# Patient Record
Sex: Female | Born: 1955 | Race: White | Hispanic: No | Marital: Married | Smoking: Former smoker
Health system: Southern US, Community
[De-identification: ages and names within clinical notes are randomized; demographics above are authoritative.]

## PROBLEM LIST (undated history)

## (undated) DIAGNOSIS — M1990 Unspecified osteoarthritis, unspecified site: Secondary | ICD-10-CM

## (undated) DIAGNOSIS — K219 Gastro-esophageal reflux disease without esophagitis: Secondary | ICD-10-CM

## (undated) DIAGNOSIS — M329 Systemic lupus erythematosus, unspecified: Secondary | ICD-10-CM

## (undated) DIAGNOSIS — M797 Fibromyalgia: Secondary | ICD-10-CM

## (undated) DIAGNOSIS — C801 Malignant (primary) neoplasm, unspecified: Secondary | ICD-10-CM

## (undated) DIAGNOSIS — C50919 Malignant neoplasm of unspecified site of unspecified female breast: Secondary | ICD-10-CM

## (undated) DIAGNOSIS — IMO0002 Reserved for concepts with insufficient information to code with codable children: Secondary | ICD-10-CM

## (undated) DIAGNOSIS — I10 Essential (primary) hypertension: Secondary | ICD-10-CM

## (undated) HISTORY — PX: KNEE SURGERY: SHX244

## (undated) HISTORY — DX: Gastro-esophageal reflux disease without esophagitis: K21.9

## (undated) HISTORY — DX: Essential (primary) hypertension: I10

## (undated) HISTORY — PX: COLONOSCOPY: SHX174

## (undated) HISTORY — DX: Malignant (primary) neoplasm, unspecified: C80.1

## (undated) HISTORY — PX: CATARACT EXTRACTION: SUR2

## (undated) HISTORY — DX: Systemic lupus erythematosus, unspecified: M32.9

## (undated) HISTORY — PX: TUBAL LIGATION: SHX77

## (undated) HISTORY — DX: Fibromyalgia: M79.7

## (undated) HISTORY — PX: SHOULDER SURGERY: SHX246

## (undated) HISTORY — DX: Unspecified osteoarthritis, unspecified site: M19.90

## (undated) HISTORY — DX: Reserved for concepts with insufficient information to code with codable children: IMO0002

---

## 2000-11-25 ENCOUNTER — Other Ambulatory Visit: Admission: RE | Admit: 2000-11-25 | Discharge: 2000-11-25 | Payer: Self-pay | Admitting: *Deleted

## 2002-01-04 ENCOUNTER — Other Ambulatory Visit: Admission: RE | Admit: 2002-01-04 | Discharge: 2002-01-04 | Payer: Self-pay | Admitting: *Deleted

## 2003-01-04 ENCOUNTER — Other Ambulatory Visit: Admission: RE | Admit: 2003-01-04 | Discharge: 2003-01-04 | Payer: Self-pay | Admitting: *Deleted

## 2004-01-31 ENCOUNTER — Other Ambulatory Visit: Admission: RE | Admit: 2004-01-31 | Discharge: 2004-01-31 | Payer: Self-pay | Admitting: *Deleted

## 2005-04-14 ENCOUNTER — Other Ambulatory Visit: Admission: RE | Admit: 2005-04-14 | Discharge: 2005-04-14 | Payer: Self-pay | Admitting: *Deleted

## 2006-05-26 ENCOUNTER — Other Ambulatory Visit: Admission: RE | Admit: 2006-05-26 | Discharge: 2006-05-26 | Payer: Self-pay | Admitting: *Deleted

## 2007-07-13 ENCOUNTER — Other Ambulatory Visit: Admission: RE | Admit: 2007-07-13 | Discharge: 2007-07-13 | Payer: Self-pay | Admitting: *Deleted

## 2008-07-04 ENCOUNTER — Other Ambulatory Visit: Admission: RE | Admit: 2008-07-04 | Discharge: 2008-07-04 | Payer: Self-pay | Admitting: Gynecology

## 2009-07-26 ENCOUNTER — Encounter: Admission: RE | Admit: 2009-07-26 | Discharge: 2009-07-26 | Payer: Self-pay | Admitting: Gynecology

## 2010-12-15 HISTORY — PX: BREAST LUMPECTOMY: SHX2

## 2011-01-06 ENCOUNTER — Encounter: Payer: Self-pay | Admitting: Gynecology

## 2011-02-06 ENCOUNTER — Other Ambulatory Visit: Payer: Self-pay | Admitting: Gynecology

## 2011-02-06 DIAGNOSIS — R928 Other abnormal and inconclusive findings on diagnostic imaging of breast: Secondary | ICD-10-CM

## 2011-02-11 ENCOUNTER — Ambulatory Visit
Admission: RE | Admit: 2011-02-11 | Discharge: 2011-02-11 | Disposition: A | Discharge status: Home / Self Care | Payer: 59 | Source: Ambulatory Visit | Attending: Gynecology | Admitting: Gynecology

## 2011-02-11 ENCOUNTER — Other Ambulatory Visit: Payer: Self-pay | Admitting: Gynecology

## 2011-02-11 DIAGNOSIS — R928 Other abnormal and inconclusive findings on diagnostic imaging of breast: Secondary | ICD-10-CM

## 2011-02-17 ENCOUNTER — Ambulatory Visit
Admission: RE | Admit: 2011-02-17 | Discharge: 2011-02-17 | Disposition: A | Discharge status: Home / Self Care | Payer: 59 | Source: Ambulatory Visit | Attending: Gynecology | Admitting: Gynecology

## 2011-02-17 ENCOUNTER — Other Ambulatory Visit: Payer: Self-pay | Admitting: Radiology

## 2011-02-17 ENCOUNTER — Ambulatory Visit
Admission: RE | Admit: 2011-02-17 | Discharge: 2011-02-17 | Disposition: A | Discharge status: Home / Self Care | Payer: 59 | Source: Ambulatory Visit

## 2011-02-17 ENCOUNTER — Other Ambulatory Visit: Payer: Self-pay | Admitting: Gynecology

## 2011-02-17 DIAGNOSIS — R928 Other abnormal and inconclusive findings on diagnostic imaging of breast: Secondary | ICD-10-CM

## 2011-02-17 DIAGNOSIS — N6320 Unspecified lump in the left breast, unspecified quadrant: Secondary | ICD-10-CM

## 2011-02-18 ENCOUNTER — Other Ambulatory Visit: Payer: Self-pay | Admitting: Gynecology

## 2011-02-18 DIAGNOSIS — C50912 Malignant neoplasm of unspecified site of left female breast: Secondary | ICD-10-CM

## 2011-02-20 ENCOUNTER — Other Ambulatory Visit: Payer: Self-pay | Admitting: Gynecology

## 2011-02-20 DIAGNOSIS — N6320 Unspecified lump in the left breast, unspecified quadrant: Secondary | ICD-10-CM

## 2011-02-21 ENCOUNTER — Ambulatory Visit
Admission: RE | Admit: 2011-02-21 | Discharge: 2011-02-21 | Disposition: A | Discharge status: Home / Self Care | Payer: 59 | Source: Ambulatory Visit | Attending: Gynecology | Admitting: Gynecology

## 2011-02-21 DIAGNOSIS — C50912 Malignant neoplasm of unspecified site of left female breast: Secondary | ICD-10-CM

## 2011-02-21 MED ORDER — GADOBENATE DIMEGLUMINE 529 MG/ML IV SOLN
15.0000 mL | Freq: Once | INTRAVENOUS | Status: AC | PRN
  Administered 2011-02-21: 15 mL via INTRAVENOUS

## 2011-02-26 ENCOUNTER — Encounter (HOSPITAL_BASED_OUTPATIENT_CLINIC_OR_DEPARTMENT_OTHER): Payer: 59 | Admitting: Oncology

## 2011-02-26 ENCOUNTER — Other Ambulatory Visit: Payer: Self-pay | Admitting: Oncology

## 2011-02-26 DIAGNOSIS — M329 Systemic lupus erythematosus, unspecified: Secondary | ICD-10-CM

## 2011-02-26 DIAGNOSIS — C50319 Malignant neoplasm of lower-inner quadrant of unspecified female breast: Secondary | ICD-10-CM

## 2011-02-26 DIAGNOSIS — Z79899 Other long term (current) drug therapy: Secondary | ICD-10-CM

## 2011-02-26 DIAGNOSIS — IMO0001 Reserved for inherently not codable concepts without codable children: Secondary | ICD-10-CM

## 2011-02-26 LAB — COMPREHENSIVE METABOLIC PANEL
Albumin: 3.7 g/dL (ref 3.5–5.2)
Alkaline Phosphatase: 73 U/L (ref 39–117)
BUN: 10 mg/dL (ref 6–23)
Calcium: 8.9 mg/dL (ref 8.4–10.5)
Chloride: 103 mEq/L (ref 96–112)
Glucose, Bld: 119 mg/dL — ABNORMAL HIGH (ref 70–99)
Potassium: 3.5 mEq/L (ref 3.5–5.3)

## 2011-02-26 LAB — CBC WITH DIFFERENTIAL/PLATELET
Basophils Absolute: 0 10*3/uL (ref 0.0–0.1)
Eosinophils Absolute: 0 10*3/uL (ref 0.0–0.5)
HGB: 11.8 g/dL (ref 11.6–15.9)
MCV: 82.1 fL (ref 79.5–101.0)
MONO%: 8.7 % (ref 0.0–14.0)
NEUT#: 1.8 10*3/uL (ref 1.5–6.5)
Platelets: 289 10*3/uL (ref 145–400)
RDW: 15.7 % — ABNORMAL HIGH (ref 11.2–14.5)

## 2011-02-26 LAB — CANCER ANTIGEN 27.29: CA 27.29: 16 U/mL (ref 0–39)

## 2011-02-27 ENCOUNTER — Other Ambulatory Visit (HOSPITAL_COMMUNITY): Payer: Self-pay | Admitting: General Surgery

## 2011-02-27 ENCOUNTER — Other Ambulatory Visit: Payer: Self-pay | Admitting: General Surgery

## 2011-02-27 DIAGNOSIS — C50912 Malignant neoplasm of unspecified site of left female breast: Secondary | ICD-10-CM

## 2011-03-06 ENCOUNTER — Other Ambulatory Visit (HOSPITAL_COMMUNITY): Payer: Self-pay | Admitting: General Surgery

## 2011-03-06 ENCOUNTER — Encounter (HOSPITAL_COMMUNITY)
Admission: RE | Admit: 2011-03-06 | Discharge: 2011-03-06 | Disposition: A | Discharge status: Home / Self Care | Payer: 59 | Source: Ambulatory Visit | Attending: General Surgery | Admitting: General Surgery

## 2011-03-06 DIAGNOSIS — C50912 Malignant neoplasm of unspecified site of left female breast: Secondary | ICD-10-CM

## 2011-03-06 LAB — URINALYSIS, ROUTINE W REFLEX MICROSCOPIC
Glucose, UA: NEGATIVE mg/dL
Nitrite: NEGATIVE
pH: 7.5 (ref 5.0–8.0)

## 2011-03-06 LAB — DIFFERENTIAL
Basophils Absolute: 0 10*3/uL (ref 0.0–0.1)
Basophils Relative: 0 % (ref 0–1)
Neutro Abs: 4.1 10*3/uL (ref 1.7–7.7)
Neutrophils Relative %: 60 % (ref 43–77)

## 2011-03-06 LAB — COMPREHENSIVE METABOLIC PANEL
Albumin: 3.9 g/dL (ref 3.5–5.2)
BUN: 11 mg/dL (ref 6–23)
Chloride: 104 mEq/L (ref 96–112)
Creatinine, Ser: 0.98 mg/dL (ref 0.4–1.2)
Glucose, Bld: 95 mg/dL (ref 70–99)
Total Bilirubin: 0.4 mg/dL (ref 0.3–1.2)
Total Protein: 6.6 g/dL (ref 6.0–8.3)

## 2011-03-06 LAB — URINE MICROSCOPIC-ADD ON

## 2011-03-06 LAB — SURGICAL PCR SCREEN
MRSA, PCR: NEGATIVE
Staphylococcus aureus: NEGATIVE

## 2011-03-06 LAB — CBC
Hemoglobin: 11.8 g/dL — ABNORMAL LOW (ref 12.0–15.0)
RBC: 4.37 MIL/uL (ref 3.87–5.11)
WBC: 6.8 10*3/uL (ref 4.0–10.5)

## 2011-03-10 ENCOUNTER — Other Ambulatory Visit: Payer: Self-pay | Admitting: General Surgery

## 2011-03-10 ENCOUNTER — Ambulatory Visit
Admission: RE | Admit: 2011-03-10 | Discharge: 2011-03-10 | Disposition: A | Discharge status: Home / Self Care | Payer: 59 | Source: Ambulatory Visit | Attending: General Surgery | Admitting: General Surgery

## 2011-03-10 ENCOUNTER — Ambulatory Visit (HOSPITAL_COMMUNITY)
Admission: RE | Admit: 2011-03-10 | Discharge: 2011-03-10 | Disposition: A | Discharge status: Home / Self Care | Payer: 59 | Source: Ambulatory Visit | Attending: General Surgery | Admitting: General Surgery

## 2011-03-10 DIAGNOSIS — M6289 Other specified disorders of muscle: Secondary | ICD-10-CM | POA: Insufficient documentation

## 2011-03-10 DIAGNOSIS — C50912 Malignant neoplasm of unspecified site of left female breast: Secondary | ICD-10-CM

## 2011-03-10 DIAGNOSIS — I10 Essential (primary) hypertension: Secondary | ICD-10-CM | POA: Insufficient documentation

## 2011-03-10 DIAGNOSIS — Z01810 Encounter for preprocedural cardiovascular examination: Secondary | ICD-10-CM | POA: Insufficient documentation

## 2011-03-10 DIAGNOSIS — Z87891 Personal history of nicotine dependence: Secondary | ICD-10-CM | POA: Insufficient documentation

## 2011-03-10 DIAGNOSIS — C50319 Malignant neoplasm of lower-inner quadrant of unspecified female breast: Secondary | ICD-10-CM | POA: Insufficient documentation

## 2011-03-10 DIAGNOSIS — M329 Systemic lupus erythematosus, unspecified: Secondary | ICD-10-CM | POA: Insufficient documentation

## 2011-03-10 DIAGNOSIS — E669 Obesity, unspecified: Secondary | ICD-10-CM | POA: Insufficient documentation

## 2011-03-10 DIAGNOSIS — Z01812 Encounter for preprocedural laboratory examination: Secondary | ICD-10-CM | POA: Insufficient documentation

## 2011-03-10 HISTORY — PX: BREAST SURGERY: SHX581

## 2011-03-10 MED ORDER — TECHNETIUM TC 99M SULFUR COLLOID FILTERED
1.0000 | Freq: Once | INTRAVENOUS | Status: AC | PRN
  Administered 2011-03-10: 1 via INTRADERMAL

## 2011-03-11 NOTE — Op Note (Signed)
NAME:  Deborah Levy, Deborah Levy            ACCOUNT NO.:  [id number]  MEDICAL RECORD NO.:  [id number]           PATIENT TYPE:  O  LOCATION:  NUC                          FACILITY:  [facility]  PHYSICIAN:  Angelia Mould. Derrell Lolling, M.D.DATE OF BIRTH:  08/15/1956  DATE OF PROCEDURE:  03/10/2011 DATE OF DISCHARGE:                              OPERATIVE REPORT   PREOPERATIVE DIAGNOSIS:  Invasive lobular carcinoma, left breast, lower inner quadrant, clinical stage T1b N0.  POSTOPERATIVE DIAGNOSIS:  Invasive lobular carcinoma, left breast, lower inner quadrant, clinical stage T1b N0.  OPERATION PERFORMED: 1. Inject blue dye, left breast. 2. Left partial mastectomy with needle localization. 3. Left axillary sentinel lymph node mapping and biopsy.  SURGEON:  Angelia Mould. Derrell Lolling, MD  OPERATIVE INDICATIONS:  This is a 55 year old Caucasian female who had recent screening mammograms and subsequent other x-rays.  She was found to have a 6-mm density in the left breast in the lower inner quadrant. Image-guided biopsy showed invasive mammary carcinoma with lobular features, ER 97%, PR 59%, Ki-67 of 12%, HER2 negative.  Subsequent MRI showed a 9-mm density in the left breast, lower inner quadrant and this was a solitary finding.  She has been evaluated by me as well as Dr. Darnelle Catalan and Dr. Roselind Messier.  It is felt that she most likely has stage I lobular carcinoma and was felt to be a good candidate for breast conservation.  That was her desire.  It was our recommendation that she go ahead with surgery as the first step and she is brought to the operating room electively for that.  OPERATIVE TECHNIQUE:  The patient underwent wire localization by Dr. Cain Saupe at the [facility] this morning.  The wire entered the left breast at about the 6:30 position and was directed laterally through the marker clip where the cancer is at about the 8 o'clock position.  The patient underwent injection of  radionuclide by the nuclear medicine technician in the holding area.  The patient was brought to the operating room.  General anesthesia was induced. Following an alcohol prep, I injected 5 mL of blue dye into the left breast subareolar area.  This was 2 mL of methylene blue mixed with 3 mL of saline.  The breast was massaged for 5 minutes.  Intravenous antibiotics were given.  The patient was identified as correct patient, correct procedure, and correct site.  The left breast and left axilla were prepped and draped in a sterile fashion.  A marking pen was used to mark a breast incision.  I chose to make a very thin radial ellipse at about the 8 o'clock position of the left breast.  This incision was made and dissection was carried down into the breast tissue around the localizing wire.  We got completely around the wire without too much difficulty.  The specimen was removed and marked with the 6-color margin marker kit.  Specimen mammogram showed the marker was in the center of the specimen and looked good. The specimen was sent to pathology.  The wound was irrigated with saline.  Hemostasis was excellent and achieved with electrocautery.  I closed  the breast tissue carefully in 3 layers, 2 layers of interrupted 3-0 Vicryl and the skin was closed with a running subcuticular suture of 4-0 Monocryl and Dermabond.  Attention was directed to the left axilla.  The NeoProbe was used and we could hear the radioactivity in the axilla.  A transverse incision was made at the hairline.  Dissection was carried down through subcutaneous tissue.  The clavipectoral fascia was incised and we entered the axilla. We traced out 2 sentinel lymph nodes.  Both were very hot with radioactivity but only one had blue dye in them.  Once these were removed, there was almost no radioactivity left and no palpable lymph nodes left.  These were sent for routine histology.  The wound was irrigated with saline.   Hemostasis was excellent.  The deeper tissues were closed with interrupted sutures of 3-0 Vicryl and the skin closed with a running subcuticular suture of 4-0 Monocryl and Dermabond.  The patient tolerated the procedure well and was taken to the recovery room in stable condition.  Estimated blood loss was 20 mL or less. Complications were none.  Sponge, needle, and instrument counts were correct.     Angelia Mould. Derrell Lolling, M.D.     HMI/MEDQ  D:  03/10/2011  T:  03/11/2011  Job:  [id number]  cc:   Stoney Bang, MD Valentino Hue. Magrinat, M.D. Billie Lade, Ph.D., M.D. Aundra Dubin, M.D. Leatha Gilding. Mezer, M.D. Nolon Nations, MD  Electronically Signed by Claud Kelp M.D. on 03/11/2011 09:48:28 AM

## 2011-03-19 ENCOUNTER — Ambulatory Visit: Payer: 59 | Attending: Radiation Oncology | Admitting: Radiation Oncology

## 2011-03-19 DIAGNOSIS — L988 Other specified disorders of the skin and subcutaneous tissue: Secondary | ICD-10-CM | POA: Insufficient documentation

## 2011-03-19 DIAGNOSIS — Y842 Radiological procedure and radiotherapy as the cause of abnormal reaction of the patient, or of later complication, without mention of misadventure at the time of the procedure: Secondary | ICD-10-CM | POA: Insufficient documentation

## 2011-03-19 DIAGNOSIS — Z510 Encounter for antineoplastic radiation therapy: Secondary | ICD-10-CM | POA: Insufficient documentation

## 2011-03-19 DIAGNOSIS — Z170 Estrogen receptor positive status [ER+]: Secondary | ICD-10-CM | POA: Insufficient documentation

## 2011-03-19 DIAGNOSIS — IMO0001 Reserved for inherently not codable concepts without codable children: Secondary | ICD-10-CM | POA: Insufficient documentation

## 2011-03-19 DIAGNOSIS — C50919 Malignant neoplasm of unspecified site of unspecified female breast: Secondary | ICD-10-CM | POA: Insufficient documentation

## 2011-03-21 ENCOUNTER — Other Ambulatory Visit: Payer: Self-pay | Admitting: Rheumatology

## 2011-03-21 DIAGNOSIS — G129 Spinal muscular atrophy, unspecified: Secondary | ICD-10-CM

## 2011-03-24 ENCOUNTER — Ambulatory Visit: Payer: 59 | Admitting: Radiation Oncology

## 2011-04-23 ENCOUNTER — Encounter (INDEPENDENT_AMBULATORY_CARE_PROVIDER_SITE_OTHER): Payer: Self-pay | Admitting: General Surgery

## 2011-05-07 ENCOUNTER — Encounter (HOSPITAL_BASED_OUTPATIENT_CLINIC_OR_DEPARTMENT_OTHER): Payer: 59 | Admitting: Oncology

## 2011-05-07 DIAGNOSIS — Z170 Estrogen receptor positive status [ER+]: Secondary | ICD-10-CM

## 2011-05-07 DIAGNOSIS — IMO0001 Reserved for inherently not codable concepts without codable children: Secondary | ICD-10-CM

## 2011-05-07 DIAGNOSIS — C50319 Malignant neoplasm of lower-inner quadrant of unspecified female breast: Secondary | ICD-10-CM

## 2011-07-07 ENCOUNTER — Ambulatory Visit
Admission: RE | Admit: 2011-07-07 | Discharge: 2011-07-07 | Disposition: A | Discharge status: Home / Self Care | Payer: 59 | Source: Ambulatory Visit | Attending: Radiation Oncology | Admitting: Radiation Oncology

## 2011-09-04 ENCOUNTER — Encounter (HOSPITAL_BASED_OUTPATIENT_CLINIC_OR_DEPARTMENT_OTHER): Payer: 59 | Admitting: Oncology

## 2011-09-04 DIAGNOSIS — Z79899 Other long term (current) drug therapy: Secondary | ICD-10-CM

## 2011-09-04 DIAGNOSIS — IMO0001 Reserved for inherently not codable concepts without codable children: Secondary | ICD-10-CM

## 2011-09-04 DIAGNOSIS — M329 Systemic lupus erythematosus, unspecified: Secondary | ICD-10-CM

## 2011-09-04 DIAGNOSIS — C50319 Malignant neoplasm of lower-inner quadrant of unspecified female breast: Secondary | ICD-10-CM

## 2011-09-04 LAB — CBC WITH DIFFERENTIAL/PLATELET
BASO%: 0.7 % (ref 0.0–2.0)
Basophils Absolute: 0 10*3/uL (ref 0.0–0.1)
Eosinophils Absolute: 0 10*3/uL (ref 0.0–0.5)
HCT: 39.4 % (ref 34.8–46.6)
HGB: 13.3 g/dL (ref 11.6–15.9)
LYMPH%: 27.9 % (ref 14.0–49.7)
MCHC: 33.8 g/dL (ref 31.5–36.0)
MONO#: 0.6 10*3/uL (ref 0.1–0.9)
NEUT%: 61 % (ref 38.4–76.8)
Platelets: 393 10*3/uL (ref 145–400)
WBC: 6.1 10*3/uL (ref 3.9–10.3)
lymph#: 1.7 10*3/uL (ref 0.9–3.3)

## 2011-09-04 LAB — COMPREHENSIVE METABOLIC PANEL
ALT: 15 U/L (ref 0–35)
BUN: 13 mg/dL (ref 6–23)
CO2: 26 mEq/L (ref 19–32)
Calcium: 9.5 mg/dL (ref 8.4–10.5)
Chloride: 99 mEq/L (ref 96–112)
Creatinine, Ser: 0.78 mg/dL (ref 0.50–1.10)
Glucose, Bld: 90 mg/dL (ref 70–99)
Total Bilirubin: 0.1 mg/dL — ABNORMAL LOW (ref 0.3–1.2)

## 2011-09-04 LAB — CANCER ANTIGEN 27.29: CA 27.29: 24 U/mL (ref 0–39)

## 2011-09-11 ENCOUNTER — Encounter: Payer: 59 | Admitting: Oncology

## 2011-09-11 DIAGNOSIS — C50319 Malignant neoplasm of lower-inner quadrant of unspecified female breast: Secondary | ICD-10-CM

## 2011-09-11 DIAGNOSIS — IMO0001 Reserved for inherently not codable concepts without codable children: Secondary | ICD-10-CM

## 2011-09-11 DIAGNOSIS — Z170 Estrogen receptor positive status [ER+]: Secondary | ICD-10-CM

## 2011-09-11 DIAGNOSIS — Z923 Personal history of irradiation: Secondary | ICD-10-CM

## 2012-01-12 ENCOUNTER — Ambulatory Visit
Admission: RE | Admit: 2012-01-12 | Discharge: 2012-01-12 | Disposition: A | Discharge status: Home / Self Care | Payer: 59 | Source: Ambulatory Visit | Attending: Radiation Oncology | Admitting: Radiation Oncology

## 2012-01-12 VITALS — BP 133/89 | HR 108 | Temp 97.5°F | Wt 180.3 lb

## 2012-01-12 DIAGNOSIS — C50912 Malignant neoplasm of unspecified site of left female breast: Secondary | ICD-10-CM

## 2012-01-12 DIAGNOSIS — C50312 Malignant neoplasm of lower-inner quadrant of left female breast: Secondary | ICD-10-CM

## 2012-01-12 NOTE — Progress Notes (Signed)
CC:   Angelia Mould. Derrell Lolling, M.D. Lowella Dell, M.D. Aundra Dubin, M.D.  FOLLOWUP NOTE  DIAGNOSIS:  Left breast cancer.  INTERVAL SINCE RADIATION THERAPY:  7 months.  NARRATIVE:  Deborah Levy comes in today for routine followup.  She completed breast conservation therapy back in June 2012.  The patient denies any pain within the breast area, nipple discharge or bleeding. She continues on tamoxifen and seems to be tolerating this well.  The patient has been taken off of her Plaquenil as her rheumatologist Dr. Kellie Simmering, feels overall this disease process is better.  The patient denies any more episodes of severe pruritus.  The patient denies any problems with swelling in her left arm or hand.  PHYSICAL EXAMINATION:  Vital Signs:  The patient's temperature is 97.5, pulse is 108, blood pressure is 133/89.  Weight is 180.3 pounds.  The patient is accompanied by her husband on evaluation today.  Examination of the neck and supraclavicular region reveals no evidence of adenopathy.  The axillary areas are free of adenopathy.  Examination of the lungs reveals them to be clear.  The heart has a regular rhythm and rate.  The abdomen is soft and nontender with normal bowel sounds. Examination of the right breast reveals no mass or nipple discharge. Examination left breast reveals very mild hyperpigmentation changes. There is no dominant mass appreciated in the breast, nipple discharge or bleeding.  The patient has some mild edema in the nipple-areolar complex area on lying flat.  IMPRESSION AND PLAN:  Clinically no evidence of disease.  In light of the patient's close followup with Dr. Darnelle Catalan, I have not scheduled Mrs. Murrell for formal followup appointment but would be glad to see her at any time.    ______________________________ Billie Lade, Ph.D., M.D. JDK/MEDQ  D:  01/12/2012  T:  01/12/2012  Job:  [id number]

## 2012-01-12 NOTE — Progress Notes (Signed)
Here for follow up 7 mths post radiation  To left breast.Has occasional discoloration under left mammary fold. No mammogram since surgery.

## 2012-02-13 ENCOUNTER — Telehealth: Payer: Self-pay | Admitting: Oncology

## 2012-02-13 NOTE — Telephone Encounter (Signed)
Called pt ,left message regarding lab and ML visit on march 2013

## 2012-02-23 ENCOUNTER — Encounter (INDEPENDENT_AMBULATORY_CARE_PROVIDER_SITE_OTHER): Payer: Self-pay | Admitting: General Surgery

## 2012-02-23 ENCOUNTER — Ambulatory Visit (INDEPENDENT_AMBULATORY_CARE_PROVIDER_SITE_OTHER): Payer: 59 | Admitting: General Surgery

## 2012-02-23 VITALS — BP 120/76 | HR 108 | Temp 97.2°F | Ht 59.5 in | Wt 180.4 lb

## 2012-02-23 DIAGNOSIS — C50919 Malignant neoplasm of unspecified site of unspecified female breast: Secondary | ICD-10-CM

## 2012-02-23 DIAGNOSIS — C50912 Malignant neoplasm of unspecified site of left female breast: Secondary | ICD-10-CM

## 2012-02-23 NOTE — Progress Notes (Signed)
Patient ID: Deborah Levy, female   DOB: Feb 21, 1956, 56 y.o.   MRN: [id number]  Chief Complaint  Patient presents with  . Follow-up    br ck after mgm     HPI Deborah Levy is a 56 y.o. female.  She returns for long-term followup regarding her left breast cancer, lower inner quadrant.  On March 10, 2011 the patient underwent left partial mastectomy and sentinel node biopsy for breast cancer in the lower inner quadrant of the left breast. Pathologic stage T1a., N0, invasive lobular carcinoma, 0.5 cm, zero out of two nodes positive. Receptors strongly positive, Ki-67 12%, HER-2 negative.  She had adjuvant radiation therapy. Dr. Darnelle Catalan put her on tamoxifen and is following her periodically.  She has no complaints about her breast. She feels no nodules or painful areas. Occasional brief neuropathic pain is reported.  She has not had mammograms since her surgery and she knows that she is due for that.  She is still contemplating reduction mammoplasty on the right as a symmetry procedure but she has not sought plastic surgical consultation yet. HPI  Past Medical History  Diagnosis Date  . Lupus   . Fibromyalgia   . Hypertension   . Cancer   . GERD (gastroesophageal reflux disease)     Past Surgical History  Procedure Date  . Shoulder surgery   . Knee surgery   . Tubal ligation   . Breast surgery 03/10/2011    Lt br lumpectomy    Family History  Problem Relation Age of Onset  . Cancer Father     colon  . Hypertension Father   . Heart disease Father   . Cancer Sister     Cervical  . Heart disease Maternal Grandfather   . Hypertension Paternal Grandmother   . Heart disease Paternal Grandmother     Social History History  Substance Use Topics  . Smoking status: Former Games developer  . Smokeless tobacco: Former Neurosurgeon    Quit date: 02/22/1997  . Alcohol Use: No    Allergies  Allergen Reactions  . Sulfa Antibiotics Nausea Only    Current Outpatient Prescriptions    Medication Sig Dispense Refill  . aspirin 81 MG tablet Take 81 mg by mouth daily.      . Biotin 5000 MCG CAPS Take 1 capsule by mouth daily.        . Calcium Carbonate-Vitamin D (CALCIUM 600 + D PO) Take 1 tablet by mouth 2 (two) times daily.        Marland Kitchen docusate sodium (STOOL SOFTENER) 100 MG capsule Take 100 mg by mouth daily.        Marland Kitchen ezetimibe (ZETIA) 10 MG tablet Take 10 mg by mouth daily.      . fexofenadine (ALLEGRA) 60 MG tablet Take 60 mg by mouth 2 (two) times daily.        . hydrOXYzine (ATARAX) 25 MG tablet Take 25 mg by mouth 2 (two) times daily.        Marland Kitchen lisinopril (PRINIVIL,ZESTRIL) 10 MG tablet Take 10 mg by mouth daily.        . magnesium oxide (MAG-OX) 400 MG tablet Take 400 mg by mouth 2 (two) times daily.        . Niacin-Simvastatin [facility]) 1000-40 MG TB24 Take 1 tablet by mouth daily.        Marland Kitchen omeprazole (PRILOSEC) 40 MG capsule       . pregabalin (LYRICA) 100 MG capsule Take 100 mg by mouth.      Marland Kitchen  simvastatin (ZOCOR) 40 MG tablet       . tamoxifen (NOLVADEX) 20 MG tablet       . venlafaxine (EFFEXOR-XR) 37.5 MG 24 hr capsule       . zolpidem (AMBIEN) 10 MG tablet Take 10 mg by mouth at bedtime.          Review of Systems Review of Systems  Constitutional: Negative for fever, chills and unexpected weight change.  HENT: Negative for hearing loss, congestion, sore throat, trouble swallowing and voice change.   Eyes: Negative for visual disturbance.  Respiratory: Negative for cough and wheezing.   Cardiovascular: Negative for chest pain, palpitations and leg swelling.  Gastrointestinal: Negative for nausea, vomiting, abdominal pain, diarrhea, constipation, blood in stool, abdominal distention and anal bleeding.  Genitourinary: Negative for hematuria, vaginal bleeding and difficulty urinating.  Musculoskeletal: Negative for arthralgias.  Skin: Negative for rash and wound.  Neurological: Negative for seizures, syncope and headaches.  Hematological: Negative for  adenopathy. Does not bruise/bleed easily.  Psychiatric/Behavioral: Negative for confusion.    Blood pressure 120/76, pulse 108, temperature 97.2 F (36.2 C), temperature source Temporal, height 4' 11.5" (1.511 m), weight 180 lb 6.4 oz (81.829 kg), SpO2 97.00%.  Physical Exam Physical Exam  Constitutional: She is oriented to person, place, and time. She appears well-developed and well-nourished. No distress.  HENT:  Head: Atraumatic.  Eyes: EOM are normal.  Neck: Neck supple. No JVD present. No tracheal deviation present. No thyromegaly present.  Cardiovascular: Normal rate, regular rhythm, normal heart sounds and intact distal pulses.   No murmur heard. Pulmonary/Chest: Effort normal and breath sounds normal. No respiratory distress. She has no wheezes. She has no rales. She exhibits no tenderness.    Abdominal: Bowel sounds are normal.  Musculoskeletal: She exhibits no edema and no tenderness.  Lymphadenopathy:    She has no cervical adenopathy.  Neurological: She is alert and oriented to person, place, and time. She exhibits normal muscle tone. Coordination normal.  Skin: Skin is warm. No rash noted. She is not diaphoretic. No erythema. No pallor.  Psychiatric: She has a normal mood and affect. Her behavior is normal. Judgment and thought content normal.    Data Reviewed I reviewed all of my old records and I reviewed the records from the [facility]. She has been scheduled for mammograms.  Assessment    Invasive lobular carcinoma left breast, lower inner quadrant, pathologic stage T1a, , N0, receptor positive, HER-2 negative, Ki67 12%.  No evidence of recurrence one year following left partial mastectomy and sentinel biopsy, adjuvant radiation therapy and ongoing adjuvant tamoxifen.  Mild asymmetry with left breast smaller than right.    Plan    We discussed symmetry procedures. Specifically, she is interested in  reduction mammoplasty on the right. She was  not willing for me to go ahead and make a referral, but I gave her the names of the plastic surgeons to do this type of surgery and encouraged to call for a consultative visit.  She'll be scheduled for bilateral diagnostic mammograms now.  Bilateral diagnostic mammograms will be repeated in one year, and she will see me at that time.  Continue tamoxifen and followup with Dr. Darnelle Catalan.       Angelia Mould. Derrell Lolling, M.D., [facility] General and Minimally invasive Surgery Breast and Colorectal Surgery Office:   [phone] Pager:   [phone]  02/23/2012, 4:14 PM

## 2012-02-23 NOTE — Patient Instructions (Signed)
Your breast exam today is normal. There is no evidence of recurrent cancer anywhere.  You have been given the names of plastic surgeons who do symmetry procedures for breast cancer. I would encourage you to think about this and call and make an appointment when you feel ready to do this.  You'll be scheduled for bilateral mammograms now, since it has been more than one year since your last mammograms.  Return to see Dr. Derrell Lolling in 13 months after you get mammograms in March of 2014.

## 2012-03-05 ENCOUNTER — Other Ambulatory Visit: Payer: 59 | Admitting: Lab

## 2012-03-05 ENCOUNTER — Telehealth (INDEPENDENT_AMBULATORY_CARE_PROVIDER_SITE_OTHER): Payer: Self-pay

## 2012-03-05 DIAGNOSIS — IMO0001 Reserved for inherently not codable concepts without codable children: Secondary | ICD-10-CM

## 2012-03-05 DIAGNOSIS — C50319 Malignant neoplasm of lower-inner quadrant of unspecified female breast: Secondary | ICD-10-CM

## 2012-03-05 DIAGNOSIS — Z170 Estrogen receptor positive status [ER+]: Secondary | ICD-10-CM

## 2012-03-05 DIAGNOSIS — M329 Systemic lupus erythematosus, unspecified: Secondary | ICD-10-CM

## 2012-03-05 LAB — COMPREHENSIVE METABOLIC PANEL
ALT: 13 U/L (ref 0–35)
AST: 19 U/L (ref 0–37)
Albumin: 4 g/dL (ref 3.5–5.2)
BUN: 15 mg/dL (ref 6–23)
CO2: 27 mEq/L (ref 19–32)
Calcium: 9.1 mg/dL (ref 8.4–10.5)
Chloride: 104 mEq/L (ref 96–112)
Potassium: 4 mEq/L (ref 3.5–5.3)

## 2012-03-05 LAB — CBC WITH DIFFERENTIAL/PLATELET
BASO%: 0.5 % (ref 0.0–2.0)
Basophils Absolute: 0 10*3/uL (ref 0.0–0.1)
EOS%: 1.1 % (ref 0.0–7.0)
HCT: 36.2 % (ref 34.8–46.6)
HGB: 12.5 g/dL (ref 11.6–15.9)
MCH: 29.1 pg (ref 25.1–34.0)
MONO#: 0.3 10*3/uL (ref 0.1–0.9)
RDW: 13.9 % (ref 11.2–14.5)
WBC: 3.4 10*3/uL — ABNORMAL LOW (ref 3.9–10.3)
lymph#: 1.1 10*3/uL (ref 0.9–3.3)

## 2012-03-05 LAB — CANCER ANTIGEN 27.29: CA 27.29: 24 U/mL (ref 0–39)

## 2012-03-05 NOTE — Telephone Encounter (Signed)
I noted in epic that pt did not go for mgm 3-19. I called pt and she states she forgot the appt. I gave her the # to [facility] and pt states she will call to r/s appt.

## 2012-03-10 ENCOUNTER — Encounter: Payer: Self-pay | Admitting: Physician Assistant

## 2012-03-10 ENCOUNTER — Ambulatory Visit (HOSPITAL_BASED_OUTPATIENT_CLINIC_OR_DEPARTMENT_OTHER): Payer: 59 | Admitting: Physician Assistant

## 2012-03-10 ENCOUNTER — Telehealth: Payer: Self-pay | Admitting: *Deleted

## 2012-03-10 VITALS — BP 145/92 | HR 87 | Temp 99.0°F | Ht 59.5 in | Wt 179.4 lb

## 2012-03-10 DIAGNOSIS — Z79810 Long term (current) use of selective estrogen receptor modulators (SERMs): Secondary | ICD-10-CM

## 2012-03-10 DIAGNOSIS — IMO0001 Reserved for inherently not codable concepts without codable children: Secondary | ICD-10-CM

## 2012-03-10 DIAGNOSIS — C50919 Malignant neoplasm of unspecified site of unspecified female breast: Secondary | ICD-10-CM

## 2012-03-10 DIAGNOSIS — C50912 Malignant neoplasm of unspecified site of left female breast: Secondary | ICD-10-CM

## 2012-03-10 DIAGNOSIS — Z170 Estrogen receptor positive status [ER+]: Secondary | ICD-10-CM

## 2012-03-10 MED ORDER — TAMOXIFEN CITRATE 20 MG PO TABS: 20.0000 mg | ORAL_TABLET | Freq: Every day | ORAL | Status: DC

## 2012-03-10 MED ORDER — VENLAFAXINE HCL ER 37.5 MG PO CP24: 75.0000 mg | ORAL_CAPSULE | Freq: Every day | ORAL | Status: DC

## 2012-03-10 NOTE — Telephone Encounter (Signed)
patient called in and confirmed new date and time

## 2012-03-10 NOTE — Progress Notes (Signed)
ID: Deborah Levy   DOB: Apr 25, 1956  MR#: [id number]  WUJ#:[id number]  HISTORY OF PRESENT ILLNESS: [facility], which showed a possible asymmetry in the left breast.  She was recalled for additional views July 26, 2009 and these were felt to be most likely benign findings, as they appeared essentially unchanged as compared to mammography from 2007.  However, she was set up for six-month followup and on February 11, 2011, with repeat left mammogram and ultrasound, Dr. Deboraha Levy was able to locate a small spiculated mass in the left lower inner quadrant posteriorly, it was not palpable by physical exam, but ultrasound did show an irregularly marginated solid mass measuring a maximum of 6 mm.  The axilla was negative.  This was felt sufficiently suspicious that the patient was called back for biopsy and this was performed on March 5.  The pathology [id number]) showed an invasive lobular carcinoma grade 2 with no HER2/neu amplification, but strongly estrogen and progesterone receptor positive at 97% and 59% respectively.  The MIB-1 was low at 12%.  With this information the patient was referred for bilateral breast MRIs, and these were performed March 9.  The MRI confirmed a mass measuring 9 mm in the lower inner quadrant of the left breast, irregular and enhancing.  There were no other masses and no suspicious lymph nodes.    The patient underwent left lumpectomy and sentinel lymph node sampling in March 2012 for her invasive lobular breast carcinoma, T1 a N0, grade 1. Tumor was strongly ER and PR positive, HER-2/neu negative, with an MIB-1 of 12%. Margins were ample.  Patient received radiation therapy which was completed in June of 2012. She began on tamoxifen in July of 2012 and continues with good tolerance.  INTERVAL HISTORY: Deborah Levy returns today for routine six-month followup of her left breast carcinoma. Interval history is generally  unremarkable, and she is tolerating the tamoxifen well. She continues to have hot flashes, and in fact this is her biggest complaint today. She is currently on venlafaxine Bexxar 37.5 mg daily. The hot flashes occur both during the day and at night.  REVIEW OF SYSTEMS: Deborah Levy continues to complain of fatigue, but she attributes this more to her fibromyalgia than the breast cancer which is likely accurate. She sometimes has difficulty sleeping. She has some occasional shooting pains in the surgical area of the left breast. No additional pain elsewhere. No abnormal headaches or dizziness. She feels a little forgetful at times. She's had no recent illnesses, and denies any fevers, chills, or night sweats.  A detailed review of systems is otherwise noncontributory.   PAST MEDICAL HISTORY: Past Medical History  Diagnosis Date  . Lupus   . Fibromyalgia   . Hypertension   . Cancer   . GERD (gastroesophageal reflux disease)     PAST SURGICAL HISTORY: Past Surgical History  Procedure Date  . Shoulder surgery   . Knee surgery   . Tubal ligation   . Breast surgery 03/10/2011    Lt br lumpectomy    FAMILY HISTORY Family History  Problem Relation Age of Onset  . Cancer Father     colon  . Hypertension Father   . Heart disease Father   . Cancer Sister     Cervical  . Heart disease Maternal Grandfather   . Hypertension Paternal Grandmother   . Heart disease Paternal Grandmother     GYNECOLOGIC HISTORY:  She had  menarche in her teens.  Last menstrual period about age 62.  She did use hormone replacement, did so for about 15 years and stopped with her breast cancer diagnosis. She was 56 years old when she birthed her first child.  She is Gx, P1.  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  She has been married eight years to [employer], who works at a Dance movement psychotherapist.  The patient's daughter from a prior marriage is Deborah Levy 36, lives in [place] and works as  a Metallurgist.  The patient has two step children.  They are Deborah Levy's children from a prior marriage.  One of them is Research scientist (physical sciences) who works as a Engineer, civil (consulting) with Dr. Felipa Eth here in [place] and is 58 years old and Deborah Levy who lives in [place], is 25, and works in Psychologist, educational.  They have no grandchildren.  The patient is not a church attender.   ADVANCED DIRECTIVES:  HEALTH MAINTENANCE: History  Substance Use Topics  . Smoking status: Former Games developer  . Smokeless tobacco: Former Neurosurgeon    Quit date: 02/22/1997  . Alcohol Use: No     Colonoscopy:  PAP:  Bone density:  Lipid panel:  Allergies  Allergen Reactions  . Sulfa Antibiotics Nausea Only    Current Outpatient Prescriptions  Medication Sig Dispense Refill  . aspirin 81 MG tablet Take 81 mg by mouth daily.      . Biotin 5000 MCG CAPS Take 1 capsule by mouth daily.        . Calcium Carbonate-Vitamin D (CALCIUM 600 + D PO) Take 1 tablet by mouth 2 (two) times daily.        . cholecalciferol (VITAMIN D) 400 UNITS TABS Take 2,000 Units by mouth daily.      Marland Kitchen docusate sodium (STOOL SOFTENER) 100 MG capsule Take 100 mg by mouth daily.        Marland Kitchen ezetimibe (ZETIA) 10 MG tablet Take 10 mg by mouth daily.      Marland Kitchen lisinopril (PRINIVIL,ZESTRIL) 10 MG tablet Take 10 mg by mouth daily.        . magnesium oxide (MAG-OX) 400 MG tablet Take 400 mg by mouth 2 (two) times daily.        Marland Kitchen omeprazole (PRILOSEC) 40 MG capsule       . phentermine 37.5 MG capsule Take 37.5 mg by mouth every morning.      . pregabalin (LYRICA) 100 MG capsule Take 100 mg by mouth.      . simvastatin (ZOCOR) 40 MG tablet       . tamoxifen (NOLVADEX) 20 MG tablet Take 1 tablet (20 mg total) by mouth daily.  90 tablet  3  . venlafaxine (EFFEXOR-XR) 37.5 MG 24 hr capsule Take 2 capsules (75 mg total) by mouth daily.  30 capsule  5  . zolpidem (AMBIEN) 10 MG tablet Take 10 mg by mouth at bedtime.        . fexofenadine (ALLEGRA) 60 MG tablet Take 60 mg by mouth 2 (two) times  daily.        . hydrOXYzine (ATARAX) 25 MG tablet Take 25 mg by mouth 2 (two) times daily.        . Niacin-Simvastatin [facility]) 1000-40 MG TB24 Take 1 tablet by mouth daily.          OBJECTIVE: Filed Vitals:   03/10/12 1403  BP: 145/92  Pulse: 87  Temp: 99 F (37.2 C)     Body mass index is 35.63  kg/(m^2).    ECOG FS: 0  Sclerae unicteric Oropharynx clear No peripheral adenopathy Lungs no rales or rhonchi, clear to auscultation bilaterally  Heart regular rate and rhythm, no murmurs or drops  Abd benign, soft, nontender, with positive bowel sounds Skin is benign  MSK no focal spinal tenderness, no peripheral edema Neuro: nonfocal Breasts: Right breast is unremarkable, no masses, skin changes, or nipple inversion. Left breast is status post lumpectomy. No suspicious nodularity or skin changes, and no evidence of local recurrence.   LAB RESULTS: Lab Results  Component Value Date   WBC 3.4* 03/05/2012   NEUTROABS 1.9 03/05/2012   HGB 12.5 03/05/2012   HCT 36.2 03/05/2012   MCV 84.4 03/05/2012   PLT 258 03/05/2012      Chemistry      Component Value Date/Time   NA 140 03/05/2012 1107   K 4.0 03/05/2012 1107   CL 104 03/05/2012 1107   CO2 27 03/05/2012 1107   BUN 15 03/05/2012 1107   CREATININE 0.96 03/05/2012 1107      Component Value Date/Time   CALCIUM 9.1 03/05/2012 1107   ALKPHOS 67 03/05/2012 1107   AST 19 03/05/2012 1107   ALT 13 03/05/2012 1107   BILITOT 0.3 03/05/2012 1107       Lab Results  Component Value Date   LABCA2 24 03/05/2012     STUDIES: Patient is scheduled for her next mammogram tomorrow, 03/11/2012.   ASSESSMENT: A 56 year old [place] woman with a history of invasive lobular breast cancer   (1)  status post left lumpectomy and sentinel lymph node sampling March 2012 for a T1A N0 grade 1 tumor strongly estrogen and progesterone receptor positive, with an MIB-1 of 12%, no HER-2 amplification and ample margins,   (2)  status post radiation completed  in June 2012,   (3)  on tamoxifen since July 2012 with good tolerance, the plan being to continue for 5 years.  (4)  comorbidities include fibromyalgia and discoid lupus.    PLAN: With regards to her breast cancer, Deborah Levy appears to be doing well, and there is no clinical evidence of disease recurrence. She will continue on the tamoxifen which I have refilled for her today for another year. Again the goal be to continue for a total of 5 years. We are increasing her Effexor XR from 37.5-75 mg daily to see if this helps her hot flashes. If not, I have asked her to contact us for further evaluation.  Deborah Levy will return for routine followup in 6 months, but knows to call prior that time with any changes or problems.  Since she had a lobular breast cancer, we should consider MRI of the breast every 2 years.  Her next one therefore would be in March 2014.     Deborah Levy    03/10/2012

## 2012-03-10 NOTE — Telephone Encounter (Signed)
gave patient appointment for 08-2012 printed out calendar and gave to the patient 

## 2012-03-11 ENCOUNTER — Ambulatory Visit
Admission: RE | Admit: 2012-03-11 | Discharge: 2012-03-11 | Disposition: A | Discharge status: Home / Self Care | Payer: 59 | Source: Ambulatory Visit | Attending: General Surgery | Admitting: General Surgery

## 2012-03-11 DIAGNOSIS — C50912 Malignant neoplasm of unspecified site of left female breast: Secondary | ICD-10-CM

## 2012-04-06 ENCOUNTER — Other Ambulatory Visit: Payer: Self-pay | Admitting: Gynecology

## 2012-05-03 ENCOUNTER — Telehealth: Payer: Self-pay | Admitting: *Deleted

## 2012-05-03 NOTE — Telephone Encounter (Signed)
Pt. Calls to say the venlafaxine that Amy Allyson Sabal PA gave her for hot flashes is not working at all and her hot flashes are very problematic for her.  She would like to see if Amy could give her something else.  She uses [facility] on [address]  Pt. Call back is [phone]

## 2012-05-04 ENCOUNTER — Telehealth (INDEPENDENT_AMBULATORY_CARE_PROVIDER_SITE_OTHER): Payer: Self-pay

## 2012-05-04 NOTE — Telephone Encounter (Signed)
I returned pts call. Pt advised of Dr Odis Luster and Dr Kelly Splinter for plastic MDs. Pt will check with her insurance to see which MD is with her plan.

## 2012-05-05 ENCOUNTER — Other Ambulatory Visit: Payer: Self-pay | Admitting: *Deleted

## 2012-05-05 MED ORDER — MEGESTROL ACETATE 40 MG PO TABS: ORAL_TABLET | ORAL | Status: DC

## 2012-05-05 NOTE — Telephone Encounter (Signed)
This RN spoke with pt per prescription for hot flashes.

## 2012-08-24 ENCOUNTER — Other Ambulatory Visit: Payer: Self-pay | Admitting: *Deleted

## 2012-08-24 DIAGNOSIS — C50319 Malignant neoplasm of lower-inner quadrant of unspecified female breast: Secondary | ICD-10-CM

## 2012-08-24 MED ORDER — MEGESTROL ACETATE 40 MG PO TABS: ORAL_TABLET | ORAL | Status: DC

## 2012-09-06 ENCOUNTER — Other Ambulatory Visit (HOSPITAL_BASED_OUTPATIENT_CLINIC_OR_DEPARTMENT_OTHER): Payer: BC Managed Care – PPO | Admitting: Lab

## 2012-09-06 DIAGNOSIS — C50919 Malignant neoplasm of unspecified site of unspecified female breast: Secondary | ICD-10-CM

## 2012-09-06 DIAGNOSIS — C50912 Malignant neoplasm of unspecified site of left female breast: Secondary | ICD-10-CM

## 2012-09-06 LAB — COMPREHENSIVE METABOLIC PANEL (CC13)
AST: 19 U/L (ref 5–34)
Albumin: 3.5 g/dL (ref 3.5–5.0)
Alkaline Phosphatase: 56 U/L (ref 40–150)
Potassium: 3.6 mEq/L (ref 3.5–5.1)
Sodium: 139 mEq/L (ref 136–145)
Total Bilirubin: 0.2 mg/dL (ref 0.20–1.20)
Total Protein: 6.5 g/dL (ref 6.4–8.3)

## 2012-09-06 LAB — CBC WITH DIFFERENTIAL/PLATELET
BASO%: 0.9 % (ref 0.0–2.0)
EOS%: 1.3 % (ref 0.0–7.0)
HGB: 11.9 g/dL (ref 11.6–15.9)
MCH: 28.8 pg (ref 25.1–34.0)
MCHC: 33.3 g/dL (ref 31.5–36.0)
RBC: 4.12 10*6/uL (ref 3.70–5.45)
RDW: 13.8 % (ref 11.2–14.5)
lymph#: 1.8 10*3/uL (ref 0.9–3.3)

## 2012-09-07 LAB — CANCER ANTIGEN 27.29: CA 27.29: 21 U/mL (ref 0–39)

## 2012-09-09 ENCOUNTER — Telehealth: Payer: Self-pay | Admitting: Oncology

## 2012-09-09 NOTE — Telephone Encounter (Signed)
S/w the pt and she is aware of her r/s sept appt to dec due to a change in the md's schedule

## 2012-09-13 ENCOUNTER — Ambulatory Visit: Payer: 59 | Admitting: Oncology

## 2012-09-28 ENCOUNTER — Other Ambulatory Visit: Payer: Self-pay | Admitting: Gynecology

## 2012-09-28 DIAGNOSIS — N823 Fistula of vagina to large intestine: Secondary | ICD-10-CM

## 2012-10-04 ENCOUNTER — Ambulatory Visit
Admission: RE | Admit: 2012-10-04 | Discharge: 2012-10-04 | Disposition: A | Discharge status: Home / Self Care | Payer: BC Managed Care – PPO | Source: Ambulatory Visit | Attending: Gynecology | Admitting: Gynecology

## 2012-10-04 ENCOUNTER — Other Ambulatory Visit: Payer: Self-pay | Admitting: Gynecology

## 2012-10-04 DIAGNOSIS — N823 Fistula of vagina to large intestine: Secondary | ICD-10-CM

## 2012-10-04 MED ORDER — GADOBENATE DIMEGLUMINE 529 MG/ML IV SOLN
17.0000 mL | Freq: Once | INTRAVENOUS | Status: AC | PRN
  Administered 2012-10-04: 17 mL via INTRAVENOUS

## 2012-11-15 ENCOUNTER — Other Ambulatory Visit: Payer: Self-pay | Admitting: Oncology

## 2012-11-15 ENCOUNTER — Telehealth: Payer: Self-pay | Admitting: *Deleted

## 2012-11-15 ENCOUNTER — Ambulatory Visit (HOSPITAL_BASED_OUTPATIENT_CLINIC_OR_DEPARTMENT_OTHER): Payer: BC Managed Care – PPO | Admitting: Oncology

## 2012-11-15 VITALS — BP 155/85 | HR 111 | Temp 97.6°F | Resp 20 | Ht 59.5 in | Wt 186.3 lb

## 2012-11-15 DIAGNOSIS — C50319 Malignant neoplasm of lower-inner quadrant of unspecified female breast: Secondary | ICD-10-CM

## 2012-11-15 DIAGNOSIS — C50912 Malignant neoplasm of unspecified site of left female breast: Secondary | ICD-10-CM

## 2012-11-15 DIAGNOSIS — R5381 Other malaise: Secondary | ICD-10-CM

## 2012-11-15 DIAGNOSIS — Z170 Estrogen receptor positive status [ER+]: Secondary | ICD-10-CM

## 2012-11-15 MED ORDER — TAMOXIFEN CITRATE 20 MG PO TABS: 20.0000 mg | ORAL_TABLET | Freq: Every day | ORAL | Status: DC

## 2012-11-15 NOTE — Telephone Encounter (Signed)
Gave patient appointment for 2014 

## 2012-11-15 NOTE — Progress Notes (Signed)
ID: Thad Ranger Wolfley   DOB: 11-19-1956  MR#: [id number]  WUJ#:[id number]  PCP: Garth Schlatter, MD GYN: Teodora Medici MD SU: OTHER MDs:  HISTORY OF PRESENT ILLNESS: Mecca Guitron [facility], which showed a possible asymmetry in the left breast.  She was recalled for additional views July 26, 2009 and these were felt to be most likely benign findings, as they appeared essentially unchanged as compared to mammography from 2007.  However, she was set up for six-month followup and on February 11, 2011, with repeat left mammogram and ultrasound, Dr. [name] was able to locate a small spiculated mass in the left lower inner quadrant posteriorly, it was not palpable by physical exam, but ultrasound did show an irregularly marginated solid mass measuring a maximum of 6 mm.  The axilla was negative.  This was felt sufficiently suspicious that the patient was called back for biopsy and this was performed on March 5.  The pathology [id number]) showed an invasive lobular carcinoma grade 2 with no HER2/neu amplification, but strongly estrogen and progesterone receptor positive at 97% and 59% respectively.  The MIB-1 was low at 12%.  With this information the patient was referred for bilateral breast MRIs, and these were performed March 9.  The MRI confirmed a mass measuring 9 mm in the lower inner quadrant of the left breast, irregular and enhancing.  There were no other masses and no suspicious lymph nodes.    The patient underwent left lumpectomy and sentinel lymph node sampling in March 2012 for her invasive lobular breast carcinoma, T1 a N0, grade 1. Tumor was strongly ER and PR positive, HER-2/neu negative, with an MIB-1 of 12%. Margins were ample. Her subsequent history is as detailed below.   INTERVAL HISTORY: Deborah Levy returns today for routine followup of her breast cancer. The interval history is generally stable. She continues to have problems related to  her lupus, and her remote history of rectovaginal fistula.  REVIEW OF SYSTEMS: She feels severely fatigued, and doing something as simple as washing dishes can cause her to sweat. She is very deconditioned, and feels terrible about her weight gain. She is embarrassed to go to the gym. She continues to have a throbbing pains intermittently, particularly involving the knees. She has urinary frequency and dribbling, and in addition occasional gas through the vagina, which she interprets as a recurrence of her fistula, although MRI in September was unable to demonstrate this. Of course she has her lupus rash, and joint pains related to her fibromyalgia. She feels forgetful and depressed, but not suicidal. Hot flashes are moderate, and are better on a half a tablet of megestrol twice daily. A detailed review of systems otherwise was stable.   PAST MEDICAL HISTORY: Past Medical History  Diagnosis Date  . Lupus   . Fibromyalgia   . Hypertension   . Cancer   . GERD (gastroesophageal reflux disease)     PAST SURGICAL HISTORY: Past Surgical History  Procedure Date  . Shoulder surgery   . Knee surgery   . Tubal ligation   . Breast surgery 03/10/2011    Lt br lumpectomy    FAMILY HISTORY Family History  Problem Relation Age of Onset  . Cancer Father     colon  . Hypertension Father   . Heart disease Father   . Cancer Sister     Cervical  . Heart disease Maternal Grandfather   . Hypertension Paternal Grandmother   .  Heart disease Paternal Grandmother     GYNECOLOGIC HISTORY:  She had menarche in her teens.  Last menstrual period about age 24.  She did use hormone replacement, did so for about 15 years and stopped with her breast cancer diagnosis. She was 56 years old when she birthed her first child.  She is Gx, P1.  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  She has been married eight years to [employer], who works at a Dance movement psychotherapist.  The patient's  daughter from a prior marriage is [name] , who lives in [place] and works as a Metallurgist.  The patient has two step children.  They are Ray's children from a prior marriage.  One of them is Research scientist (physical sciences) who works as a Engineer, civil (consulting) with Dr. Felipa Eth here in [place] who lives in [place] and works in Psychologist, educational.  They have no grandchildren.  The patient is not a church attender.   ADVANCED DIRECTIVES:  HEALTH MAINTENANCE: History  Substance Use Topics  . Smoking status: Former Games developer  . Smokeless tobacco: Former Neurosurgeon    Quit date: 02/22/1997  . Alcohol Use: No     Colonoscopy:  PAP:  Bone density:  Lipid panel:  Allergies  Allergen Reactions  . Sulfa Antibiotics Nausea Only    Current Outpatient Prescriptions  Medication Sig Dispense Refill  . aspirin 81 MG tablet Take 81 mg by mouth daily.      . Biotin 5000 MCG CAPS Take 1 capsule by mouth daily.        . Calcium Carbonate-Vitamin D (CALCIUM 600 + D PO) Take 1 tablet by mouth 2 (two) times daily.        . cholecalciferol (VITAMIN D) 400 UNITS TABS Take 2,000 Units by mouth daily.      Marland Kitchen docusate sodium (STOOL SOFTENER) 100 MG capsule Take 100 mg by mouth daily.        Marland Kitchen ezetimibe (ZETIA) 10 MG tablet Take 10 mg by mouth daily.      . fexofenadine (ALLEGRA) 60 MG tablet Take 60 mg by mouth 2 (two) times daily.        . hydrOXYzine (ATARAX) 25 MG tablet Take 25 mg by mouth 2 (two) times daily.        Marland Kitchen lisinopril (PRINIVIL,ZESTRIL) 10 MG tablet Take 10 mg by mouth daily.        . magnesium oxide (MAG-OX) 400 MG tablet Take 400 mg by mouth 2 (two) times daily.        . megestrol (MEGACE) 40 MG tablet 1/2 tab bid  30 tablet  2  . Niacin-Simvastatin [facility]) 1000-40 MG TB24 Take 1 tablet by mouth daily.        Marland Kitchen omeprazole (PRILOSEC) 40 MG capsule       . phentermine 37.5 MG capsule Take 37.5 mg by mouth every morning.      . pregabalin (LYRICA) 100 MG capsule Take 100 mg by mouth.      . simvastatin (ZOCOR) 40 MG  tablet       . tamoxifen (NOLVADEX) 20 MG tablet Take 1 tablet (20 mg total) by mouth daily.  90 tablet  3  . venlafaxine (EFFEXOR-XR) 37.5 MG 24 hr capsule Take 2 capsules (75 mg total) by mouth daily.  30 capsule  5  . zolpidem (AMBIEN) 10 MG tablet Take 10 mg by mouth at bedtime.        . [DISCONTINUED] DULoxetine (CYMBALTA) 60 MG  capsule Take 60 mg by mouth 2 (two) times daily.        . [DISCONTINUED] esomeprazole (NEXIUM) 40 MG capsule Take 40 mg by mouth daily.          OBJECTIVE: Middle-aged white woman in no acute distress Filed Vitals:   11/15/12 1602  BP: 155/85  Pulse: 111  Temp: 97.6 F (36.4 C)  Resp: 20     Body mass index is 37.00 kg/(m^2).    ECOG FS: 1  Sclerae unicteric Oropharynx clear No peripheral adenopathy Lungs no rales or rhonchi, good excursion bilaterally Heart regular rate and rhythm, no murmur appreciated Abd obese, soft, nontender,positive bowel sounds Skin shows a nonpalpable nonconfluent erythematous rash involving particularly the left lower arm consistent with her diagnosis of lupus MSK no focal spinal tenderness, no peripheral edema Neuro: nonfocal Breasts: Right breast is unremarkable. Left breast is status post lumpectomy; no evidence of local recurrence. The left axilla is benign  LAB RESULTS: Lab Results  Component Value Date   WBC 5.5 09/06/2012   NEUTROABS 3.0 09/06/2012   HGB 11.9 09/06/2012   HCT 35.6 09/06/2012   MCV 86.5 09/06/2012   PLT 279 09/06/2012      Chemistry      Component Value Date/Time   NA 139 09/06/2012 1610   NA 140 03/05/2012 1107   K 3.6 09/06/2012 1610   K 4.0 03/05/2012 1107   CL 107 09/06/2012 1610   CL 104 03/05/2012 1107   CO2 24 09/06/2012 1610   CO2 27 03/05/2012 1107   BUN 12.0 09/06/2012 1610   BUN 15 03/05/2012 1107   CREATININE 0.9 09/06/2012 1610   CREATININE 0.96 03/05/2012 1107      Component Value Date/Time   CALCIUM 8.8 09/06/2012 1610   CALCIUM 9.1 03/05/2012 1107   ALKPHOS 56 09/06/2012 1610    ALKPHOS 67 03/05/2012 1107   AST 19 09/06/2012 1610   AST 19 03/05/2012 1107   ALT 19 09/06/2012 1610   ALT 13 03/05/2012 1107   BILITOT 0.20 09/06/2012 1610   BILITOT 0.3 03/05/2012 1107       Lab Results  Component Value Date   LABCA2 21 09/06/2012     STUDIES: Mammography March 2013 showed no evidence of disease recurrence. MRI of the pelvis September 2013 did not demonstrate a rectovaginal fistula   ASSESSMENT: 56 year old [place] woman  (1)  status post left lumpectomy and sentinel lymph node sampling March 2012 for a T1a N0, stage IA invasive lobular breast cancer, grade 1, strongly estrogen and progesterone receptor positive, with an MIB-1 of 12%, no HER-2 amplification and ample margins,   (2)  status post radiation completed in June 2012,   (3)  on tamoxifen since July 2012 with good tolerance  (4)  comorbidities include fibromyalgia and discoid lupus.  PLAN: From my point of view she is doing quite well, and the plan is to continue tamoxifen for total of 5 years, then reassess. She will see [place] again in may of 2014, and we will start seeing her yearly thereafter. Today I gave her information on the [employer] program to see she can start working on her deconditioning. She knows to call for any problems that may develop before the next visit here.   Analiya Porco C    11/15/2012

## 2012-12-10 ENCOUNTER — Other Ambulatory Visit: Payer: Self-pay | Admitting: *Deleted

## 2012-12-10 DIAGNOSIS — C50319 Malignant neoplasm of lower-inner quadrant of unspecified female breast: Secondary | ICD-10-CM

## 2012-12-10 MED ORDER — MEGESTROL ACETATE 40 MG PO TABS: ORAL_TABLET | ORAL | Status: DC

## 2013-02-23 ENCOUNTER — Other Ambulatory Visit: Payer: Self-pay

## 2013-02-23 ENCOUNTER — Telehealth (INDEPENDENT_AMBULATORY_CARE_PROVIDER_SITE_OTHER): Payer: Self-pay

## 2013-02-23 ENCOUNTER — Other Ambulatory Visit: Payer: Self-pay | Admitting: Oncology

## 2013-02-23 DIAGNOSIS — Z853 Personal history of malignant neoplasm of breast: Secondary | ICD-10-CM

## 2013-02-23 NOTE — Telephone Encounter (Signed)
I called and left the pt a message to call me.  She is scheduled to see Dr Derrell Lolling Monday 3/17 and I do not see where she has had her mgm done this year.  She should have it before she comes.  She may want to reschedule until after she has one.

## 2013-02-23 NOTE — Telephone Encounter (Signed)
The pt called me back and rescheduled her appointment for 4/7.  Her mgm is scheduled for 3/31.

## 2013-02-28 ENCOUNTER — Ambulatory Visit (INDEPENDENT_AMBULATORY_CARE_PROVIDER_SITE_OTHER): Payer: Self-pay | Admitting: General Surgery

## 2013-03-14 ENCOUNTER — Ambulatory Visit
Admission: RE | Admit: 2013-03-14 | Discharge: 2013-03-14 | Disposition: A | Discharge status: Home / Self Care | Payer: BC Managed Care – PPO | Source: Ambulatory Visit | Attending: Oncology | Admitting: Oncology

## 2013-03-14 DIAGNOSIS — Z853 Personal history of malignant neoplasm of breast: Secondary | ICD-10-CM

## 2013-03-21 ENCOUNTER — Ambulatory Visit (INDEPENDENT_AMBULATORY_CARE_PROVIDER_SITE_OTHER): Payer: BC Managed Care – PPO | Admitting: General Surgery

## 2013-03-21 ENCOUNTER — Encounter (INDEPENDENT_AMBULATORY_CARE_PROVIDER_SITE_OTHER): Payer: Self-pay | Admitting: General Surgery

## 2013-03-21 VITALS — BP 138/82 | HR 102 | Temp 98.4°F | Resp 18 | Ht 60.0 in | Wt 192.0 lb

## 2013-03-21 DIAGNOSIS — C50919 Malignant neoplasm of unspecified site of unspecified female breast: Secondary | ICD-10-CM

## 2013-03-21 DIAGNOSIS — C50912 Malignant neoplasm of unspecified site of left female breast: Secondary | ICD-10-CM

## 2013-03-21 NOTE — Progress Notes (Signed)
Patient ID: Deborah Levy, female   DOB: 1956/07/20, 57 y.o.   MRN: [id number] History: This patient returns for long-term followup regarding her left breast cancer. On 03/10/2011 she underwent left partial mastectomy and sentinel node biopsy. Final pathology showed invasive lobular carcinoma, 5 mm diameter, negative nodes, receptor positive, HER-2-negative, pathologic stage TIa, N0. She had adjuvant radiation therapy. She is on adjuvant tamoxifen. Recent mammograms on 03/14/2013 are category 2, no focal abnormalities, scarring from lumpectomy noted. She sees Dr. Darnelle Catalan regularly. She says she is still considering reduction mammoplasty for symmetry, but has not sought any advice, is concerned about cost.  ROS: 10 system review of systems is negative except as described above  Exam: Patient is pleasant, slightly anxious. No distress. Overweight. 192 pounds. 5 feet 0. Neck: No adenopathy or mass Lungs: Clear to auscultation bilaterally Heart: Regular rate and rhythm. No murmur. No ectopy Breasts moderately large. Left breast is slightly smaller than right. Good contour. No deformity. Nipple projection  good. Well-healed scar left breast lower inner quadrant and left axilla. No palpable mass in either breast. Skin is healthy otherwise. No axillary adenopathy. No signs of recurrent cancer.  Assessment: Invasive lobular carcinoma left breast, lower inner quadrant, pathologic stage TI A., N0, receptor positive, HER-2/neu negative. No evidence of recurrence 2 years following left partial mastectomy, sentinel node biopsy, adjuvant radiation therapy, and ongoing adjuvant tamoxifen. Mild obesity Hypertension GERD hyperlipidemia  Plan: The patient was reassured. Repeat mammograms in 1 year. Continue tamoxifen and followup with Dr. Darnelle Catalan Return to see me in one year. I told her if she changed her mind and wanted referral to a plastic surgeon and I would be happy to help with  that.    Angelia Mould. Derrell Lolling, M.D., [facility] General and Minimally invasive Surgery Breast and Colorectal Surgery Office:   [phone] Pager:   [phone]

## 2013-03-21 NOTE — Patient Instructions (Signed)
Examination of your breasts and lymph node areas today is normal. There is no evidence of cancer.  Your recent mammograms are also normal. Minimal scarring from your left lumpectomy.  Please contact Dr. Derrell Lolling if you change your  mind and become interested in reduction mammoplasty. Dr. Derrell Lolling will refer you to a plastic surgeon that does that type of surgery.  Be sure to get your bilateral mammograms annually in March.  Return to see Dr. Derrell Lolling in one year, next April.

## 2013-03-24 ENCOUNTER — Other Ambulatory Visit: Payer: Self-pay | Admitting: Gynecology

## 2013-04-05 ENCOUNTER — Other Ambulatory Visit: Payer: Self-pay | Admitting: *Deleted

## 2013-04-05 DIAGNOSIS — C50912 Malignant neoplasm of unspecified site of left female breast: Secondary | ICD-10-CM

## 2013-04-05 MED ORDER — TAMOXIFEN CITRATE 20 MG PO TABS: 20.0000 mg | ORAL_TABLET | Freq: Every day | ORAL | Status: DC

## 2013-04-05 MED ORDER — VENLAFAXINE HCL ER 37.5 MG PO CP24: 75.0000 mg | ORAL_CAPSULE | Freq: Every day | ORAL | Status: DC

## 2013-04-13 ENCOUNTER — Other Ambulatory Visit: Payer: Self-pay | Admitting: Emergency Medicine

## 2013-04-13 DIAGNOSIS — C50912 Malignant neoplasm of unspecified site of left female breast: Secondary | ICD-10-CM

## 2013-04-14 ENCOUNTER — Other Ambulatory Visit (HOSPITAL_BASED_OUTPATIENT_CLINIC_OR_DEPARTMENT_OTHER): Payer: BC Managed Care – PPO | Admitting: Lab

## 2013-04-14 DIAGNOSIS — C50919 Malignant neoplasm of unspecified site of unspecified female breast: Secondary | ICD-10-CM

## 2013-04-14 DIAGNOSIS — C50912 Malignant neoplasm of unspecified site of left female breast: Secondary | ICD-10-CM

## 2013-04-14 LAB — CBC WITH DIFFERENTIAL/PLATELET
BASO%: 0.5 % (ref 0.0–2.0)
LYMPH%: 43 % (ref 14.0–49.7)
MCH: 29.1 pg (ref 25.1–34.0)
MCHC: 33.8 g/dL (ref 31.5–36.0)
MCV: 86.1 fL (ref 79.5–101.0)
MONO%: 13.8 % (ref 0.0–14.0)
Platelets: 317 10*3/uL (ref 145–400)
RBC: 4.22 10*6/uL (ref 3.70–5.45)

## 2013-04-14 LAB — COMPREHENSIVE METABOLIC PANEL (CC13)
ALT: 32 U/L (ref 0–55)
Alkaline Phosphatase: 70 U/L (ref 40–150)
Glucose: 100 mg/dl — ABNORMAL HIGH (ref 70–99)
Sodium: 141 mEq/L (ref 136–145)
Total Bilirubin: 0.25 mg/dL (ref 0.20–1.20)
Total Protein: 6.9 g/dL (ref 6.4–8.3)

## 2013-04-21 ENCOUNTER — Encounter: Payer: Self-pay | Admitting: Physician Assistant

## 2013-04-21 ENCOUNTER — Ambulatory Visit (HOSPITAL_BASED_OUTPATIENT_CLINIC_OR_DEPARTMENT_OTHER): Payer: BC Managed Care – PPO | Admitting: Physician Assistant

## 2013-04-21 ENCOUNTER — Telehealth: Payer: Self-pay | Admitting: Oncology

## 2013-04-21 VITALS — BP 148/82 | HR 103 | Temp 99.0°F | Resp 20 | Ht 60.0 in | Wt 194.9 lb

## 2013-04-21 DIAGNOSIS — E876 Hypokalemia: Secondary | ICD-10-CM | POA: Insufficient documentation

## 2013-04-21 DIAGNOSIS — Z170 Estrogen receptor positive status [ER+]: Secondary | ICD-10-CM

## 2013-04-21 DIAGNOSIS — C50912 Malignant neoplasm of unspecified site of left female breast: Secondary | ICD-10-CM

## 2013-04-21 DIAGNOSIS — L930 Discoid lupus erythematosus: Secondary | ICD-10-CM

## 2013-04-21 DIAGNOSIS — IMO0001 Reserved for inherently not codable concepts without codable children: Secondary | ICD-10-CM

## 2013-04-21 DIAGNOSIS — Z780 Asymptomatic menopausal state: Secondary | ICD-10-CM

## 2013-04-21 DIAGNOSIS — C50319 Malignant neoplasm of lower-inner quadrant of unspecified female breast: Secondary | ICD-10-CM

## 2013-04-21 MED ORDER — ANASTROZOLE 1 MG PO TABS: 1.0000 mg | ORAL_TABLET | Freq: Every day | ORAL | Status: DC

## 2013-04-21 MED ORDER — POTASSIUM CHLORIDE CRYS ER 20 MEQ PO TBCR: 20.0000 meq | EXTENDED_RELEASE_TABLET | Freq: Every day | ORAL | Status: DC

## 2013-04-21 NOTE — Telephone Encounter (Signed)
, °

## 2013-04-21 NOTE — Progress Notes (Signed)
ID: Deborah Levy   DOB: May 14, 1956  MR#: [id number]  WUJ#:[id number]  PCP: Deborah Schlatter, MD GYN: Deborah Medici MD SU: OTHER MDs:  HISTORY OF PRESENT ILLNESS: [facility], which showed a possible asymmetry in the left breast.  She was recalled for additional views July 26, 2009 and these were felt to be most likely benign findings, as they appeared essentially unchanged as compared to mammography from 2007.  However, she was set up for six-month followup and on February 11, 2011, with repeat left mammogram and ultrasound, Dr. Deboraha Levy was able to locate a small spiculated mass in the left lower inner quadrant posteriorly, it was not palpable by physical exam, but ultrasound did show an irregularly marginated solid mass measuring a maximum of 6 mm.  The axilla was negative.  This was felt sufficiently suspicious that the patient was called back for biopsy and this was performed on March 5.  The pathology [id number]) showed an invasive lobular carcinoma grade 2 with no HER2/neu amplification, but strongly estrogen and progesterone receptor positive at 97% and 59% respectively.  The MIB-1 was low at 12%.  With this information the patient was referred for bilateral breast MRIs, and these were performed March 9.  The MRI confirmed a mass measuring 9 mm in the lower inner quadrant of the left breast, irregular and enhancing.  There were no other masses and no suspicious lymph nodes.    The patient underwent left lumpectomy and sentinel lymph node sampling in March 2012 for her invasive lobular breast carcinoma, T1 a N0, grade 1. Tumor was strongly ER and PR positive, HER-2/neu negative, with an MIB-1 of 12%. Margins were ample. Her subsequent history is as detailed below.   INTERVAL HISTORY: Deborah Levy returns today for routine followup of her left breast cancer. She continues on tamoxifen, and has been on the drug for almost 2 years ago.  Approximately one month ago, however, she had an episode of postmenopausal vaginal bleeding, and presented to her GYN, Dr.  Chevis Levy, further evaluation. An endometrial biopsy on 03/24/2013 was benign. Saline ultrasound, however, did confirm polyps, and Dr. Chevis Levy has recommended that she proceed with D&C. Fortunately she's had no additional vaginal bleeding since that time.  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 Her biggest complaints today are pain associated with lupus and rheumatologist. She also has fatigue, and finds it very difficult to exercise due to both of these issues. She also has problems with mild vaginal dryness, and decreased libido which we discussed today.   REVIEW OF SYSTEMS: Deborah Levy has had no recent illnesses and denies any fevers, chills, or night sweats. She has occasional hot flashes. She has no problems with nausea. She tends to be slightly constipated. She's had no change in urinary habits. She denies any cough, phlegm production, or pleurisy, but does have shortness of breath with even minor exertion. She's had no chest pain or palpitations. She denies abnormal headaches or dizziness no change in vision. She does have chronic swelling in the feet and ankles, and occasionally in the hands, for which she has been started on furosemide.  A detailed review of systems otherwise was stable.   PAST MEDICAL HISTORY: Past Medical History  Diagnosis Date  . Lupus   . Fibromyalgia   . Hypertension   . Cancer   . GERD (gastroesophageal reflux disease)     PAST SURGICAL HISTORY: Past Surgical  History  Procedure Laterality Date  . Shoulder surgery    . Knee surgery    . Tubal ligation    . Breast surgery  03/10/2011    Lt br lumpectomy    FAMILY HISTORY Family History  Problem Relation Age of Onset  . Cancer Father     colon  . Hypertension Father   . Heart disease Father   . Cancer Sister     Cervical  . Heart disease Maternal Grandfather   .  Hypertension Paternal Grandmother   . Heart disease Paternal Grandmother     GYNECOLOGIC HISTORY:  She had menarche in her teens.  Last menstrual period about age 35.  She did use hormone replacement, did so for about 15 years and stopped with her breast cancer diagnosis. She was 57 years old when she birthed her first child.  She is Gx, P1.  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  She has been married eight years to Deborah Levy, who works at a Dance movement psychotherapist.  The patient's daughter from a prior marriage is Deborah Levy , who lives in [place] and works as a Metallurgist.  The patient has two step children.  They are Ray's children from a prior marriage.  One of them is Research scientist (physical sciences) who works as a Engineer, civil (consulting) with Dr. Felipa Eth here in [place] [facility] who lives in [place] and works in Psychologist, educational.  They have no grandchildren.  The patient is not a church attender.   ADVANCED DIRECTIVES:  HEALTH MAINTENANCE: History  Substance Use Topics  . Smoking status: Former Games developer  . Smokeless tobacco: Former Neurosurgeon    Quit date: 02/22/1997  . Alcohol Use: No     Colonoscopy:  PAP: Dr. Chevis Levy  Bone density:  Lipid panel:  UTD, Dr. Ivory Broad  Allergies  Allergen Reactions  . Sulfa Antibiotics Nausea Only    Current Outpatient Prescriptions  Medication Sig Dispense Refill  . aspirin 81 MG tablet Take 81 mg by mouth daily.      . Biotin 5000 MCG CAPS Take 2 capsules by mouth daily.       . Calcium Carbonate-Vitamin D (CALCIUM 600 + D PO) Take 1 tablet by mouth 2 (two) times daily.        Marland Kitchen docusate sodium (STOOL SOFTENER) 100 MG capsule Take 100 mg by mouth daily.        . furosemide (LASIX) 40 MG tablet Take 40 mg by mouth daily.      Marland Kitchen lisinopril (PRINIVIL,ZESTRIL) 10 MG tablet Take 10 mg by mouth daily.        . magnesium oxide (MAG-OX) 400 MG tablet Take 250 mg by mouth daily.       . megestrol (MEGACE) 40 MG tablet 1/2 tab bid  30 tablet  4  . methocarbamol  (ROBAXIN) 500 MG tablet Take 500 mg by mouth 4 (four) times daily.      . Methotrexate Sodium (METHOTREXATE PO) Take by mouth once a week. Every Friday take 6 tablets      . omeprazole (PRILOSEC) 40 MG capsule       . pregabalin (LYRICA) 100 MG capsule Take 300 mg by mouth daily.       . simvastatin (ZOCOR) 40 MG tablet Take 40 mg by mouth at bedtime.       Marland Kitchen zolpidem (AMBIEN) 10 MG tablet Take 10 mg by mouth at bedtime.        Marland Kitchen anastrozole (ARIMIDEX) 1 MG  tablet Take 1 tablet (1 mg total) by mouth daily.  30 tablet  5  . fexofenadine (ALLEGRA) 60 MG tablet Take 60 mg by mouth 2 (two) times daily.        . potassium chloride SA (K-DUR,KLOR-CON) 20 MEQ tablet Take 1 tablet (20 mEq total) by mouth daily.  30 tablet  5  . [DISCONTINUED] DULoxetine (CYMBALTA) 60 MG capsule Take 60 mg by mouth 2 (two) times daily.        . [DISCONTINUED] esomeprazole (NEXIUM) 40 MG capsule Take 40 mg by mouth daily.         No current facility-administered medications for this visit.    OBJECTIVE: Middle-aged white woman in no acute distress Filed Vitals:   04/21/13 1522  BP: 148/82  Pulse: 103  Temp: 99 F (37.2 C)  Resp: 20     Body mass index is 38.06 kg/(m^2).    ECOG FS: 1 Filed Weights   04/21/13 1522  Weight: 194 lb 14.4 oz (88.406 kg)   Sclerae unicteric Oropharynx clear No cervical or supraclavicular adenopathy Lungs clear to auscultation bilaterally with no rales or rhonchi, or wheezes Heart regular rate and rhythm, no murmur appreciated Abdomen obese, soft, nontender, positive bowel sounds And MSK no focal spinal tenderness, no peripheral edema Neuro: nonfocal, well oriented, positive affect Breasts: Right breast is unremarkable. Left breast is status post lumpectomy; no suspicious nodularity, skin changes, or evidence of local recurrence. Axillae benign bilaterally, no palpable adenopathy.   LAB RESULTS: Lab Results  Component Value Date   WBC 4.4 04/14/2013   NEUTROABS 1.8 04/14/2013    HGB 12.3 04/14/2013   HCT 36.3 04/14/2013   MCV 86.1 04/14/2013   PLT 317 04/14/2013      Chemistry      Component Value Date/Time   NA 141 04/14/2013 1504   NA 140 03/05/2012 1107   K 3.3* 04/14/2013 1504   K 4.0 03/05/2012 1107   CL 103 04/14/2013 1504   CL 104 03/05/2012 1107   CO2 27 04/14/2013 1504   CO2 27 03/05/2012 1107   BUN 14.8 04/14/2013 1504   BUN 15 03/05/2012 1107   CREATININE 1.0 04/14/2013 1504   CREATININE 0.96 03/05/2012 1107      Component Value Date/Time   CALCIUM 8.7 04/14/2013 1504   CALCIUM 9.1 03/05/2012 1107   ALKPHOS 70 04/14/2013 1504   ALKPHOS 67 03/05/2012 1107   AST 33 04/14/2013 1504   AST 19 03/05/2012 1107   ALT 32 04/14/2013 1504   ALT 13 03/05/2012 1107   BILITOT 0.25 04/14/2013 1504   BILITOT 0.3 03/05/2012 1107        STUDIES:  Mammogram 03/14/2013 was unremarkable.   ASSESSMENT: 57 year old [place] woman   (1)  status post left lumpectomy and sentinel lymph node sampling March 2012 for a T1a N0, stage IA invasive lobular breast cancer, grade 1, strongly estrogen and progesterone receptor positive, with an MIB-1 of 12%, no HER-2 amplification and ample margins,   (2)  status post radiation completed in June 2012,   (3)  on tamoxifen since July 2012 with good tolerance.    (4)  comorbidities include fibromyalgia and discoid lupus.  PLAN:  This case was reviewed with Dr. Darnelle Catalan. In light of the recent episode of postmenopausal bleeding, and the fact that the patient has been diagnosed with polyps, we feel it would be most prudent to discontinue the tamoxifen, and begin an aromatase inhibitor.  I've explained all of this to [facility]  Ann today, and she was given information in writing regarding anastrozole, its benefits, as well as its possible side effects. She is very comfortable with this plan, and in fact feels better herself about stopping the tamoxifen.  We'll obtain a baseline bone density in the next couple of months, and she'll return for followup with  either myself or Dr. Darnelle Catalan in August to assess tolerance of the anastrozole. However, if she is having problematic side effects, she will contact us sooner.  We also discussed the importance of exercise today, with regards to weight management, energy level, and bone density. We also discussed the use of cannot oil vaginal dryness.  Axten Pascucci    04/21/2013

## 2013-04-25 ENCOUNTER — Other Ambulatory Visit: Payer: Self-pay | Admitting: Physician Assistant

## 2013-04-25 DIAGNOSIS — E876 Hypokalemia: Secondary | ICD-10-CM

## 2013-04-25 DIAGNOSIS — C50912 Malignant neoplasm of unspecified site of left female breast: Secondary | ICD-10-CM

## 2013-04-29 ENCOUNTER — Other Ambulatory Visit: Payer: Self-pay | Admitting: Medical Oncology

## 2013-04-29 MED ORDER — MEGESTROL ACETATE 40 MG PO TABS: ORAL_TABLET | ORAL | Status: DC

## 2013-05-21 DIAGNOSIS — M329 Systemic lupus erythematosus, unspecified: Secondary | ICD-10-CM | POA: Insufficient documentation

## 2013-06-06 ENCOUNTER — Other Ambulatory Visit: Payer: Self-pay | Admitting: *Deleted

## 2013-06-06 MED ORDER — VENLAFAXINE HCL 37.5 MG PO TABS: 37.5000 mg | ORAL_TABLET | Freq: Two times a day (BID) | ORAL | Status: DC

## 2013-06-07 ENCOUNTER — Other Ambulatory Visit: Payer: Self-pay | Admitting: *Deleted

## 2013-06-07 MED ORDER — VENLAFAXINE HCL ER 37.5 MG PO CP24: 37.5000 mg | ORAL_CAPSULE | Freq: Every day | ORAL | Status: DC

## 2013-07-06 ENCOUNTER — Other Ambulatory Visit: Payer: BC Managed Care – PPO

## 2013-07-19 ENCOUNTER — Other Ambulatory Visit: Payer: BC Managed Care – PPO

## 2013-07-22 ENCOUNTER — Ambulatory Visit (HOSPITAL_BASED_OUTPATIENT_CLINIC_OR_DEPARTMENT_OTHER): Payer: BC Managed Care – PPO | Admitting: Physician Assistant

## 2013-07-22 ENCOUNTER — Encounter: Payer: Self-pay | Admitting: Physician Assistant

## 2013-07-22 ENCOUNTER — Other Ambulatory Visit (HOSPITAL_BASED_OUTPATIENT_CLINIC_OR_DEPARTMENT_OTHER): Payer: BC Managed Care – PPO | Admitting: Lab

## 2013-07-22 ENCOUNTER — Telehealth: Payer: Self-pay | Admitting: Oncology

## 2013-07-22 VITALS — BP 106/74 | HR 86 | Temp 98.7°F | Resp 20 | Ht 60.0 in | Wt 188.9 lb

## 2013-07-22 DIAGNOSIS — E876 Hypokalemia: Secondary | ICD-10-CM

## 2013-07-22 DIAGNOSIS — Z5189 Encounter for other specified aftercare: Secondary | ICD-10-CM

## 2013-07-22 DIAGNOSIS — C50919 Malignant neoplasm of unspecified site of unspecified female breast: Secondary | ICD-10-CM

## 2013-07-22 DIAGNOSIS — C50912 Malignant neoplasm of unspecified site of left female breast: Secondary | ICD-10-CM

## 2013-07-22 LAB — COMPREHENSIVE METABOLIC PANEL (CC13)
Alkaline Phosphatase: 73 U/L (ref 40–150)
BUN: 16.1 mg/dL (ref 7.0–26.0)
Creatinine: 1.1 mg/dL (ref 0.6–1.1)
Glucose: 124 mg/dl (ref 70–140)
Sodium: 141 mEq/L (ref 136–145)
Total Bilirubin: 0.26 mg/dL (ref 0.20–1.20)

## 2013-07-22 LAB — CBC WITH DIFFERENTIAL/PLATELET
Eosinophils Absolute: 0.1 10*3/uL (ref 0.0–0.5)
LYMPH%: 32.9 % (ref 14.0–49.7)
MCV: 88.4 fL (ref 79.5–101.0)
MONO%: 10.6 % (ref 0.0–14.0)
NEUT#: 4.3 10*3/uL (ref 1.5–6.5)
NEUT%: 54.3 % (ref 38.4–76.8)
Platelets: 315 10*3/uL (ref 145–400)
RBC: 4.07 10*6/uL (ref 3.70–5.45)

## 2013-07-22 MED ORDER — POTASSIUM CHLORIDE CRYS ER 20 MEQ PO TBCR: 20.0000 meq | EXTENDED_RELEASE_TABLET | Freq: Every day | ORAL | Status: DC

## 2013-07-22 MED ORDER — VENLAFAXINE HCL ER 75 MG PO CP24: 75.0000 mg | ORAL_CAPSULE | Freq: Every day | ORAL | Status: DC

## 2013-07-22 NOTE — Progress Notes (Signed)
ID: Deborah Levy Nagorski   DOB: 1956/11/23  MR#: [id number]  WUJ#:[id number]  PCP: Garth Schlatter, MD GYN: Teodora Medici MD SU: OTHER MDs:  HISTORY OF PRESENT ILLNESS: Deborah [facility], which showed a possible asymmetry in the left breast.  She was recalled for additional views July 26, 2009 and these were felt to be most likely benign findings, as they appeared essentially unchanged as compared to mammography from 2007.  However, she was set up for six-month followup and on February 11, 2011, with repeat left mammogram and ultrasound, Dr. [name] was able to locate a small spiculated mass in the left lower inner quadrant posteriorly, it was not palpable by physical exam, but ultrasound did show an irregularly marginated solid mass measuring a maximum of 6 mm.  The axilla was negative.  This was felt sufficiently suspicious that the patient was called back for biopsy and this was performed on March 5.  The pathology [id number]) showed an invasive lobular carcinoma grade 2 with no HER2/neu amplification, but strongly estrogen and progesterone receptor positive at 97% and 59% respectively.  The MIB-1 was low at 12%.  With this information the patient was referred for bilateral breast MRIs, and these were performed March 9.  The MRI confirmed a mass measuring 9 mm in the lower inner quadrant of the left breast, irregular and enhancing.  There were no other masses and no suspicious lymph nodes.    The patient underwent left lumpectomy and sentinel lymph node sampling in March 2012 for her invasive lobular breast carcinoma, T1 a N0, grade 1. Tumor was strongly ER and PR positive, HER-2/neu negative, with an MIB-1 of 12%. Margins were ample. Her subsequent history is as detailed below.   INTERVAL HISTORY: Deborah Levy returns today for routine followup of her left breast cancer. Interval history is remarkable for Deborah Levy having discontinued her tamoxifen and  started on anastrozole following her last appointment here in May 2014. She is tolerating the medication well. Her biggest complaint is continued hot flashes, despite the fact that she is on Megace, Lyrica, and 37.5 mg a venlafaxine daily. These have worsened somewhat since initiating the anastrozole.  She continues to have diffuse joint pain, of course also has fibromyalgia. She thinks her knee pain may have worsened slightly. It has not been a significant change, however, since starting the anastrozole. She is walking a little more. They just got 2 new Sheltie puppies, a boy [name]) and a girl [name]).  REVIEW OF SYSTEMS: Deborah Levy has had no recent illnesses and denies any fevers, chills, or night sweats.  She has no problems with nausea and denies any changes in bowel or bladder habits. She denies any cough, phlegm production, or pleurisy, but does have shortness of breath with even minor exertion. She's had no chest pain or palpitations. She denies abnormal headaches, dizziness, or change in vision. She tends to have mild swelling in the feet and ankles, and this is stable.  A detailed review of systems otherwise was stable.   PAST MEDICAL HISTORY: Past Medical History  Diagnosis Date  . Lupus   . Fibromyalgia   . Hypertension   . Cancer   . GERD (gastroesophageal reflux disease)     PAST SURGICAL HISTORY: Past Surgical History  Procedure Laterality Date  . Shoulder surgery    . Knee surgery    . Tubal ligation    . Breast surgery  03/10/2011  Lt br lumpectomy    FAMILY HISTORY Family History  Problem Relation Age of Onset  . Cancer Father     colon  . Hypertension Father   . Heart disease Father   . Cancer Sister     Cervical  . Heart disease Maternal Grandfather   . Hypertension Paternal Grandmother   . Heart disease Paternal Grandmother     GYNECOLOGIC HISTORY:  She had menarche in her teens.  Last menstrual period about age 39.  She did use hormone replacement, did  so for about 15 years and stopped with her breast cancer diagnosis. She was 57 years old when she birthed her first child.  She is Gx, P1.  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  She has been married eight years to [employer], who works at a Dance movement psychotherapist.  The patient's daughter from a prior marriage is [name] , who lives in [place] and works as a Metallurgist.  The patient has two step children.  They are Ray's children from a prior marriage.  One of them is Research scientist (physical sciences) who works as a Engineer, civil (consulting) with Dr. Felipa Eth here in [place] and [place] who lives in [place] and works in Psychologist, educational.  They have no grandchildren.  The patient is not a church attender.   ADVANCED DIRECTIVES:  HEALTH MAINTENANCE: History  Substance Use Topics  . Smoking status: Former Games developer  . Smokeless tobacco: Former Neurosurgeon    Quit date: 02/22/1997  . Alcohol Use: No     Colonoscopy:  PAP: Dr. Chevis Pretty  Bone density:  Scheduled for 07/26/2013  Lipid panel:  UTD, Dr. Ivory Broad  Allergies  Allergen Reactions  . Sulfa Antibiotics Nausea Only    Current Outpatient Prescriptions  Medication Sig Dispense Refill  . anastrozole (ARIMIDEX) 1 MG tablet Take 1 tablet (1 mg total) by mouth daily.  30 tablet  5  . aspirin 81 MG tablet Take 81 mg by mouth daily.      . Biotin 5000 MCG CAPS Take 2 capsules by mouth daily.       . Calcium Carbonate-Vitamin D (CALCIUM 600 + D PO) Take 1 tablet by mouth 2 (two) times daily.        Marland Kitchen docusate sodium (STOOL SOFTENER) 100 MG capsule Take 100 mg by mouth daily.        . fexofenadine (ALLEGRA) 60 MG tablet Take 60 mg by mouth 2 (two) times daily.        . furosemide (LASIX) 40 MG tablet Take 40 mg by mouth daily.      Marland Kitchen lisinopril (PRINIVIL,ZESTRIL) 10 MG tablet Take 10 mg by mouth daily.        . megestrol (MEGACE) 40 MG tablet 1/2 tab bid  30 tablet  3  . methocarbamol (ROBAXIN) 500 MG tablet Take 500 mg by mouth 4 (four) times daily.      .  Methotrexate Sodium (METHOTREXATE PO) Take by mouth once a week. Every Friday take 6 tablets      . omeprazole (PRILOSEC) 40 MG capsule       . potassium chloride SA (K-DUR,KLOR-CON) 20 MEQ tablet Take 1 tablet (20 mEq total) by mouth daily.  30 tablet  5  . pregabalin (LYRICA) 100 MG capsule Take 300 mg by mouth daily.       . simvastatin (ZOCOR) 40 MG tablet Take 40 mg by mouth at bedtime.       Marland Kitchen venlafaxine XR (EFFEXOR-XR) 75 MG  24 hr capsule Take 1 capsule (75 mg total) by mouth daily.  30 capsule  5  . zolpidem (AMBIEN) 10 MG tablet Take 10 mg by mouth at bedtime.        . magnesium oxide (MAG-OX) 400 MG tablet Take 250 mg by mouth daily.       . [DISCONTINUED] DULoxetine (CYMBALTA) 60 MG capsule Take 60 mg by mouth 2 (two) times daily.        . [DISCONTINUED] esomeprazole (NEXIUM) 40 MG capsule Take 40 mg by mouth daily.         No current facility-administered medications for this visit.    OBJECTIVE: Middle-aged white woman in no acute distress Filed Vitals:   07/22/13 1506  BP: 106/74  Pulse: 86  Temp: 98.7 F (37.1 C)  Resp: 20     Body mass index is 36.89 kg/(m^2).    ECOG FS: 1 Filed Weights   07/22/13 1506  Weight: 188 lb 14.4 oz (85.684 kg)   Sclerae unicteric Oropharynx clear No cervical or supraclavicular adenopathy Lungs clear to auscultation bilaterally with no rales or rhonchi, or wheezes Heart regular rate and rhythm, no murmur appreciated Abdomen obese, soft, nontender, positive bowel sounds MSK no focal spinal tenderness to palpation, Nonpitting pedal edema, equal bilaterally. Neuro: nonfocal, well oriented, positive affect Breasts: Right breast is unremarkable. On close examination there is a small healing lesion just lateral to the areolar complex, only slightly erythematous, and consistent with her recent spider bite. Left breast is status post lumpectomy; no suspicious nodularity, skin changes, or evidence of local recurrence. Axillae benign bilaterally,  no palpable adenopathy.   LAB RESULTS: Lab Results  Component Value Date   WBC 8.0 07/22/2013   NEUTROABS 4.3 07/22/2013   HGB 12.2 07/22/2013   HCT 36.0 07/22/2013   MCV 88.4 07/22/2013   PLT 315 07/22/2013      Chemistry      Component Value Date/Time   NA 141 07/22/2013 1452   NA 140 03/05/2012 1107   K 3.3* 07/22/2013 1452   K 4.0 03/05/2012 1107   CL 103 04/14/2013 1504   CL 104 03/05/2012 1107   CO2 24 07/22/2013 1452   CO2 27 03/05/2012 1107   BUN 16.1 07/22/2013 1452   BUN 15 03/05/2012 1107   CREATININE 1.1 07/22/2013 1452   CREATININE 0.96 03/05/2012 1107      Component Value Date/Time   CALCIUM 8.5 07/22/2013 1452   CALCIUM 9.1 03/05/2012 1107   ALKPHOS 73 07/22/2013 1452   ALKPHOS 67 03/05/2012 1107   AST 24 07/22/2013 1452   AST 19 03/05/2012 1107   ALT 29 07/22/2013 1452   ALT 13 03/05/2012 1107   BILITOT 0.26 07/22/2013 1452   BILITOT 0.3 03/05/2012 1107        STUDIES:  Mammogram 03/14/2013 was unremarkable.  Baseline bone density as scheduled for 07/26/2013.   ASSESSMENT: 57 year old [place] woman   (1)  status post left lumpectomy and sentinel lymph node sampling March 2012 for a T1a N0, stage IA invasive lobular breast cancer, grade 1, strongly estrogen and progesterone receptor positive, with an MIB-1 of 12%, no HER-2 amplification and ample margins,   (2)  status post radiation completed in June 2012,   (3)  on tamoxifen since July 2012 with good tolerance, but with the development of postmenopausal vaginal bleeding and polyps.   (4)  discontinued tamoxifen and started on anastrozole in may 2014  (5)  comorbidities include fibromyalgia and discoid lupus.  PLAN:  Violett Hobbs appears be doing well with regards to her breast cancer, with no clinical evidence of disease recurrence. She'll continue on the anastrozole which is tolerating well. We discussed increasing her venlafaxine to 75 mg daily to see if this helps with her hot flashes, and she was given a prescription  today. I've also refilled her potassium for mild hypokalemia, and she'll continue at 20 mEq daily.  Otherwise, Emaly Boschert is scheduled next week for her bone density. She'll return to see [place] in approximately 6 months for labs and physical exam. Her next mammogram is due in March of 2015. All this was reviewed in detail, and she voices understanding and agreement with this plan. She will call any changes or problems prior to her next appointment.  We again discussed the importance of exercise today, with regards to weight management, energy level, and bone density. I encouraged her to walk at least 5 days per week.   Caylen Kuwahara    07/22/2013

## 2013-07-26 ENCOUNTER — Ambulatory Visit
Admission: RE | Admit: 2013-07-26 | Discharge: 2013-07-26 | Disposition: A | Discharge status: Home / Self Care | Payer: BC Managed Care – PPO | Source: Ambulatory Visit | Attending: Physician Assistant | Admitting: Physician Assistant

## 2013-07-26 DIAGNOSIS — Z780 Asymptomatic menopausal state: Secondary | ICD-10-CM

## 2013-07-26 DIAGNOSIS — C50912 Malignant neoplasm of unspecified site of left female breast: Secondary | ICD-10-CM

## 2013-09-05 ENCOUNTER — Other Ambulatory Visit: Payer: Self-pay | Admitting: *Deleted

## 2013-09-05 DIAGNOSIS — C50919 Malignant neoplasm of unspecified site of unspecified female breast: Secondary | ICD-10-CM

## 2013-09-05 MED ORDER — MEGESTROL ACETATE 40 MG PO TABS: ORAL_TABLET | ORAL | Status: DC

## 2013-09-07 DIAGNOSIS — I10 Essential (primary) hypertension: Secondary | ICD-10-CM | POA: Insufficient documentation

## 2013-09-07 DIAGNOSIS — E785 Hyperlipidemia, unspecified: Secondary | ICD-10-CM | POA: Insufficient documentation

## 2013-09-07 DIAGNOSIS — E669 Obesity, unspecified: Secondary | ICD-10-CM | POA: Insufficient documentation

## 2013-11-11 ENCOUNTER — Other Ambulatory Visit: Payer: Self-pay | Admitting: *Deleted

## 2013-11-11 DIAGNOSIS — C50912 Malignant neoplasm of unspecified site of left female breast: Secondary | ICD-10-CM

## 2013-11-11 MED ORDER — ANASTROZOLE 1 MG PO TABS: 1.0000 mg | ORAL_TABLET | Freq: Every day | ORAL | Status: DC

## 2013-12-27 ENCOUNTER — Other Ambulatory Visit (HOSPITAL_BASED_OUTPATIENT_CLINIC_OR_DEPARTMENT_OTHER): Payer: BC Managed Care – PPO

## 2013-12-27 DIAGNOSIS — C50919 Malignant neoplasm of unspecified site of unspecified female breast: Secondary | ICD-10-CM

## 2013-12-27 DIAGNOSIS — C50912 Malignant neoplasm of unspecified site of left female breast: Secondary | ICD-10-CM

## 2013-12-27 LAB — CBC WITH DIFFERENTIAL/PLATELET
BASO%: 0.7 % (ref 0.0–2.0)
Basophils Absolute: 0 10*3/uL (ref 0.0–0.1)
EOS%: 1.8 % (ref 0.0–7.0)
Eosinophils Absolute: 0.1 10*3/uL (ref 0.0–0.5)
HCT: 36.3 % (ref 34.8–46.6)
HGB: 12.4 g/dL (ref 11.6–15.9)
LYMPH#: 1.9 10*3/uL (ref 0.9–3.3)
LYMPH%: 33.6 % (ref 14.0–49.7)
MCH: 31.2 pg (ref 25.1–34.0)
MCHC: 34.1 g/dL (ref 31.5–36.0)
MCV: 91.5 fL (ref 79.5–101.0)
MONO#: 0.7 10*3/uL (ref 0.1–0.9)
MONO%: 12.4 % (ref 0.0–14.0)
NEUT#: 2.8 10*3/uL (ref 1.5–6.5)
NEUT%: 51.5 % (ref 38.4–76.8)
Platelets: 334 10*3/uL (ref 145–400)
RBC: 3.97 10*6/uL (ref 3.70–5.45)
RDW: 14.1 % (ref 11.2–14.5)
WBC: 5.5 10*3/uL (ref 3.9–10.3)

## 2013-12-27 LAB — COMPREHENSIVE METABOLIC PANEL (CC13)
ALT: 22 U/L (ref 0–55)
AST: 21 U/L (ref 5–34)
Albumin: 3.9 g/dL (ref 3.5–5.0)
Alkaline Phosphatase: 86 U/L (ref 40–150)
Anion Gap: 12 mEq/L — ABNORMAL HIGH (ref 3–11)
BUN: 12.4 mg/dL (ref 7.0–26.0)
CALCIUM: 9 mg/dL (ref 8.4–10.4)
CHLORIDE: 107 meq/L (ref 98–109)
CO2: 24 mEq/L (ref 22–29)
CREATININE: 1.1 mg/dL (ref 0.6–1.1)
Glucose: 176 mg/dl — ABNORMAL HIGH (ref 70–140)
POTASSIUM: 3.4 meq/L — AB (ref 3.5–5.1)
SODIUM: 142 meq/L (ref 136–145)
TOTAL PROTEIN: 7.1 g/dL (ref 6.4–8.3)
Total Bilirubin: 0.27 mg/dL (ref 0.20–1.20)

## 2014-01-03 ENCOUNTER — Telehealth: Payer: Self-pay | Admitting: Oncology

## 2014-01-03 ENCOUNTER — Ambulatory Visit (HOSPITAL_BASED_OUTPATIENT_CLINIC_OR_DEPARTMENT_OTHER): Payer: BC Managed Care – PPO | Admitting: Oncology

## 2014-01-03 VITALS — BP 154/83 | HR 105 | Temp 98.1°F | Resp 20 | Ht 60.0 in | Wt 190.6 lb

## 2014-01-03 DIAGNOSIS — C50912 Malignant neoplasm of unspecified site of left female breast: Secondary | ICD-10-CM

## 2014-01-03 DIAGNOSIS — IMO0001 Reserved for inherently not codable concepts without codable children: Secondary | ICD-10-CM

## 2014-01-03 DIAGNOSIS — C50319 Malignant neoplasm of lower-inner quadrant of unspecified female breast: Secondary | ICD-10-CM

## 2014-01-03 DIAGNOSIS — L930 Discoid lupus erythematosus: Secondary | ICD-10-CM

## 2014-01-03 NOTE — Progress Notes (Signed)
ID: Deborah Levy   DOB: 1956-09-18  MR#: [id number]  BPZ#:[id number]  PCP: Laurel Dimmer, MD GYN: Delila Pereyra MD SU: OTHER MDs:  HISTORY OF PRESENT ILLNESS: Deborah Levy had a screening mammogram at the [facility] in August of 2010, which showed a possible asymmetry in the left breast.  She was recalled for additional views July 26, 2009 and these were felt to be most likely benign findings, as they appeared essentially unchanged as compared to mammography from 2007.  However, she was set up for six-month followup and on February 11, 2011, with repeat left mammogram and ultrasound, Dr. Sadie Haber was able to locate a small spiculated mass in the left lower inner quadrant posteriorly, it was not palpable by physical exam, but ultrasound did show an irregularly marginated solid mass measuring a maximum of 6 mm.  The axilla was negative.  This was felt sufficiently suspicious that the patient was called back for biopsy and this was performed on March 5.  The pathology [id number]) showed an invasive lobular carcinoma grade 2 with no HER2/neu amplification, but strongly estrogen and progesterone receptor positive at 97% and 59% respectively.  The MIB-1 was low at 12%.  With this information the patient was referred for bilateral breast MRIs, and these were performed March 9.  The MRI confirmed a mass measuring 9 mm in the lower inner quadrant of the left breast, irregular and enhancing.  There were no other masses and no suspicious lymph nodes.    The patient underwent left lumpectomy and sentinel lymph node sampling in March 2012 for her invasive lobular breast carcinoma, T1 a N0, grade 1. Tumor was strongly ER and PR positive, HER-2/neu negative, with an MIB-1 of 12%. Margins were ample. Her subsequent history is as detailed below.   INTERVAL HISTORY: Deborah Levy returns today for routine followup of her left breast cancer. Interval history is generally unremarkable. She continues to work at [employer], which means she sits in a computer all day sometimes 10-12 hours a day. She doesn't have time to exercise.  REVIEW OF SYSTEMS: She gets short of breath just walking in the here from the parking lot. She belongs to the gym but it doesn't have a swimming pool and she thinks that she needs to do is start with pool exercises. She is tolerating the anastrozole with moderate hot flashes as her only side effect. She has pain in her legs all the time, feels tired all the time, and occasionally is constipated and has a little bit of bright red blood on the tissue. Otherwise a detailed review of systems today was entirely stable  PAST MEDICAL HISTORY: Past Medical History  Diagnosis Date  . Lupus   . Fibromyalgia   . Hypertension   . Cancer   . GERD (gastroesophageal reflux disease)     PAST SURGICAL HISTORY: Past Surgical History  Procedure Laterality Date  . Shoulder surgery    . Knee surgery    . Tubal ligation    . Breast surgery  03/10/2011    Lt br lumpectomy    FAMILY HISTORY Family History  Problem Relation Age of Onset  . Cancer Father     colon  . Hypertension Father   . Heart disease Father   . Cancer Sister     Cervical  . Heart disease Maternal Grandfather   . Hypertension Paternal Grandmother   . Heart disease Paternal Grandmother     GYNECOLOGIC HISTORY:  She had menarche in her  teens.  Last menstrual period about age 58.  She did use hormone replacement, did so for about 15 years and stopped with her breast cancer diagnosis. She was 58 years old when she birthed her first child.  She is Gx, P1.  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  She has been married eight years to [employer], who works at a Chartered certified accountant.  The patient's daughter from a prior marriage is Willette Cluster , who lives in [place] and works as a Air traffic controller.  The patient has two step children.  They are Ray's children from a prior marriage.  One of  them is Water quality scientist who works as a Marine scientist with Dr. Dagmar Hait here in [place] and [place] who lives in [place] and works in Programmer, applications.  They have no grandchildren.  The patient is not a church attender.   ADVANCED DIRECTIVES:  HEALTH MAINTENANCE: History  Substance Use Topics  . Smoking status: Former Research scientist (life sciences)  . Smokeless tobacco: Former Systems developer    Quit date: 02/22/1997  . Alcohol Use: No     Colonoscopy:  PAP: Dr. Carren Rang  Bone density:  Scheduled for 07/26/2013  Lipid panel:  UTD, Dr. Seth Bake  Allergies  Allergen Reactions  . Sulfa Antibiotics Nausea Only    Current Outpatient Prescriptions  Medication Sig Dispense Refill  . anastrozole (ARIMIDEX) 1 MG tablet Take 1 tablet (1 mg total) by mouth daily.  30 tablet  1  . aspirin 81 MG tablet Take 81 mg by mouth daily.      . Biotin 5000 MCG CAPS Take 2 capsules by mouth daily.       . Calcium Carbonate-Vitamin D (CALCIUM 600 + D PO) Take 1 tablet by mouth 2 (two) times daily.        Marland Kitchen docusate sodium (STOOL SOFTENER) 100 MG capsule Take 100 mg by mouth daily.        . fexofenadine (ALLEGRA) 60 MG tablet Take 60 mg by mouth 2 (two) times daily.        . furosemide (LASIX) 40 MG tablet Take 40 mg by mouth daily.      Marland Kitchen lisinopril (PRINIVIL,ZESTRIL) 10 MG tablet Take 10 mg by mouth daily.        . magnesium oxide (MAG-OX) 400 MG tablet Take 250 mg by mouth daily.       . megestrol (MEGACE) 40 MG tablet 1/2 tab bid  30 tablet  4  . methocarbamol (ROBAXIN) 500 MG tablet Take 500 mg by mouth 4 (four) times daily.      . Methotrexate Sodium (METHOTREXATE PO) Take by mouth once a week. Every Friday take 6 tablets      . omeprazole (PRILOSEC) 40 MG capsule       . potassium chloride SA (K-DUR,KLOR-CON) 20 MEQ tablet Take 1 tablet (20 mEq total) by mouth daily.  30 tablet  5  . pregabalin (LYRICA) 100 MG capsule Take 300 mg by mouth daily.       . simvastatin (ZOCOR) 40 MG tablet Take 40 mg by mouth at bedtime.       Marland Kitchen venlafaxine XR (EFFEXOR-XR) 75  MG 24 hr capsule Take 1 capsule (75 mg total) by mouth daily.  30 capsule  5  . zolpidem (AMBIEN) 10 MG tablet Take 10 mg by mouth at bedtime.        . [DISCONTINUED] DULoxetine (CYMBALTA) 60 MG capsule Take 60 mg by mouth 2 (two) times daily.        . [  DISCONTINUED] esomeprazole (NEXIUM) 40 MG capsule Take 40 mg by mouth daily.         No current facility-administered medications for this visit.    OBJECTIVE: Middle-aged white woman who appears stated age 30 Vitals:   01/03/14 1558  BP: 154/83  Pulse: 105  Temp: 98.1 F (36.7 C)  Resp: 20     Body mass index is 37.22 kg/(m^2).    ECOG FS: 1 Filed Weights   01/03/14 1558  Weight: 190 lb 9.6 oz (86.456 kg)   Sclerae unicteric, pupils equal and round Oropharynx no thrush or other lesions No cervical or supraclavicular adenopathy Lungs clear to auscultation bilaterally Heart regular rate and rhythm, no murmur appreciated Abdomen obese, soft, nontender, positive bowel sounds MSK no focal spinal tenderness to palpation, no upper extremity lymphedema Neuro: nonfocal, well oriented, positive affect Breasts: Right breast is unremarkable.Left breast is status post lumpectomy and radiation; there is no evidence of local recurrence. The left axilla is benign   LAB RESULTS: Lab Results  Component Value Date   WBC 5.5 12/27/2013   NEUTROABS 2.8 12/27/2013   HGB 12.4 12/27/2013   HCT 36.3 12/27/2013   MCV 91.5 12/27/2013   PLT 334 12/27/2013      Chemistry      Component Value Date/Time   NA 142 12/27/2013 1615   NA 140 03/05/2012 1107   K 3.4* 12/27/2013 1615   K 4.0 03/05/2012 1107   CL 103 04/14/2013 1504   CL 104 03/05/2012 1107   CO2 24 12/27/2013 1615   CO2 27 03/05/2012 1107   BUN 12.4 12/27/2013 1615   BUN 15 03/05/2012 1107   CREATININE 1.1 12/27/2013 1615   CREATININE 0.96 03/05/2012 1107      Component Value Date/Time   CALCIUM 9.0 12/27/2013 1615   CALCIUM 9.1 03/05/2012 1107   ALKPHOS 86 12/27/2013 1615   ALKPHOS 67  03/05/2012 1107   AST 21 12/27/2013 1615   AST 19 03/05/2012 1107   ALT 22 12/27/2013 1615   ALT 13 03/05/2012 1107   BILITOT 0.27 12/27/2013 1615   BILITOT 0.3 03/05/2012 1107        STUDIES:  Mammogram 03/14/2013 was unremarkable.  Clinical Data: 58 year old postmenopausal female with history of  breast cancer. The patient has a history of lupus and  fibromyalgia. She has taken anastrozole for 3 months, methotrexate  for less than 1 year, omeprazole or Nexium for several years,  effexor for 2 years, and Lyrica for 5 years. She takes calcium and  vitamin D. She stopped taking Fosamax many years ago.  DUAL X-RAY ABSORPTIOMETRY (DXA) FOR BONE MINERAL DENSITY August 2014 LEFT FEMUR (NECK)  Bone Mineral Density (BMD): 0.743 g/cm2  Young Adult T Score: -1.0  Z Score: 0.3  LEFT FOREARM (1/3 RADIUS)  Bone Mineral Density (BMD): 0.653 g/cm2  Young Adult T Score: -0.7  Z Score: 0.5  ASSESSMENT: Patient's diagnostic category is NORMAL by WHO  Criteria.  FRACTURE RISK: NOT INCREASED    ASSESSMENT: 58 y.o. [place] woman   (1)  status post left lumpectomy and sentinel lymph node sampling March 2012 for a T1a N0, stage IA invasive lobular breast cancer, grade 1, strongly estrogen and progesterone receptor positive, with an MIB-1 of 12%, no HER-2 amplification and ample margins,   (2)  status post radiation completed in June 2012,   (3)  on tamoxifen since July 2012 with good tolerance, but with the development of postmenopausal vaginal bleeding and polyps.   (4)  discontinued tamoxifen and started on anastrozole in may 2014; bone density August 2014 was normal  (5)  comorbidities include fibromyalgia and discoid lupus.    PLAN:  Deborah Levy is doing well from a breast cancer point of view. She will see [place] again in July, and then yearly for the next 2 years until she completes a total of 5 years on antiestrogen.  I strongly urged her to start exercising regularly. She does belong to  the gym but does not go there. I offered her and died consult but she would like to "think about it". She is working very hard and finds it very stressful, and really does not have a lot of time for herself. This is unfortunate because it is beginning to cause her problems including knee pain which is only to make it more difficult for her to exercise later.  She knows to call for any problems that may develop before next visit here.  MAGRINAT,GUSTAV C    01/03/2014

## 2014-01-03 NOTE — Telephone Encounter (Signed)
, °

## 2014-01-06 ENCOUNTER — Other Ambulatory Visit: Payer: Self-pay | Admitting: *Deleted

## 2014-01-06 DIAGNOSIS — C50912 Malignant neoplasm of unspecified site of left female breast: Secondary | ICD-10-CM

## 2014-01-06 MED ORDER — ANASTROZOLE 1 MG PO TABS: 1.0000 mg | ORAL_TABLET | Freq: Every day | ORAL | Status: DC

## 2014-02-13 ENCOUNTER — Other Ambulatory Visit: Payer: Self-pay | Admitting: Oncology

## 2014-02-13 ENCOUNTER — Telehealth: Payer: Self-pay | Admitting: Oncology

## 2014-02-13 DIAGNOSIS — Z853 Personal history of malignant neoplasm of breast: Secondary | ICD-10-CM

## 2014-02-13 DIAGNOSIS — G4700 Insomnia, unspecified: Secondary | ICD-10-CM | POA: Insufficient documentation

## 2014-02-13 NOTE — Telephone Encounter (Signed)
FAXED PT [place]

## 2014-03-24 ENCOUNTER — Ambulatory Visit
Admission: RE | Admit: 2014-03-24 | Discharge: 2014-03-24 | Disposition: A | Discharge status: Home / Self Care | Payer: BC Managed Care – PPO | Source: Ambulatory Visit | Attending: Oncology | Admitting: Oncology

## 2014-03-24 ENCOUNTER — Other Ambulatory Visit: Payer: Self-pay | Admitting: Oncology

## 2014-03-24 DIAGNOSIS — N6310 Unspecified lump in the right breast, unspecified quadrant: Secondary | ICD-10-CM

## 2014-03-24 DIAGNOSIS — Z853 Personal history of malignant neoplasm of breast: Secondary | ICD-10-CM

## 2014-04-11 ENCOUNTER — Encounter (INDEPENDENT_AMBULATORY_CARE_PROVIDER_SITE_OTHER): Payer: Self-pay | Admitting: General Surgery

## 2014-04-11 ENCOUNTER — Ambulatory Visit (INDEPENDENT_AMBULATORY_CARE_PROVIDER_SITE_OTHER): Payer: BC Managed Care – PPO | Admitting: General Surgery

## 2014-04-11 VITALS — BP 150/75 | HR 97 | Temp 97.8°F | Resp 20 | Ht 59.75 in | Wt 192.0 lb

## 2014-04-11 DIAGNOSIS — C50912 Malignant neoplasm of unspecified site of left female breast: Secondary | ICD-10-CM

## 2014-04-11 DIAGNOSIS — C50919 Malignant neoplasm of unspecified site of unspecified female breast: Secondary | ICD-10-CM

## 2014-04-11 NOTE — Progress Notes (Signed)
Patient ID: Deborah Levy, female   DOB: Nov 02, 1956, 58 y.o.   MRN: [id number] Patient ID: Deborah Levy, female   DOB: [birth year], 58 y.o.   MRN: [id number]  History: This patient returns for long-term followup regarding her left breast cancer. On 03/10/2011 she underwent left partial mastectomy and sentinel node biopsy. Final pathology showed invasive lobular carcinoma, 5 mm diameter, negative nodes, receptor positive, HER-2-negative, pathologic stage TIa, N0. She had adjuvant radiation therapy. She is on adjuvant tamoxifen, and is now on anastrozole. Recent mammograms on 03/24/2014 are category 2, no focal abnormalities, scarring from lumpectomy noted. She sees Dr. Jana Hakim regularly. She has no new complaints about her breasts. She has noted health problems.   Past history, family history, social history are all documented on the chart, unchanged, and noncontributory except as described above. ROS: 10 system review of systems is negative except as described above  Exam: Patient is pleasant, . No distress. Overweight. Neck: No adenopathy or mass Lungs: Clear to auscultation bilaterally Heart: Regular rate and rhythm. No murmur. No ectopy Breasts moderately large. Left breast is slightly smaller than right. Good contour. No deformity. Nipple projection  good. Well-healed scar left breast upper outer quadrant and left axilla. No palpable mass in either breast. Skin is healthy otherwise. No axillary adenopathy. No signs of recurrent cancer.  Assessment: Invasive lobular carcinoma left breast, lower inner quadrant, pathologic stage T1a., N0, receptor positive, HER-2/neu negative. No evidence of recurrence 3 years following left partial mastectomy, sentinel node biopsy, adjuvant radiation therapy, and ongoing adjuvant tamoxifen. Mild obesity Hypertension GERD hyperlipidemia  Plan: The patient was reassured. Repeat mammograms in 1 year. Continue anastrozole and followup with Dr.  Jana Hakim Return to see me in one year.     Deborah Levy. Deborah Levy, M.D., [facility] General and Minimally invasive Surgery Breast and Colorectal Surgery Office:   [phone] Pager:   [phone]

## 2014-04-11 NOTE — Patient Instructions (Signed)
Examination of your breasts and all of the regional lymph nodes is normal. Your mammograms are also normal. There is no evidence of cancer.  Continue the anastrozole medicine and continue routine followup with Dr. Jana Hakim.   Return to see Dr. Dalbert Batman in one year after you get your annual mammograms.

## 2014-04-18 ENCOUNTER — Other Ambulatory Visit: Payer: Self-pay | Admitting: *Deleted

## 2014-04-18 DIAGNOSIS — C50919 Malignant neoplasm of unspecified site of unspecified female breast: Secondary | ICD-10-CM

## 2014-04-18 MED ORDER — VENLAFAXINE HCL ER 75 MG PO CP24: 75.0000 mg | ORAL_CAPSULE | Freq: Every day | ORAL | Status: DC

## 2014-04-27 ENCOUNTER — Other Ambulatory Visit: Payer: Self-pay | Admitting: Gynecology

## 2014-05-26 ENCOUNTER — Other Ambulatory Visit: Payer: Self-pay | Admitting: *Deleted

## 2014-05-26 DIAGNOSIS — E876 Hypokalemia: Secondary | ICD-10-CM

## 2014-05-26 MED ORDER — POTASSIUM CHLORIDE CRYS ER 20 MEQ PO TBCR: 20.0000 meq | EXTENDED_RELEASE_TABLET | Freq: Every day | ORAL | Status: DC

## 2014-06-22 ENCOUNTER — Telehealth: Payer: Self-pay | Admitting: *Deleted

## 2014-06-22 NOTE — Telephone Encounter (Signed)
Notified pt of appointment and provider change on 07/04/2014

## 2014-07-05 ENCOUNTER — Other Ambulatory Visit (HOSPITAL_BASED_OUTPATIENT_CLINIC_OR_DEPARTMENT_OTHER): Payer: BC Managed Care – PPO

## 2014-07-05 ENCOUNTER — Ambulatory Visit (HOSPITAL_BASED_OUTPATIENT_CLINIC_OR_DEPARTMENT_OTHER): Payer: BC Managed Care – PPO | Admitting: Hematology

## 2014-07-05 VITALS — BP 153/81 | HR 104 | Temp 98.2°F | Resp 18 | Ht 59.75 in | Wt 194.9 lb

## 2014-07-05 DIAGNOSIS — C50319 Malignant neoplasm of lower-inner quadrant of unspecified female breast: Secondary | ICD-10-CM

## 2014-07-05 DIAGNOSIS — Z170 Estrogen receptor positive status [ER+]: Secondary | ICD-10-CM

## 2014-07-05 DIAGNOSIS — L930 Discoid lupus erythematosus: Secondary | ICD-10-CM

## 2014-07-05 DIAGNOSIS — C50919 Malignant neoplasm of unspecified site of unspecified female breast: Secondary | ICD-10-CM

## 2014-07-05 DIAGNOSIS — M79609 Pain in unspecified limb: Secondary | ICD-10-CM

## 2014-07-05 DIAGNOSIS — C50912 Malignant neoplasm of unspecified site of left female breast: Secondary | ICD-10-CM

## 2014-07-05 LAB — CBC WITH DIFFERENTIAL/PLATELET
BASO%: 1 % (ref 0.0–2.0)
Basophils Absolute: 0 10*3/uL (ref 0.0–0.1)
EOS ABS: 0.2 10*3/uL (ref 0.0–0.5)
EOS%: 3.2 % (ref 0.0–7.0)
HCT: 38.6 % (ref 34.8–46.6)
HGB: 12.5 g/dL (ref 11.6–15.9)
LYMPH#: 1.5 10*3/uL (ref 0.9–3.3)
LYMPH%: 32.3 % (ref 14.0–49.7)
MCH: 28.9 pg (ref 25.1–34.0)
MCHC: 32.4 g/dL (ref 31.5–36.0)
MCV: 89.4 fL (ref 79.5–101.0)
MONO#: 0.3 10*3/uL (ref 0.1–0.9)
MONO%: 6.6 % (ref 0.0–14.0)
NEUT%: 56.9 % (ref 38.4–76.8)
NEUTROS ABS: 2.7 10*3/uL (ref 1.5–6.5)
Platelets: 311 10*3/uL (ref 145–400)
RBC: 4.32 10*6/uL (ref 3.70–5.45)
RDW: 14.1 % (ref 11.2–14.5)
WBC: 4.7 10*3/uL (ref 3.9–10.3)

## 2014-07-05 LAB — COMPREHENSIVE METABOLIC PANEL (CC13)
ALBUMIN: 3.8 g/dL (ref 3.5–5.0)
ALT: 39 U/L (ref 0–55)
ANION GAP: 9 meq/L (ref 3–11)
AST: 29 U/L (ref 5–34)
Alkaline Phosphatase: 73 U/L (ref 40–150)
BUN: 18.3 mg/dL (ref 7.0–26.0)
CALCIUM: 9.2 mg/dL (ref 8.4–10.4)
CHLORIDE: 107 meq/L (ref 98–109)
CO2: 25 meq/L (ref 22–29)
Creatinine: 1 mg/dL (ref 0.6–1.1)
Glucose: 183 mg/dl — ABNORMAL HIGH (ref 70–140)
POTASSIUM: 4 meq/L (ref 3.5–5.1)
SODIUM: 140 meq/L (ref 136–145)
Total Bilirubin: 0.33 mg/dL (ref 0.20–1.20)
Total Protein: 6.9 g/dL (ref 6.4–8.3)

## 2014-07-06 ENCOUNTER — Encounter: Payer: Self-pay | Admitting: Hematology

## 2014-07-06 NOTE — Progress Notes (Signed)
ID: Terisa Starr Deterding   DOB: [birth year]  MR#: [id number]  CHE#:[id number]  PCP: Laurel Dimmer, MD GYN: Delila Pereyra MD NT:IRWERXV Ingram OTHER MDs:  REASON FOR OFFICE VISIT: Breast cancer follow up.  PATIENT IDENTIFICATION:  Deborah Levy is a 58 years old female who underwent a screening mammogram at the [facility] in August of 2010, which showed a possible asymmetry in the left breast.  She was recalled for additional views July 26, 2009 and these were felt to be most likely benign findings, as they appeared essentially unchanged as compared to mammography from 2007.  However, she was set up for six-month followup and on February 11, 2011, with repeat left mammogram and ultrasound, Dr. Sadie Haber was able to locate a small spiculated mass in the left lower inner quadrant posteriorly, it was not palpable by physical exam, but ultrasound did show an irregularly marginated solid mass measuring a maximum of 6 mm.  The axilla was negative.  This was felt sufficiently suspicious that the patient was called back for biopsy and this was performed on February 17 2011.  The pathology [id number]) showed an invasive lobular carcinoma grade 2 with no HER2/neu amplification, but strongly estrogen and progesterone receptor positive at 97% and 59% respectively.  The MIB-1 was low at 12%.  With this information the patient was referred for bilateral breast MRIs, and these were performed March 9.  The MRI confirmed a mass measuring 9 mm in the lower inner quadrant of the left breast, irregular and enhancing.  There were no other masses and no suspicious lymph nodes.    The patient underwent left lumpectomy and sentinel lymph node sampling in March 2012 for her invasive lobular breast carcinoma, T1 a N0, grade 1. Tumor was strongly ER and PR positive, HER-2/neu negative, with an MIB-1 of 12%. Margins were ample.   She is status post radiation completed in June 2012, she started tamoxifen since July 2012 with good  tolerance, but with the development of postmenopausal vaginal bleeding and polyps. It was discontinued and she started on anastrozole in may 2014; bone density August 2014 was normal  INTERVAL HISTORY: Deborah Levy returns today for routine followup of her left breast cancer. Interval history is generally unremarkable. She continues to work at [employer], which means she sits in a computer all day sometimes 10-12 hours a day. She doesn't have time to exercise.Patient suffers from fibromyalgia and lupus but no recent flare ups. Her biggest complaint with Tamoxifen was hot flashes and she started using Megace with good results but since she switched to Anastrozole, there are no major hot flashes and she is wondering if we can stop megace. There is also a possibility of megace causing thromboembolic phenomenon so I think it would be reasonable to stop megace x 2 weeks and re-evaluate how she is feeling and doing. She does say her head gets wet occasionally. She is still not going to gym despite having a membership. Mammogram done on 03/24/2014 was BIRADS category 2 benign mammogram. DEXA done July 26, 2013 showed normal results T score was -1.0 in left femur neck, -0.7 in right forearm radius.   REVIEW OF SYSTEMS: She belongs to the gym but it doesn't have a swimming pool and she thinks that she needs to do is start with pool exercises. She is tolerating the anastrozole with moderate hot flashes as her only side effect. She has pain in her legs all the time, feels tired all the time. She feels her head gets wet.  No headache, fever, chills, SOB, CP, palpitations, ankle edema, rashes or new symptoms. Otherwise a detailed review of systems today was entirely stable  PAST MEDICAL HISTORY: Past Medical History  Diagnosis Date  . Lupus   . Fibromyalgia   . Hypertension   . Cancer   . GERD (gastroesophageal reflux disease)     PAST SURGICAL HISTORY: Past Surgical History  Procedure Laterality Date  .  Shoulder surgery    . Knee surgery    . Tubal ligation    . Breast surgery  03/10/2011    Lt br lumpectomy    FAMILY HISTORY Family History  Problem Relation Age of Onset  . Cancer Father     colon  . Hypertension Father   . Heart disease Father   . Cancer Sister     Cervical  . Heart disease Maternal Grandfather   . Hypertension Paternal Grandmother   . Heart disease Paternal Grandmother     GYNECOLOGIC HISTORY:  She had menarche in her teens.  Last menstrual period about age 89.  She did use hormone replacement, did so for about 15 years and stopped with her breast cancer diagnosis. She was 58 years old when she birthed her first child.  She is Gx, P1.  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  She has been married eight years to [employer], who works at a Chartered certified accountant.  The patient's daughter from a prior marriage is Deborah Levy , who lives in [place] and works as a Air traffic controller.  The patient has two step children.  They are Ray's children from a prior marriage.  One of them is Water quality scientist who works as a Marine scientist with Dr. Dagmar Hait here in [place] and [place] who lives in [place] and works in Programmer, applications.  They have no grandchildren.  The patient is not a church attender.   ADVANCED DIRECTIVES:  HEALTH MAINTENANCE: History  Substance Use Topics  . Smoking status: Former Research scientist (life sciences)  . Smokeless tobacco: Former Systems developer    Quit date: 02/22/1997  . Alcohol Use: No     Colonoscopy:  PAP: Dr. Carren Rang  Bone density:  07/26/2013  Lipid panel:  UTD, Dr. Seth Bake  Allergies  Allergen Reactions  . Sulfa Antibiotics Nausea Only    Current Outpatient Prescriptions  Medication Sig Dispense Refill  . anastrozole (ARIMIDEX) 1 MG tablet Take 1 tablet (1 mg total) by mouth daily.  30 tablet  6  . aspirin 81 MG tablet Take 81 mg by mouth daily.      . Biotin 5000 MCG CAPS Take 2 capsules by mouth daily.       . Calcium Carbonate-Vitamin D (CALCIUM 600 + D PO)  Take 1 tablet by mouth 2 (two) times daily.        . furosemide (LASIX) 40 MG tablet Take 40 mg by mouth daily.      Marland Kitchen lisinopril (PRINIVIL,ZESTRIL) 10 MG tablet Take 10 mg by mouth daily.        . magnesium oxide (MAG-OX) 400 MG tablet Take 250 mg by mouth daily.       . megestrol (MEGACE) 40 MG tablet 1/2 tab bid  30 tablet  4  . omeprazole (PRILOSEC) 40 MG capsule       . potassium chloride SA (K-DUR,KLOR-CON) 20 MEQ tablet Take 1 tablet (20 mEq total) by mouth daily.  30 tablet  1  . pregabalin (LYRICA) 100 MG capsule Take 300 mg by mouth daily.       Marland Kitchen  simvastatin (ZOCOR) 40 MG tablet Take 40 mg by mouth at bedtime.       Marland Kitchen venlafaxine XR (EFFEXOR-XR) 75 MG 24 hr capsule Take 1 capsule (75 mg total) by mouth daily.  30 capsule  2  . zolpidem (AMBIEN) 10 MG tablet Take 10 mg by mouth at bedtime.        . [DISCONTINUED] DULoxetine (CYMBALTA) 60 MG capsule Take 60 mg by mouth 2 (two) times daily.        . [DISCONTINUED] esomeprazole (NEXIUM) 40 MG capsule Take 40 mg by mouth daily.         No current facility-administered medications for this visit.    OBJECTIVE: Middle-aged white woman who appears stated age 62 Vitals:   07/05/14 1455  BP: 153/81  Pulse: 104  Temp: 98.2 F (36.8 C)  Resp: 18     Body mass index is 38.37 kg/(m^2).    ECOG FS: 1 Filed Weights   07/05/14 1455  Weight: 194 lb 14.4 oz (88.406 kg)   Sclerae unicteric, pupils equal and round Oropharynx no thrush or other lesions No cervical or supraclavicular adenopathy Lungs clear to auscultation bilaterally Heart regular rate and rhythm, no murmur appreciated Abdomen obese, soft, nontender, positive bowel sounds MSK no focal spinal tenderness to palpation, no upper extremity lymphedema Neuro: nonfocal, well oriented, positive affect Breasts: Right breast is unremarkable.Left breast is status post lumpectomy and radiation; there is no evidence of local recurrence. The left axilla is benign   LAB RESULTS: Lab  Results  Component Value Date   WBC 4.7 07/05/2014   NEUTROABS 2.7 07/05/2014   HGB 12.5 07/05/2014   HCT 38.6 07/05/2014   MCV 89.4 07/05/2014   PLT 311 07/05/2014      Chemistry      Component Value Date/Time   NA 140 07/05/2014 1438   NA 140 03/05/2012 1107   K 4.0 07/05/2014 1438   K 4.0 03/05/2012 1107   CL 103 04/14/2013 1504   CL 104 03/05/2012 1107   CO2 25 07/05/2014 1438   CO2 27 03/05/2012 1107   BUN 18.3 07/05/2014 1438   BUN 15 03/05/2012 1107   CREATININE 1.0 07/05/2014 1438   CREATININE 0.96 03/05/2012 1107      Component Value Date/Time   CALCIUM 9.2 07/05/2014 1438   CALCIUM 9.1 03/05/2012 1107   ALKPHOS 73 07/05/2014 1438   ALKPHOS 67 03/05/2012 1107   AST 29 07/05/2014 1438   AST 19 03/05/2012 1107   ALT 39 07/05/2014 1438   ALT 13 03/05/2012 1107   BILITOT 0.33 07/05/2014 1438   BILITOT 0.3 03/05/2012 1107        STUDIES:  Mammogram 03/24/2014 was unremarkable and BIRADS 2.   DUAL X-RAY ABSORPTIOMETRY (DXA) FOR BONE MINERAL DENSITY August 2014 LEFT FEMUR (NECK)  Bone Mineral Density (BMD): 0.743 g/cm2  Young Adult T Score: -1.0  Z Score: 0.3  LEFT FOREARM (1/3 RADIUS)  Bone Mineral Density (BMD): 0.653 g/cm2  Young Adult T Score: -0.7  Z Score: 0.5  ASSESSMENT: Patient's diagnostic category is NORMAL by WHO  Criteria.  FRACTURE RISK: NOT INCREASED    ASSESSMENT/PLAN:  58 y.o. [place] woman   (1)  status post left lumpectomy and sentinel lymph node sampling March 2012 for a T1a N0, stage IA invasive lobular breast cancer, grade 1, strongly estrogen and progesterone receptor positive, with an MIB-1 of 12%, no HER-2 amplification and ample margins,   (2)  status post radiation completed in June 2012,   (  3)  on tamoxifen since July 2012 with good tolerance, but with the development of postmenopausal vaginal bleeding and polyps.   (4)  discontinued tamoxifen and started on anastrozole in may 2014; bone density August 2014 was normal  (5)  comorbidities  include fibromyalgia and discoid lupus.  (6) Physical exam, breast exam, labs were normal.Lue Lelon Frohlich is doing well from a breast cancer point of view. She will see [place] again in a year.  (7) Stop Megace x 2 weeks and if her hot flashes get worse she will call us and then we can either start Megace or some other therapy like Vitamin E.  (8) Her next mammogram is in April 2016, bone density in August 2016 and she has to complete anastrazole through July 2017 to complete the adjuvant endocrine therapy program.  She knows to call for any problems that may develop before next visit here.  Bernadene Bell, MD Medical Hematologist/Oncologist [place] Pager: [phone] Office No: [phone]

## 2014-07-07 ENCOUNTER — Telehealth: Payer: Self-pay | Admitting: Oncology

## 2014-07-07 NOTE — Telephone Encounter (Signed)
lvm for pt regarding to JUly appt...mailed appt sched avs adn letter to pt.Deborah KitchenMarland Levy

## 2014-07-20 ENCOUNTER — Telehealth: Payer: Self-pay

## 2014-07-20 ENCOUNTER — Other Ambulatory Visit: Payer: Self-pay | Admitting: *Deleted

## 2014-07-20 DIAGNOSIS — C50912 Malignant neoplasm of unspecified site of left female breast: Secondary | ICD-10-CM

## 2014-07-20 MED ORDER — VITAMIN E 180 MG (400 UNIT) PO CAPS
400.0000 [IU] | ORAL_CAPSULE | Freq: Every day | ORAL | Status: DC
Start: 2014-07-20 — End: 2015-12-26

## 2014-07-20 NOTE — Telephone Encounter (Signed)
Per Dr. Lona Kettle, stop the Megace and start Vitamin E 400 I.U. Po daily for hot flashes. This RN spoke with patient and she verbalized understanding of starting Vit. E. Prescription was e-scribed to pharmacy per patient request.

## 2014-07-20 NOTE — Telephone Encounter (Signed)
Spoke with Deborah Levy she stated that she has stopped the Megace as noted in Dr. Renella Cunas office note dated 07-05-14.  She has not noticed any difference in her hot flashes or how she feels off the Megace.   Deborah Levy is fine with discussing alternative to Megace such as Vitamine E as noted in Dr. Renella Cunas progress note 07-05-14 with Dr. Lona Kettle or Dr. Jana Hakim. Will call patient back with recommendations.

## 2014-09-19 ENCOUNTER — Other Ambulatory Visit: Payer: Self-pay | Admitting: *Deleted

## 2014-09-19 DIAGNOSIS — C50912 Malignant neoplasm of unspecified site of left female breast: Secondary | ICD-10-CM

## 2014-09-19 MED ORDER — VENLAFAXINE HCL ER 75 MG PO CP24: 75.0000 mg | ORAL_CAPSULE | Freq: Every day | ORAL | Status: DC

## 2014-11-06 ENCOUNTER — Other Ambulatory Visit: Payer: Self-pay | Admitting: Gynecology

## 2014-11-07 LAB — CYTOLOGY - PAP

## 2014-11-13 DIAGNOSIS — M797 Fibromyalgia: Secondary | ICD-10-CM | POA: Insufficient documentation

## 2014-11-13 DIAGNOSIS — G8929 Other chronic pain: Secondary | ICD-10-CM | POA: Insufficient documentation

## 2014-11-13 DIAGNOSIS — M7540 Impingement syndrome of unspecified shoulder: Secondary | ICD-10-CM | POA: Insufficient documentation

## 2014-11-15 ENCOUNTER — Telehealth: Payer: Self-pay | Admitting: *Deleted

## 2014-11-15 NOTE — Telephone Encounter (Signed)
Refill for potassium received from [facility] .  Returned fax with request to forward to primary MD - Dr Eligah East.

## 2014-11-22 DIAGNOSIS — E119 Type 2 diabetes mellitus without complications: Secondary | ICD-10-CM | POA: Insufficient documentation

## 2015-01-11 ENCOUNTER — Other Ambulatory Visit: Payer: Self-pay | Admitting: Physician Assistant

## 2015-01-11 DIAGNOSIS — S46012A Strain of muscle(s) and tendon(s) of the rotator cuff of left shoulder, initial encounter: Secondary | ICD-10-CM

## 2015-01-11 DIAGNOSIS — T148XXA Other injury of unspecified body region, initial encounter: Secondary | ICD-10-CM

## 2015-01-16 ENCOUNTER — Other Ambulatory Visit: Payer: Self-pay

## 2015-01-16 DIAGNOSIS — C50912 Malignant neoplasm of unspecified site of left female breast: Secondary | ICD-10-CM

## 2015-01-16 MED ORDER — ANASTROZOLE 1 MG PO TABS: 1.0000 mg | ORAL_TABLET | Freq: Every day | ORAL | Status: DC

## 2015-01-17 ENCOUNTER — Ambulatory Visit
Admission: RE | Admit: 2015-01-17 | Discharge: 2015-01-17 | Disposition: A | Discharge status: Home / Self Care | Payer: BLUE CROSS/BLUE SHIELD | Source: Ambulatory Visit | Attending: Physician Assistant | Admitting: Physician Assistant

## 2015-01-17 DIAGNOSIS — T148XXA Other injury of unspecified body region, initial encounter: Secondary | ICD-10-CM

## 2015-01-17 DIAGNOSIS — S46012A Strain of muscle(s) and tendon(s) of the rotator cuff of left shoulder, initial encounter: Secondary | ICD-10-CM

## 2015-03-19 ENCOUNTER — Encounter (INDEPENDENT_AMBULATORY_CARE_PROVIDER_SITE_OTHER): Payer: Self-pay | Admitting: Ophthalmology

## 2015-07-03 ENCOUNTER — Other Ambulatory Visit: Payer: Self-pay | Admitting: *Deleted

## 2015-07-03 DIAGNOSIS — C50912 Malignant neoplasm of unspecified site of left female breast: Secondary | ICD-10-CM

## 2015-07-04 ENCOUNTER — Ambulatory Visit (HOSPITAL_BASED_OUTPATIENT_CLINIC_OR_DEPARTMENT_OTHER): Payer: BLUE CROSS/BLUE SHIELD | Admitting: Oncology

## 2015-07-04 ENCOUNTER — Other Ambulatory Visit (HOSPITAL_BASED_OUTPATIENT_CLINIC_OR_DEPARTMENT_OTHER): Payer: BLUE CROSS/BLUE SHIELD

## 2015-07-04 VITALS — BP 134/76 | HR 93 | Temp 98.5°F | Resp 18 | Ht 59.75 in | Wt 179.5 lb

## 2015-07-04 DIAGNOSIS — M797 Fibromyalgia: Secondary | ICD-10-CM | POA: Diagnosis not present

## 2015-07-04 DIAGNOSIS — L930 Discoid lupus erythematosus: Secondary | ICD-10-CM | POA: Diagnosis not present

## 2015-07-04 DIAGNOSIS — C50312 Malignant neoplasm of lower-inner quadrant of left female breast: Secondary | ICD-10-CM

## 2015-07-04 DIAGNOSIS — C50912 Malignant neoplasm of unspecified site of left female breast: Secondary | ICD-10-CM

## 2015-07-04 LAB — CBC WITH DIFFERENTIAL/PLATELET
BASO%: 0.2 % (ref 0.0–2.0)
Basophils Absolute: 0 10*3/uL (ref 0.0–0.1)
EOS%: 2.9 % (ref 0.0–7.0)
Eosinophils Absolute: 0.1 10*3/uL (ref 0.0–0.5)
HEMATOCRIT: 36.3 % (ref 34.8–46.6)
HEMOGLOBIN: 12.2 g/dL (ref 11.6–15.9)
LYMPH#: 1.4 10*3/uL (ref 0.9–3.3)
LYMPH%: 30.9 % (ref 14.0–49.7)
MCH: 30.7 pg (ref 25.1–34.0)
MCHC: 33.6 g/dL (ref 31.5–36.0)
MCV: 91.4 fL (ref 79.5–101.0)
MONO#: 0.4 10*3/uL (ref 0.1–0.9)
MONO%: 8.6 % (ref 0.0–14.0)
NEUT%: 57.4 % (ref 38.4–76.8)
NEUTROS ABS: 2.6 10*3/uL (ref 1.5–6.5)
PLATELETS: 265 10*3/uL (ref 145–400)
RBC: 3.97 10*6/uL (ref 3.70–5.45)
RDW: 14.4 % (ref 11.2–14.5)
WBC: 4.6 10*3/uL (ref 3.9–10.3)

## 2015-07-04 LAB — COMPREHENSIVE METABOLIC PANEL (CC13)
ALT: 30 U/L (ref 0–55)
AST: 29 U/L (ref 5–34)
Albumin: 3.9 g/dL (ref 3.5–5.0)
Alkaline Phosphatase: 86 U/L (ref 40–150)
Anion Gap: 7 mEq/L (ref 3–11)
BILIRUBIN TOTAL: 0.24 mg/dL (ref 0.20–1.20)
BUN: 13.9 mg/dL (ref 7.0–26.0)
CO2: 27 mEq/L (ref 22–29)
Calcium: 9.4 mg/dL (ref 8.4–10.4)
Chloride: 107 mEq/L (ref 98–109)
Creatinine: 0.8 mg/dL (ref 0.6–1.1)
EGFR: 82 mL/min/{1.73_m2} — ABNORMAL LOW (ref >90)
GLUCOSE: 95 mg/dL (ref 70–140)
POTASSIUM: 4.4 meq/L (ref 3.5–5.1)
SODIUM: 141 meq/L (ref 136–145)
Total Protein: 6.8 g/dL (ref 6.4–8.3)

## 2015-07-04 NOTE — Progress Notes (Signed)
ID: Deborah Levy   DOB: January 01, 1956  MR#: [id number]  YQM#:[id number]  PCP: No primary care provider on file. GYN: Deborah Pereyra MD SU: OTHER MDs: Deborah Huntsman MD  CHIEF CONCERN: Estrogen receptor positive breast cancer  CURRENT TREATMENT: Anastrozole  HISTORY OF PRESENT ILLNESS: From the original intake note:  Deborah Levy had a screening mammogram at the [facility] in August of 2010, which showed a possible asymmetry in the left breast.  She was recalled for additional views July 26, 2009 and these were felt to be most likely benign findings, as they appeared essentially unchanged as compared to mammography from 2007.  However, she was set up for six-month followup and on February 11, 2011, with repeat left mammogram and ultrasound, Dr. Sadie Levy was able to locate a small spiculated mass in the left lower inner quadrant posteriorly, it was not palpable by physical exam, but ultrasound did show an irregularly marginated solid mass measuring a maximum of 6 mm.  The axilla was negative.  This was felt sufficiently suspicious that the patient was called back for biopsy and this was performed on March 5.  The pathology [id number]) showed an invasive lobular carcinoma grade 2 with no HER2/neu amplification, but strongly estrogen and progesterone receptor positive at 97% and 59% respectively.  The MIB-1 was low at 12%.  With this information the patient was referred for bilateral breast MRIs, and these were performed March 9.  The MRI confirmed a mass measuring 9 mm in the lower inner quadrant of the left breast, irregular and enhancing.  There were no other masses and no suspicious lymph nodes.    The patient underwent left lumpectomy and sentinel lymph node sampling in March 2012 for her invasive lobular breast carcinoma, T1 a N0, grade 1. Tumor was strongly ER and PR positive, HER-2/neu negative, with an MIB-1 of 12%. Margins were ample. Her subsequent history is as detailed below.   INTERVAL  HISTORY: Deborah Levy returns today for follow-up of her very early breast cancer. She continues on anastrozole. She is tolerating that remarkably well. She does have hot flashes but tells she has had those for about 20 years and they're no worse than before. She does have back pain and joint pain but again this is not more insistent or intense than previously. She does not know how much the drug is costing. I did ask her to check in case there over paying  REVIEW OF SYSTEMS: She is working all the time and not getting any exercise. She sleeps poorly. She feels tired. She is losing her hearing. She has some ear drainage sometimes. Her dentures fit poorly. She feels short of breath when walking up stairs. She has some stress urinary incontinence but tells me her chiropractor helped that. She bruises easily. She has occasional headaches sometimes she feels weak, she feels forgetful, and she admits to anxiety and depression. She tells me her lupus is currently well-controlled. Detailed review of systems was otherwise stable  PAST MEDICAL HISTORY: Past Medical History  Diagnosis Date  . Lupus   . Fibromyalgia   . Hypertension   . Cancer   . GERD (gastroesophageal reflux disease)     PAST SURGICAL HISTORY: Past Surgical History  Procedure Laterality Date  . Shoulder surgery    . Knee surgery    . Tubal ligation    . Breast surgery  03/10/2011    Lt br lumpectomy    FAMILY HISTORY Family History  Problem Relation Age of Onset  .  Cancer Father     colon  . Hypertension Father   . Heart disease Father   . Cancer Sister     Cervical  . Heart disease Maternal Grandfather   . Hypertension Paternal Grandmother   . Heart disease Paternal Grandmother     GYNECOLOGIC HISTORY:  She had menarche in her teens.  Last menstrual period about age 30.  She did use hormone replacement, did so for about 15 years and stopped with her breast cancer diagnosis. She was 59 years old when she birthed her first  child.  She is Gx, P1.  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  She has been married eight years to Deborah Levy, who works at a Chartered certified accountant.  The patient's daughter from a prior marriage is Deborah Levy , who lives in [place] and works as a Air traffic controller.  The patient has two step children.  They are Deborah Levy's children from a prior marriage.  One of them is Deborah Levy who works as a Marine Levy with Dr. Dagmar Levy here in [place] and [place] who lives in [place] and works in Programmer, applications.  They have no grandchildren.  The patient is not a church attender.  HEALTH MAINTENANCE: History  Substance Use Topics  . Smoking status: Former Research Levy (life sciences)  . Smokeless tobacco: Former Systems developer    Quit date: 02/22/1997  . Alcohol Use: No     Colonoscopy:  PAP: Dr. Carren Levy  Bone density:  Scheduled for 07/26/2013  Lipid panel:  UTD, Dr. Seth Levy  Allergies  Allergen Reactions  . Sulfa Antibiotics Nausea Only    Current Outpatient Prescriptions  Medication Sig Dispense Refill  . anastrozole (ARIMIDEX) 1 MG tablet Take 1 tablet (1 mg total) by mouth daily. 30 tablet 5  . aspirin 81 MG tablet Take 81 mg by mouth daily.    . Biotin 5000 MCG CAPS Take 2 capsules by mouth daily.     . Calcium Carbonate-Vitamin D (CALCIUM 600 + D PO) Take 1 tablet by mouth 2 (two) times daily.      . furosemide (LASIX) 40 MG tablet Take 40 mg by mouth daily.    Marland Kitchen lisinopril (PRINIVIL,ZESTRIL) 10 MG tablet Take 10 mg by mouth daily.      . magnesium oxide (MAG-OX) 400 MG tablet Take 250 mg by mouth daily.     Marland Kitchen omeprazole (PRILOSEC) 40 MG capsule     . potassium chloride SA (K-DUR,KLOR-CON) 20 MEQ tablet Take 1 tablet (20 mEq total) by mouth daily. 30 tablet 1  . pregabalin (LYRICA) 100 MG capsule Take 300 mg by mouth daily.     . simvastatin (ZOCOR) 40 MG tablet Take 40 mg by mouth at bedtime.     Marland Kitchen venlafaxine XR (EFFEXOR-XR) 75 MG 24 hr capsule Take 1 capsule (75 mg total) by mouth daily. 30 capsule 9  .  vitamin E (VITAMIN E) 400 UNIT capsule Take 1 capsule (400 Units total) by mouth daily. 90 capsule 3  . zolpidem (AMBIEN) 10 MG tablet Take 10 mg by mouth at bedtime.      . [DISCONTINUED] DULoxetine (CYMBALTA) 60 MG capsule Take 60 mg by mouth 2 (two) times daily.      . [DISCONTINUED] esomeprazole (NEXIUM) 40 MG capsule Take 40 mg by mouth daily.       No current facility-administered medications for this visit.    OBJECTIVE: Middle-aged white woman in no acute distress Filed Vitals:   07/04/15 1607  BP: 134/76  Pulse:  93  Temp: 98.5 F (36.9 C)  Resp: 18     Body mass index is 35.33 kg/(m^2).    ECOG FS: 1 Filed Weights   07/04/15 1607  Weight: 179 lb 8 oz (81.421 kg)   Sclerae unicteric, EOMs intact Oropharynx clear, full upper plate No cervical or supraclavicular adenopathy Lungs no rales or rhonchi Heart regular rate and rhythm Abd soft, obese, nontender, positive bowel sounds MSK no focal spinal tenderness, no upper extremity lymphedema Neuro: nonfocal, well oriented, appropriate affect Breasts: The right breast is unremarkable per the left breast is status post lumpectomy and radiation. There is no evidence of local recurrence. The left axilla is benign.    LAB RESULTS: Lab Results  Component Value Date   WBC 4.6 07/04/2015   NEUTROABS 2.6 07/04/2015   HGB 12.2 07/04/2015   HCT 36.3 07/04/2015   MCV 91.4 07/04/2015   PLT 265 07/04/2015      Chemistry      Component Value Date/Time   NA 141 07/04/2015 1536   NA 140 03/05/2012 1107   K 4.4 07/04/2015 1536   K 4.0 03/05/2012 1107   CL 103 04/14/2013 1504   CL 104 03/05/2012 1107   CO2 27 07/04/2015 1536   CO2 27 03/05/2012 1107   BUN 13.9 07/04/2015 1536   BUN 15 03/05/2012 1107   CREATININE 0.8 07/04/2015 1536   CREATININE 0.96 03/05/2012 1107      Component Value Date/Time   CALCIUM 9.4 07/04/2015 1536   CALCIUM 9.1 03/05/2012 1107   ALKPHOS 86 07/04/2015 1536   ALKPHOS 67 03/05/2012 1107   AST  29 07/04/2015 1536   AST 19 03/05/2012 1107   ALT 30 07/04/2015 1536   ALT 13 03/05/2012 1107   BILITOT 0.24 07/04/2015 1536   BILITOT 0.3 03/05/2012 1107        STUDIES: Mammography at [facility] April 2016 was normal according to patient. I have not received a copy  ASSESSMENT: 59 y.o. [place] woman   (1)  status post left lumpectomy and sentinel lymph node sampling March 2012 for a T1a N0, stage IA invasive lobular breast cancer, grade 1, strongly estrogen and progesterone receptor positive, with an MIB-1 of 12%, no HER-2 amplification and ample margins,   (2)  status post radiation completed in June 2012,   (3)  on tamoxifen since July 2012 with good tolerance, but with the development of postmenopausal vaginal bleeding and polyps.   (4)  discontinued tamoxifen and started on anastrozole in may 2014; bone density August 2014 was normal  (5)  comorbidities include fibromyalgia and discoid lupus.    PLAN:  Shasta Chinn continues on anastrozole and though she does have some side effects from that she is tolerating it generally well. She is very motivated to complete a total of 5 years, which of course is the standard of care. She will see me again next July and we'll "graduate" at that point.  Between now and then I have encouraged her to start an exercise program. I think a lot of her aches and pains might get better if she were more active. The problem of course is her long work hours.  I gave her a copy of her lab work today, which is generally fine, so she can share it with her other physicians we'll may not be on Epic and to  minimize duplication.  She knows to call for any problems that may develop before her next visit here  Chili C  07/04/2015   

## 2015-07-05 ENCOUNTER — Telehealth: Payer: Self-pay | Admitting: Oncology

## 2015-07-05 NOTE — Telephone Encounter (Signed)
Confirmed appointment for July 2017

## 2015-11-09 ENCOUNTER — Other Ambulatory Visit: Payer: Self-pay | Admitting: *Deleted

## 2015-11-09 DIAGNOSIS — C50912 Malignant neoplasm of unspecified site of left female breast: Secondary | ICD-10-CM

## 2015-11-09 MED ORDER — VENLAFAXINE HCL ER 75 MG PO CP24: 75.0000 mg | ORAL_CAPSULE | Freq: Every day | ORAL | Status: DC

## 2015-11-21 ENCOUNTER — Other Ambulatory Visit: Payer: Self-pay | Admitting: *Deleted

## 2015-11-21 DIAGNOSIS — C50912 Malignant neoplasm of unspecified site of left female breast: Secondary | ICD-10-CM

## 2015-11-21 MED ORDER — ANASTROZOLE 1 MG PO TABS: 1.0000 mg | ORAL_TABLET | Freq: Every day | ORAL | Status: DC

## 2015-12-26 ENCOUNTER — Encounter: Payer: Self-pay | Admitting: Family Medicine

## 2015-12-26 ENCOUNTER — Ambulatory Visit (INDEPENDENT_AMBULATORY_CARE_PROVIDER_SITE_OTHER): Payer: BLUE CROSS/BLUE SHIELD | Admitting: Family Medicine

## 2015-12-26 ENCOUNTER — Telehealth: Payer: Self-pay | Admitting: Family Medicine

## 2015-12-26 VITALS — BP 129/84 | HR 88 | Temp 98.5°F | Resp 18 | Ht 60.0 in | Wt 168.4 lb

## 2015-12-26 DIAGNOSIS — J069 Acute upper respiratory infection, unspecified: Secondary | ICD-10-CM

## 2015-12-26 DIAGNOSIS — R05 Cough: Secondary | ICD-10-CM

## 2015-12-26 DIAGNOSIS — Z853 Personal history of malignant neoplasm of breast: Secondary | ICD-10-CM | POA: Diagnosis not present

## 2015-12-26 DIAGNOSIS — R059 Cough, unspecified: Secondary | ICD-10-CM

## 2015-12-26 DIAGNOSIS — Z1211 Encounter for screening for malignant neoplasm of colon: Secondary | ICD-10-CM

## 2015-12-26 MED ORDER — BENZONATATE 100 MG PO CAPS: 100.0000 mg | ORAL_CAPSULE | Freq: Three times a day (TID) | ORAL | Status: DC | PRN

## 2015-12-26 MED ORDER — AMOXICILLIN 875 MG PO TABS: 875.0000 mg | ORAL_TABLET | Freq: Two times a day (BID) | ORAL | Status: DC

## 2015-12-26 NOTE — Progress Notes (Signed)
Subjective:    Patient ID: Deborah Levy, female    DOB: [birth year], 60 y.o.   MRN: [id number]  HPI This is a pleasant 60 yo female who presents today with 4 days of cough, sore throat, ear pain, chest soreness. No wheezing, no SOB, aching all over. Tried Mucinex DM with slight relief. Slight runny nose and occasional headache. Feels better this morning. Doesn't get sick often. No known sick contacts. No past or recent history of asthma. Stopped smoking 1998.   Overdue for mammogram. Will order diagnostic mammo. Had colonoscopy, not sure when. Had in [facility]. Sees Dr. Patrecia Pour for her lupus and fibromyalgia. Sees Dr. Jana Hakim for breast cancer follow up. Diagnosed 5 years ago. On anastrazole.  Past Medical History  Diagnosis Date  . Lupus ([place])   . Fibromyalgia   . Hypertension   . Cancer ([place])   . GERD (gastroesophageal reflux disease)    Past Surgical History  Procedure Laterality Date  . Shoulder surgery    . Knee surgery    . Tubal ligation    . Breast surgery  03/10/2011    Lt br lumpectomy   Family History  Problem Relation Age of Onset  . Cancer Father     colon  . Hypertension Father   . Heart disease Father   . Cancer Sister     Cervical  . Heart disease Maternal Grandfather   . Hypertension Paternal Grandmother   . Heart disease Paternal Grandmother    Social History  Substance Use Topics  . Smoking status: Former Research scientist (life sciences)  . Smokeless tobacco: Former Systems developer    Quit date: 02/22/1997  . Alcohol Use: No   Review of Systems Per HPI    Objective:   Physical Exam  Constitutional: She is oriented to person, place, and time. She appears well-developed and well-nourished. No distress.  HENT:  Head: Normocephalic and atraumatic.  Right Ear: Tympanic membrane, external ear and ear canal normal.  Left Ear: Tympanic membrane, external ear and ear canal normal.  Nose: Mucosal edema and rhinorrhea present.  Mouth/Throat: Oropharynx is clear and moist and mucous  membranes are normal.  Eyes: Conjunctivae are normal.  Neck: Normal range of motion. Neck supple.  Cardiovascular: Normal rate, regular rhythm and normal heart sounds.   Pulmonary/Chest: Effort normal and breath sounds normal.  Musculoskeletal: Normal range of motion.  Lymphadenopathy:    She has no cervical adenopathy.  Neurological: She is alert and oriented to person, place, and time.  Skin: Skin is warm and dry. She is not diaphoretic.  Psychiatric: She has a normal mood and affect. Her behavior is normal. Judgment and thought content normal.  Vitals reviewed.  BP 129/84 mmHg  Pulse 88  Temp(Src) 98.5 F (36.9 C) (Oral)  Resp 18  Ht 5' (1.524 m)  Wt 168 lb 6.4 oz (76.386 kg)  BMI 32.89 kg/m2  SpO2 95%     Assessment & Plan:  1. Acute upper respiratory infection - suspect this could be viral, encouraged patient to wait 1-2 days before starting antibioticf - maximize supportive care- increase fluids, acetaminophen/ibuprofen for sore throat/myalgias, continue Mucinex.  - amoxicillin (AMOXIL) 875 MG tablet; Take 1 tablet (875 mg total) by mouth 2 (two) times daily.  Dispense: 20 tablet; Refill: 0 - RTC precautions- fever, SOB/wheeze, no improvement in 4-5 days 2. Cough - benzonatate (TESSALON) 100 MG capsule; Take 1-2 capsules (100-200 mg total) by mouth 3 (three) times daily as needed for cough.  Dispense: 40 capsule;  Refill: 0  3. History of breast cancer - MM Digital Diagnostic Bilat; Future  - Will try to find out when and where she had screening colonoscopy- unable to verify. Patient reported 2009 with recall 2015. Will go ahead and put in order for screening colonoscopy, patient agreeable.   Clarene Reamer, FNP-BC  [facility]  12/26/2015 9:28 AM

## 2015-12-26 NOTE — Patient Instructions (Addendum)
Look at your pharmacy plan and see if it is more cost effective to mail order your medication Try the website www.goodrx.com to see if your medication is less expensive somewhere else.

## 2015-12-26 NOTE — Telephone Encounter (Signed)
Spoke with patient and she would like for [place] to refer her for a colonoscopy.

## 2015-12-28 ENCOUNTER — Other Ambulatory Visit: Payer: Self-pay

## 2015-12-28 DIAGNOSIS — Z1231 Encounter for screening mammogram for malignant neoplasm of breast: Secondary | ICD-10-CM

## 2016-01-24 ENCOUNTER — Other Ambulatory Visit: Payer: Self-pay | Admitting: Oncology

## 2016-01-24 DIAGNOSIS — Z853 Personal history of malignant neoplasm of breast: Secondary | ICD-10-CM

## 2016-02-13 ENCOUNTER — Other Ambulatory Visit: Payer: Self-pay

## 2016-02-13 ENCOUNTER — Other Ambulatory Visit: Payer: Self-pay | Admitting: Oncology

## 2016-02-13 DIAGNOSIS — Z853 Personal history of malignant neoplasm of breast: Secondary | ICD-10-CM

## 2016-03-04 ENCOUNTER — Ambulatory Visit
Admission: RE | Admit: 2016-03-04 | Discharge: 2016-03-04 | Disposition: A | Discharge status: Home / Self Care | Payer: BLUE CROSS/BLUE SHIELD | Source: Ambulatory Visit | Attending: Oncology | Admitting: Oncology

## 2016-06-18 ENCOUNTER — Other Ambulatory Visit: Payer: Self-pay

## 2016-06-18 DIAGNOSIS — C50912 Malignant neoplasm of unspecified site of left female breast: Secondary | ICD-10-CM

## 2016-06-19 ENCOUNTER — Other Ambulatory Visit (HOSPITAL_BASED_OUTPATIENT_CLINIC_OR_DEPARTMENT_OTHER): Payer: BLUE CROSS/BLUE SHIELD

## 2016-06-19 DIAGNOSIS — C50912 Malignant neoplasm of unspecified site of left female breast: Secondary | ICD-10-CM

## 2016-06-19 LAB — CBC WITH DIFFERENTIAL/PLATELET
BASO%: 0.4 % (ref 0.0–2.0)
BASOS ABS: 0 10*3/uL (ref 0.0–0.1)
EOS ABS: 0.1 10*3/uL (ref 0.0–0.5)
EOS%: 1.6 % (ref 0.0–7.0)
HEMATOCRIT: 37.1 % (ref 34.8–46.6)
HEMOGLOBIN: 12.7 g/dL (ref 11.6–15.9)
LYMPH#: 1.6 10*3/uL (ref 0.9–3.3)
LYMPH%: 28.4 % (ref 14.0–49.7)
MCH: 31.4 pg (ref 25.1–34.0)
MCHC: 34.2 g/dL (ref 31.5–36.0)
MCV: 91.6 fL (ref 79.5–101.0)
MONO#: 0.6 10*3/uL (ref 0.1–0.9)
MONO%: 11.1 % (ref 0.0–14.0)
NEUT%: 58.5 % (ref 38.4–76.8)
NEUTROS ABS: 3.2 10*3/uL (ref 1.5–6.5)
Platelets: 242 10*3/uL (ref 145–400)
RBC: 4.05 10*6/uL (ref 3.70–5.45)
RDW: 14.2 % (ref 11.2–14.5)
WBC: 5.5 10*3/uL (ref 3.9–10.3)

## 2016-06-19 LAB — COMPREHENSIVE METABOLIC PANEL
ALBUMIN: 3.9 g/dL (ref 3.5–5.0)
ALK PHOS: 91 U/L (ref 40–150)
ALT: 32 U/L (ref 0–55)
AST: 27 U/L (ref 5–34)
Anion Gap: 10 mEq/L (ref 3–11)
BILIRUBIN TOTAL: 0.35 mg/dL (ref 0.20–1.20)
BUN: 17 mg/dL (ref 7.0–26.0)
CALCIUM: 9.2 mg/dL (ref 8.4–10.4)
CO2: 28 mEq/L (ref 22–29)
CREATININE: 0.9 mg/dL (ref 0.6–1.1)
Chloride: 105 mEq/L (ref 98–109)
EGFR: 65 mL/min/{1.73_m2} — ABNORMAL LOW (ref >90)
Glucose: 112 mg/dl (ref 70–140)
POTASSIUM: 3.8 meq/L (ref 3.5–5.1)
Sodium: 143 mEq/L (ref 136–145)
TOTAL PROTEIN: 7 g/dL (ref 6.4–8.3)

## 2016-06-26 ENCOUNTER — Ambulatory Visit (HOSPITAL_BASED_OUTPATIENT_CLINIC_OR_DEPARTMENT_OTHER): Payer: BLUE CROSS/BLUE SHIELD | Admitting: Oncology

## 2016-06-26 VITALS — BP 154/69 | HR 94 | Temp 98.1°F | Resp 20 | Ht 60.0 in | Wt 175.4 lb

## 2016-06-26 DIAGNOSIS — M791 Myalgia: Secondary | ICD-10-CM | POA: Diagnosis not present

## 2016-06-26 DIAGNOSIS — Z853 Personal history of malignant neoplasm of breast: Secondary | ICD-10-CM

## 2016-06-26 NOTE — Progress Notes (Signed)
Elected ID: Deborah Levy   DOB: 10-Mar-1956  MR#: [id number]  VOJ#:[id number]  PCP: Serita Grit, PA-C GYN: Delila Pereyra MD SU: OTHER MDs: Milana Huntsman MD  CHIEF CONCERN: Estrogen receptor positive breast cancer  CURRENT TREATMENT: Completed 5 years of anti-estrogens   HISTORY OF PRESENT ILLNESS: From the original intake note:  Deborah Levy had a screening mammogram at the [facility] in August of 2010, which showed a possible asymmetry in the left breast.  She was recalled for additional views July 26, 2009 and these were felt to be most likely benign findings, as they appeared essentially unchanged as compared to mammography from 2007.  However, she was set up for six-month followup and on February 11, 2011, with repeat left mammogram and ultrasound, Dr. Sadie Haber was able to locate a small spiculated mass in the left lower inner quadrant posteriorly, it was not palpable by physical exam, but ultrasound did show an irregularly marginated solid mass measuring a maximum of 6 mm.  The axilla was negative.  This was felt sufficiently suspicious that the patient was called back for biopsy and this was performed on March 5.  The pathology [id number]) showed an invasive lobular carcinoma grade 2 with no HER2/neu amplification, but strongly estrogen and progesterone receptor positive at 97% and 59% respectively.  The MIB-1 was low at 12%.  With this information the patient was referred for bilateral breast MRIs, and these were performed March 9.  The MRI confirmed a mass measuring 9 mm in the lower inner quadrant of the left breast, irregular and enhancing.  There were no other masses and no suspicious lymph nodes.    The patient underwent left lumpectomy and sentinel lymph node sampling in March 2012 for her invasive lobular breast carcinoma, T1 a N0, grade 1. Tumor was strongly ER and PR positive, HER-2/neu negative, with an MIB-1 of 12%. Margins were ample. Her subsequent history is as detailed  below.   INTERVAL HISTORY: [name] returns today for follow-up of her estrogen receptor positive breast cancer. She is completing 5 years of anastrozole. She has tolerated that generally well although she still has "horrible" hot flashes. That was the main side effect from that medication. She never developed the arthralgias and myalgias that some patients can experience on.  REVIEW OF SYSTEMS: She continues to have problems with fibromyalgia particularly affecting her legs and hands but overall she is doing "really well". She is moderately fatigued, but working full-time. Her dentures do not fit well. She bruises easily. She is forgetful but not anxious or depressed. A detailed review of systems today was otherwise stable.  PAST MEDICAL HISTORY: Past Medical History  Diagnosis Date  . Lupus ([place])   . Fibromyalgia   . Hypertension   . Cancer ([place])   . GERD (gastroesophageal reflux disease)     PAST SURGICAL HISTORY: Past Surgical History  Procedure Laterality Date  . Shoulder surgery    . Knee surgery    . Tubal ligation    . Breast surgery  03/10/2011    Lt br lumpectomy    FAMILY HISTORY Family History  Problem Relation Age of Onset  . Cancer Father     colon  . Hypertension Father   . Heart disease Father   . Cancer Sister     Cervical  . Heart disease Maternal Grandfather   . Hypertension Paternal Grandmother   . Heart disease Paternal Grandmother     GYNECOLOGIC HISTORY:  She had menarche in her teens.  Last menstrual period about age 60.  She did use hormone replacement, did so for about 15 years and stopped with her breast cancer diagnosis. She was 60 years old when she birthed her first child.  She is Gx, P1.  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  She has been married eight years to [employer], who works at a Chartered certified accountant.  The patient's daughter from a prior marriage is Deborah Levy , who lives in [place] and works as a  Air traffic controller.  The patient has two step children.  They are Ray's children from a prior marriage.  One of them is Water quality scientist who works as a Marine scientist with Dr. Dagmar Hait here in [place] and [place] who lives in [place] and works in Programmer, applications.  They have no grandchildren.  The patient is not a church attender.  HEALTH MAINTENANCE: Social History  Substance Use Topics  . Smoking status: Former Research scientist (life sciences)  . Smokeless tobacco: Former Systems developer    Quit date: 02/22/1997  . Alcohol Use: No     Colonoscopy:  PAP: Dr. Carren Rang  Bone density:  Scheduled for 07/26/2013  Lipid panel:  UTD, Dr. Seth Bake  Allergies  Allergen Reactions  . Sulfa Antibiotics Nausea Only  . Metformin Nausea Only  . Sulfasalazine Nausea Only    Current Outpatient Prescriptions  Medication Sig Dispense Refill  . aspirin 81 MG tablet Take 81 mg by mouth daily.    . Biotin 5000 MCG CAPS Take 2 capsules by mouth daily.     . Calcium Carbonate-Vitamin D (CALCIUM 600 + D PO) Take 1 tablet by mouth 2 (two) times daily. Reported on 12/26/2015    . lisinopril (PRINIVIL,ZESTRIL) 10 MG tablet Take 10 mg by mouth daily.      . magnesium oxide (MAG-OX) 400 MG tablet Take 250 mg by mouth daily.     Marland Kitchen omeprazole (PRILOSEC) 40 MG capsule     . pregabalin (LYRICA) 100 MG capsule Take 300 mg by mouth daily.     . simvastatin (ZOCOR) 40 MG tablet Take 40 mg by mouth at bedtime.     . traMADol (ULTRAM) 50 MG tablet Take 2 tablets (100 mg total) by mouth 2 (two) times daily. 30 tablet   . venlafaxine XR (EFFEXOR-XR) 75 MG 24 hr capsule Take 1 capsule (75 mg total) by mouth daily. 30 capsule 9  . zolpidem (AMBIEN) 10 MG tablet Take 10 mg by mouth at bedtime.      . [DISCONTINUED] DULoxetine (CYMBALTA) 60 MG capsule Take 60 mg by mouth 2 (two) times daily.      . [DISCONTINUED] esomeprazole (NEXIUM) 40 MG capsule Take 40 mg by mouth daily.       No current facility-administered medications for this visit.    OBJECTIVE: Middle-aged white  woman Filed Vitals:   06/26/16 1518  BP: 154/69  Pulse: 94  Temp: 98.1 F (36.7 C)  Resp: 20     Body mass index is 34.26 kg/(m^2).    ECOG FS: 1 Filed Weights   06/26/16 1518  Weight: 175 lb 6.4 oz (79.561 kg)   Sclerae unicteric, pupils round and equal Oropharynx clear and moist-- no thrush or other lesions No cervical or supraclavicular adenopathy Lungs no rales or rhonchi Heart regular rate and rhythm Abd soft, obese, nontender, positive bowel sounds MSK no focal spinal tenderness, no upper extremity lymphedema Neuro: nonfocal, well oriented, appropriate affect Breasts: The right breast is unremarkable. The left breast status post lumpectomy  followed by radiation. There is no evidence of local recurrence. The left axilla is benign.  LAB RESULTS: Lab Results  Component Value Date   WBC 5.5 06/19/2016   NEUTROABS 3.2 06/19/2016   HGB 12.7 06/19/2016   HCT 37.1 06/19/2016   MCV 91.6 06/19/2016   PLT 242 06/19/2016      Chemistry      Component Value Date/Time   NA 143 06/19/2016 1600   NA 140 03/05/2012 1107   K 3.8 06/19/2016 1600   K 4.0 03/05/2012 1107   CL 103 04/14/2013 1504   CL 104 03/05/2012 1107   CO2 28 06/19/2016 1600   CO2 27 03/05/2012 1107   BUN 17.0 06/19/2016 1600   BUN 15 03/05/2012 1107   CREATININE 0.9 06/19/2016 1600   CREATININE 0.96 03/05/2012 1107      Component Value Date/Time   CALCIUM 9.2 06/19/2016 1600   CALCIUM 9.1 03/05/2012 1107   ALKPHOS 91 06/19/2016 1600   ALKPHOS 67 03/05/2012 1107   AST 27 06/19/2016 1600   AST 19 03/05/2012 1107   ALT 32 06/19/2016 1600   ALT 13 03/05/2012 1107   BILITOT 0.35 06/19/2016 1600   BILITOT 0.3 03/05/2012 1107        STUDIES: CLINICAL DATA: Annual diagnostic mammogram. Patient underwent left lumpectomy for breast carcinoma in 2012. No current complaints.  EXAM: 2D DIGITAL DIAGNOSTIC BILATERAL MAMMOGRAM WITH CAD AND ADJUNCT TOMO  COMPARISON: Previous exam(s).  ACR Breast  Density Category b: There are scattered areas of fibroglandular density.  FINDINGS: Architectural distortion in the lower inner left breast reflecting the lumpectomy scar is unchanged.  There are no discrete masses or other areas of architectural distortion. There are no suspicious calcifications.  Mammographic images were processed with CAD.  IMPRESSION: No evidence of recurrent or new breast malignancy. Benign postsurgical changes on the left.  RECOMMENDATION: Diagnostic mammography in 1 year per standard post lumpectomy protocol.  I have discussed the findings and recommendations with the patient. Results were also provided in writing at the conclusion of the visit. If applicable, a reminder letter will be sent to the patient regarding the next appointment.  BI-RADS CATEGORY 2: Benign.   Electronically Signed  By: Lajean Manes M.D.  On: 03/04/2016 15:50  ASSESSMENT: 60 y.o. [place] woman   (1)  status post left Breast lower inner quadrant lumpectomy and sentinel lymph node sampling March 2012 for a T1a N0, stage IA invasive lobular breast cancer, grade 1, strongly estrogen and progesterone receptor positive, with an MIB-1 of 12%, no HER-2 amplification and ample margins,   (2)  status post radiation completed in June 2012,   (3)  on tamoxifen since July 2012 with good tolerance, but with the development of postmenopausal vaginal bleeding and polyps.   (4)  discontinued tamoxifen and started on anastrozole in May 2014; stopped July 27  (a) bone density August 2014 was normal  (5)  comorbidities include fibromyalgia and discoid lupus.    PLAN:  Luzelena Heeg is now more than 5 years out from definitive surgery for her breast cancer with no evidence of disease recurrence. This is very favorable.  She has completed 5 years of anti-estrogens. We have data for continuing 5 an additional years, but the benefit is very small. I'm very comfortable stopping  anastrozole at this point and releasing her from oncologic follow-up. She is very enthusiastic about this possibility.  As far as breast cancer follow-up is concerned a she will need is yearly mammography and  a yearly physician breast exam.  I will be glad to see LuAnn at any point in the future if on when the need arises, but as of now we are making no further routine appointments for her here  [place] [facility]    06/26/2016

## 2016-07-03 ENCOUNTER — Other Ambulatory Visit: Payer: BLUE CROSS/BLUE SHIELD

## 2016-07-10 ENCOUNTER — Ambulatory Visit: Payer: BLUE CROSS/BLUE SHIELD | Admitting: Oncology

## 2016-09-16 ENCOUNTER — Ambulatory Visit (INDEPENDENT_AMBULATORY_CARE_PROVIDER_SITE_OTHER): Payer: BLUE CROSS/BLUE SHIELD | Admitting: Family Medicine

## 2016-09-16 VITALS — BP 120/78 | HR 87 | Temp 97.8°F | Resp 17 | Ht 60.0 in | Wt 174.0 lb

## 2016-09-16 DIAGNOSIS — R51 Headache: Secondary | ICD-10-CM

## 2016-09-16 DIAGNOSIS — N3001 Acute cystitis with hematuria: Secondary | ICD-10-CM

## 2016-09-16 DIAGNOSIS — R350 Frequency of micturition: Secondary | ICD-10-CM | POA: Diagnosis not present

## 2016-09-16 DIAGNOSIS — R110 Nausea: Secondary | ICD-10-CM | POA: Diagnosis not present

## 2016-09-16 DIAGNOSIS — R739 Hyperglycemia, unspecified: Secondary | ICD-10-CM

## 2016-09-16 DIAGNOSIS — R42 Dizziness and giddiness: Secondary | ICD-10-CM

## 2016-09-16 DIAGNOSIS — R519 Headache, unspecified: Secondary | ICD-10-CM

## 2016-09-16 LAB — POCT CBC
Granulocyte percent: 57.5 %G (ref 37–80)
HCT, POC: 36.5 % — AB (ref 37.7–47.9)
HEMOGLOBIN: 13.1 g/dL (ref 12.2–16.2)
LYMPH, POC: 1.3 (ref 0.6–3.4)
MCH: 32.6 pg — AB (ref 27–31.2)
MCHC: 35.8 g/dL — AB (ref 31.8–35.4)
MCV: 91.2 fL (ref 80–97)
MID (CBC): 0.4 (ref 0–0.9)
MPV: 7.6 fL (ref 0–99.8)
PLATELET COUNT, POC: 239 10*3/uL (ref 142–424)
POC Granulocyte: 2.3 (ref 2–6.9)
POC LYMPH PERCENT: 32.4 %L (ref 10–50)
POC MID %: 10.1 %M (ref 0–12)
RBC: 4 M/uL — AB (ref 4.04–5.48)
RDW, POC: 14.6 %
WBC: 4 10*3/uL — AB (ref 4.6–10.2)

## 2016-09-16 LAB — TSH: TSH: 1.46 m[IU]/L

## 2016-09-16 LAB — POC MICROSCOPIC URINALYSIS (UMFC): Mucus: ABSENT

## 2016-09-16 LAB — POCT URINALYSIS DIP (MANUAL ENTRY)
BILIRUBIN UA: NEGATIVE
GLUCOSE UA: NEGATIVE
Ketones, POC UA: NEGATIVE
NITRITE UA: NEGATIVE
Protein Ur, POC: NEGATIVE
Spec Grav, UA: 1.02
Urobilinogen, UA: 0.2
pH, UA: 7.5

## 2016-09-16 LAB — GLUCOSE, POCT (MANUAL RESULT ENTRY): POC Glucose: 100 mg/dl — AB (ref 70–99)

## 2016-09-16 MED ORDER — MECLIZINE HCL 25 MG PO TABS: 25.0000 mg | ORAL_TABLET | Freq: Three times a day (TID) | ORAL | 0 refills | Status: DC | PRN

## 2016-09-16 MED ORDER — ONDANSETRON 4 MG PO TBDP: 4.0000 mg | ORAL_TABLET | Freq: Three times a day (TID) | ORAL | 0 refills | Status: DC | PRN

## 2016-09-16 MED ORDER — NITROFURANTOIN MONOHYD MACRO 100 MG PO CAPS: 100.0000 mg | ORAL_CAPSULE | Freq: Two times a day (BID) | ORAL | 0 refills | Status: DC

## 2016-09-16 NOTE — Patient Instructions (Addendum)
With your dizziness, and frequent headaches, I did order a CAT scan to be performed the next few days. However this could also be vertigo, so can try meclizine as prescribed. Make sure you drink plenty of fluids, rest as needed, and do not skip meals.   Additionally you do have some signs of a possible early urinary tract infection, so start the antibiotic one pill twice per day.   For nausea, I did write some Zofran if needed, but this may also be related to dizziness so would start with the meclizine as above. I did check some other blood tests to look at liver causes, but suspect these will be normal.  Recheck in 3 days to determine if other testing needed, or if evaluation with neurology as needed.  Return to the clinic or go to the nearest emergency room if any of your symptoms worsen or new symptoms occur.   Dizziness Dizziness is a common problem. It is a feeling of unsteadiness or light-headedness. You may feel like you are about to faint. Dizziness can lead to injury if you stumble or fall. Anyone can become dizzy, but dizziness is more common in older adults. This condition can be caused by a number of things, including medicines, dehydration, or illness. HOME CARE INSTRUCTIONS Taking these steps may help with your condition: Eating and Drinking  Drink enough fluid to keep your urine clear or pale yellow. This helps to keep you from becoming dehydrated. Try to drink more clear fluids, such as water.  Do not drink alcohol.  Limit your caffeine intake if directed by your health care provider.  Limit your salt intake if directed by your health care provider. Activity  Avoid making quick movements.  Rise slowly from chairs and steady yourself until you feel okay.  In the morning, first sit up on the side of the bed. When you feel okay, stand slowly while you hold onto something until you know that your balance is fine.  Move your legs often if you need to stand in one place for  a long time. Tighten and relax your muscles in your legs while you are standing.  Do not drive or operate heavy machinery if you feel dizzy.  Avoid bending down if you feel dizzy. Place items in your home so that they are easy for you to reach without leaning over. Lifestyle  Do not use any tobacco products, including cigarettes, chewing tobacco, or electronic cigarettes. If you need help quitting, ask your health care provider.  Try to reduce your stress level, such as with yoga or meditation. Talk with your health care provider if you need help. General Instructions  Watch your dizziness for any changes.  Take medicines only as directed by your health care provider. Talk with your health care provider if you think that your dizziness is caused by a medicine that you are taking.  Tell a friend or a family member that you are feeling dizzy. If he or she notices any changes in your behavior, have this person call your health care provider.  Keep all follow-up visits as directed by your health care provider. This is important. SEEK MEDICAL CARE IF:  Your dizziness does not go away.  Your dizziness or light-headedness gets worse.  You feel nauseous.  You have reduced hearing.  You have new symptoms.  You are unsteady on your feet or you feel like the room is spinning. SEEK IMMEDIATE MEDICAL CARE IF:  You vomit or have diarrhea and  are unable to eat or drink anything.  You have problems talking, walking, swallowing, or using your arms, hands, or legs.  You feel generally weak.  You are not thinking clearly or you have trouble forming sentences. It may take a friend or family member to notice this.  You have chest pain, abdominal pain, shortness of breath, or sweating.  Your vision changes.  You notice any bleeding.  You have a headache.  You have neck pain or a stiff neck.  You have a fever.   This information is not intended to replace advice given to you by your  health care provider. Make sure you discuss any questions you have with your health care provider.   Document Released: 05/27/2001 Document Revised: 04/17/2015 Document Reviewed: 11/27/2014 Elsevier Interactive Patient Education 2016 [employer].   IF you received an x-ray today, you will receive an invoice from [facility]. Please contact [facility] at [phone] with questions or concerns regarding your invoice.   IF you received labwork today, you will receive an invoice from [employer]. Please contact [facility] at [phone] with questions or concerns regarding your invoice.   Our billing staff will not be able to assist you with questions regarding bills from these companies.  You will be contacted with the lab results as soon as they are available. The fastest way to get your results is to activate your My Chart account. Instructions are located on the last page of this paperwork. If you have not heard from [place] regarding the results in 2 weeks, please contact this office.

## 2016-09-16 NOTE — Progress Notes (Addendum)
Subjective:  By signing my name below, I, Moises Blood, attest that this documentation has been prepared under the direction and in the presence of Merri Ray, MD. Electronically Signed: Moises Blood, [place]. 09/16/2016 , 11:39 AM .  Patient was seen in Room 14 .   Patient ID: Deborah Levy, female    DOB: 12/18/1955, 60 y.o.   MRN: [id number] Chief Complaint  Patient presents with  . Dizziness  . Nausea    Denies V/. PHQ9score 20   HPI Deborah Levy is a 60 y.o. female H/o fibromyalgia, chronic pain, HTN, HLD, pre-DM, lupus and breast cancer. PCP is RAINWATER,MARVIN K, PA-C. Breast cancer was diagnosed in 2012: lobular carcinoma grade 2 of left breast. She underwent lumpectomy and completed 5 years of anastrozole. Her medication was stopped last July with oncologist. Today, she presents with nausea and dizziness.   Nausea - dizziness Patient states she's been having nausea, lightheadedness and dizziness ongoing for a couple of weeks, but worsened in the past few days. She's been taking OTC nausea medication. She notes when she walks down a hallway, she "feels like she's bouncing off the walls, similar to being drunk". She has some dizziness even when sitting, and with head movement. She denies abdominal pain. She denies weakness, facial droop, numbness, slurred speech, or syncope. She denies any head injuries.   Headaches She also mentions having daily headaches ongoing for a few months. She had history of severe headaches. She usually has 1~2 cups of coffee in the the morning. She doesn't drink a lot of water. She has increased urinary frequency.   Back Pain She also mentions having a sharp pain in her back which occurred out of nowhere a few days ago. She denies chest pain, shortness of breath or pain radiating down her arms.   Family history Her grandmother had CHF at an older age.  She denies any known early onset of heart disease.   Pre-Diabetes She previously took  metformin but stopped because she was having nausea side effect from taking it.   HTN She receives her HTN medication from another doctor in [place].   Lupus She's followed by Dr. Estanislado Pandy.   Depression Her depression screening was noted.   Depression screen [facility] 2/9 09/16/2016 12/26/2015  Decreased Interest 3 0  Down, Depressed, Hopeless 2 0  PHQ - 2 Score 5 0  Altered sleeping 3 -  Tired, decreased energy 3 -  Change in appetite 3 -  Feeling bad or failure about yourself  3 -  Trouble concentrating 3 -  Moving slowly or fidgety/restless 0 -  Suicidal thoughts 0 -  PHQ-9 Score 20 -     Patient Active Problem List   Diagnosis Date Noted  . Type 2 diabetes mellitus ([place]) 11/22/2014  . Chronic pain 11/13/2014  . Fibromyalgia 11/13/2014  . Rotator cuff impingement syndrome 11/13/2014  . Cannot sleep 02/13/2014  . Essential (primary) hypertension 09/07/2013  . HLD (hyperlipidemia) 09/07/2013  . Adiposity 09/07/2013  . Systemic lupus erythematosus ([place]) 05/21/2013  . Hypokalemia 04/21/2013  . Breast cancer of lower-inner quadrant of left female breast ([place]) 01/12/2012   Past Medical History:  Diagnosis Date  . Cancer ([place])   . Fibromyalgia   . GERD (gastroesophageal reflux disease)   . Hypertension   . Lupus    Past Surgical History:  Procedure Laterality Date  . BREAST SURGERY  03/10/2011   Lt br lumpectomy  . KNEE SURGERY    . SHOULDER SURGERY    .  TUBAL LIGATION     Allergies  Allergen Reactions  . Sulfa Antibiotics Nausea Only  . Metformin Nausea Only  . Sulfasalazine Nausea Only   Prior to Admission medications   Medication Sig Start Date End Date Taking? Authorizing Provider  aspirin 81 MG tablet Take 81 mg by mouth daily.   Yes Historical Provider, MD  Biotin 5000 MCG CAPS Take 2 capsules by mouth daily.    Yes Historical Provider, MD  Calcium Carbonate-Vitamin D (CALCIUM 600 + D PO) Take 1 tablet by mouth 2 (two) times daily. Reported on 12/26/2015    Yes Historical Provider, MD  lisinopril (PRINIVIL,ZESTRIL) 10 MG tablet Take 10 mg by mouth daily.     Yes Historical Provider, MD  omeprazole (PRILOSEC) 40 MG capsule  01/24/12  Yes Historical Provider, MD  pregabalin (LYRICA) 100 MG capsule Take 300 mg by mouth daily.    Yes Historical Provider, MD  simvastatin (ZOCOR) 40 MG tablet Take 40 mg by mouth at bedtime.  12/28/11  Yes Historical Provider, MD  traMADol (ULTRAM) 50 MG tablet Take 2 tablets (100 mg total) by mouth 2 (two) times daily. 07/04/15  Yes Chauncey Cruel, MD  venlafaxine XR (EFFEXOR-XR) 75 MG 24 hr capsule Take 1 capsule (75 mg total) by mouth daily. 11/09/15  Yes Chauncey Cruel, MD  zolpidem (AMBIEN) 10 MG tablet Take 10 mg by mouth at bedtime.     Yes Historical Provider, MD  magnesium oxide (MAG-OX) 400 MG tablet Take 250 mg by mouth daily.     Historical Provider, MD   Social History   Social History  . Marital status: Married    Spouse name: N/A  . Number of children: N/A  . Years of education: N/A   Occupational History  . Not on file.   Social History Main Topics  . Smoking status: Former Research scientist (life sciences)  . Smokeless tobacco: Former Systems developer    Quit date: 02/22/1997  . Alcohol use No  . Drug use: No  . Sexual activity: Yes    Birth control/ protection: Post-menopausal   Other Topics Concern  . Not on file   Social History Narrative  . No narrative on file   Review of Systems  Constitutional: Negative for chills, fatigue and fever.  Gastrointestinal: Positive for nausea. Negative for diarrhea and vomiting.  Genitourinary: Positive for frequency.  Musculoskeletal: Negative for back pain.  Neurological: Positive for dizziness, light-headedness and headaches. Negative for syncope, speech difficulty, weakness and numbness.  Psychiatric/Behavioral: Positive for dysphoric mood.       Objective:   Physical Exam  Constitutional: She is oriented to person, place, and time. She appears well-developed and  well-nourished.  HENT:  Head: Normocephalic and atraumatic.  Eyes: Conjunctivae and EOM are normal. Pupils are equal, round, and reactive to light.  1 beat of horizontal nystagmus laying supine  Neck: Carotid bruit is not present.  Cardiovascular: Normal rate, regular rhythm, normal heart sounds and intact distal pulses.  Exam reveals no gallop and no friction rub.   No murmur heard. Pulmonary/Chest: Effort normal and breath sounds normal. No respiratory distress.  Abdominal: Soft. She exhibits no pulsatile midline mass. There is no tenderness. There is negative Murphy's sign.  Musculoskeletal:  Calves non tender, no lower extremity edema   Neurological: She is alert and oriented to person, place, and time.  No focal weakness, no facial droop, no pronator drift, negative romberg, normal heel to toe, normal finger to nose  Skin: Skin is warm and  dry.  Psychiatric: She has a normal mood and affect. Her behavior is normal.  Vitals reviewed.   Vitals:   09/16/16 1038  BP: 120/78  Pulse: 87  Resp: 17  Temp: 97.8 F (36.6 C)  TempSrc: Oral  SpO2: 97%  Weight: 174 lb (78.9 kg)  Height: 5' (1.524 m)   Results for orders placed or performed in visit on 09/16/16  POCT CBC  Result Value Ref Range   WBC 4.0 (A) 4.6 - 10.2 K/uL   Lymph, poc 1.3 0.6 - 3.4   POC LYMPH PERCENT 32.4 10 - 50 %L   MID (cbc) 0.4 0 - 0.9   POC MID % 10.1 0 - 12 %M   POC Granulocyte 2.3 2 - 6.9   Granulocyte percent 57.5 37 - 80 %G   RBC 4.00 (A) 4.04 - 5.48 M/uL   Hemoglobin 13.1 12.2 - 16.2 g/dL   HCT, POC 36.5 (A) 37.7 - 47.9 %   MCV 91.2 80 - 97 fL   MCH, POC 32.6 (A) 27 - 31.2 pg   MCHC 35.8 (A) 31.8 - 35.4 g/dL   RDW, POC 14.6 %   Platelet Count, POC 239 142 - 424 K/uL   MPV 7.6 0 - 99.8 fL  POCT glucose (manual entry)  Result Value Ref Range   POC Glucose 100 (A) 70 - 99 mg/dl  POCT urinalysis dipstick  Result Value Ref Range   Color, UA yellow yellow   Clarity, UA clear clear   Glucose, UA  negative negative   Bilirubin, UA negative negative   Ketones, POC UA negative negative   Spec Grav, UA 1.020    Blood, UA moderate (A) negative   pH, UA 7.5    Protein Ur, POC negative negative   Urobilinogen, UA 0.2    Nitrite, UA Negative Negative   Leukocytes, UA Trace (A) Negative  POCT Microscopic Urinalysis ([facility])  Result Value Ref Range   WBC,UR,HPF,POC Few (A) None WBC/hpf   RBC,UR,HPF,POC Many (A) None RBC/hpf   Bacteria Few (A) None, Too numerous to count   Mucus Absent Absent   Epithelial Cells, UR Per Microscopy None None, Too numerous to count cells/hpf   EKG: Sinus rhythm, no acute findings.     Assessment & Plan:    Deborah Levy is a 60 y.o. female Dizziness - Plan: POCT CBC, POCT glucose (manual entry), TSH, EKG 12-Lead, CT HEAD WO CONTRAST, Orthostatic vital signs  Daily headache - Plan: CT HEAD WO CONTRAST  Nausea without vomiting - Plan: COMPLETE METABOLIC PANEL WITH GFR, ondansetron (ZOFRAN ODT) 4 MG disintegrating tablet  Urinary frequency - Plan: POCT urinalysis dipstick, POCT Microscopic Urinalysis ([facility])  Hyperglycemia - Plan: POCT glucose (manual entry)  Acute cystitis with hematuria - Plan: nitrofurantoin, macrocrystal-monohydrate, (MACROBID) 100 MG capsule, Urine culture   History of known depression and fibromyalgia, now with headaches, dizziness, nausea.  migraines possible, but with new onset persistent headache and dizziness will check head CT. Started on meclizine for possible peripheral vertigo, and Zofran if needed for nausea. CMP, other lab work as above.  May need neurology evaluation, but will start with neuroimaging as above. ER/911/RTC precautions if acute worsening.  Borderline hyperglycemia with previous prediabetes. Can follow-up with PCP to discuss further, but borderline glucose in office.  Possible early UTI, check urine culture, start Macrobid, RTC precautions.  Meds ordered this encounter  Medications  . meclizine  (ANTIVERT) 25 MG tablet    Sig: Take 1 tablet (25 mg  total) by mouth 3 (three) times daily as needed for dizziness.    Dispense:  30 tablet    Refill:  0  . nitrofurantoin, macrocrystal-monohydrate, (MACROBID) 100 MG capsule    Sig: Take 1 capsule (100 mg total) by mouth 2 (two) times daily.    Dispense:  14 capsule    Refill:  0  . ondansetron (ZOFRAN ODT) 4 MG disintegrating tablet    Sig: Take 1 tablet (4 mg total) by mouth every 8 (eight) hours as needed for nausea or vomiting.    Dispense:  10 tablet    Refill:  0   Patient Instructions   With your dizziness, and frequent headaches, I did order a CAT scan to be performed the next few days. However this could also be vertigo, so can try meclizine as prescribed. Make sure you drink plenty of fluids, rest as needed, and do not skip meals.   Additionally you do have some signs of a possible early urinary tract infection, so start the antibiotic one pill twice per day.   For nausea, I did write some Zofran if needed, but this may also be related to dizziness so would start with the meclizine as above. I did check some other blood tests to look at liver causes, but suspect these will be normal.  Recheck in 3 days to determine if other testing needed, or if evaluation with neurology as needed.  Return to the clinic or go to the nearest emergency room if any of your symptoms worsen or new symptoms occur.   Dizziness Dizziness is a common problem. It is a feeling of unsteadiness or light-headedness. You may feel like you are about to faint. Dizziness can lead to injury if you stumble or fall. Anyone can become dizzy, but dizziness is more common in older adults. This condition can be caused by a number of things, including medicines, dehydration, or illness. HOME CARE INSTRUCTIONS Taking these steps may help with your condition: Eating and Drinking  Drink enough fluid to keep your urine clear or pale yellow. This helps to keep you from  becoming dehydrated. Try to drink more clear fluids, such as water.  Do not drink alcohol.  Limit your caffeine intake if directed by your health care provider.  Limit your salt intake if directed by your health care provider. Activity  Avoid making quick movements.  Rise slowly from chairs and steady yourself until you feel okay.  In the morning, first sit up on the side of the bed. When you feel okay, stand slowly while you hold onto something until you know that your balance is fine.  Move your legs often if you need to stand in one place for a long time. Tighten and relax your muscles in your legs while you are standing.  Do not drive or operate heavy machinery if you feel dizzy.  Avoid bending down if you feel dizzy. Place items in your home so that they are easy for you to reach without leaning over. Lifestyle  Do not use any tobacco products, including cigarettes, chewing tobacco, or electronic cigarettes. If you need help quitting, ask your health care provider.  Try to reduce your stress level, such as with yoga or meditation. Talk with your health care provider if you need help. General Instructions  Watch your dizziness for any changes.  Take medicines only as directed by your health care provider. Talk with your health care provider if you think that your dizziness is caused by  a medicine that you are taking.  Tell a friend or a family member that you are feeling dizzy. If he or she notices any changes in your behavior, have this person call your health care provider.  Keep all follow-up visits as directed by your health care provider. This is important. SEEK MEDICAL CARE IF:  Your dizziness does not go away.  Your dizziness or light-headedness gets worse.  You feel nauseous.  You have reduced hearing.  You have new symptoms.  You are unsteady on your feet or you feel like the room is spinning. SEEK IMMEDIATE MEDICAL CARE IF:  You vomit or have diarrhea  and are unable to eat or drink anything.  You have problems talking, walking, swallowing, or using your arms, hands, or legs.  You feel generally weak.  You are not thinking clearly or you have trouble forming sentences. It may take a friend or family member to notice this.  You have chest pain, abdominal pain, shortness of breath, or sweating.  Your vision changes.  You notice any bleeding.  You have a headache.  You have neck pain or a stiff neck.  You have a fever.   This information is not intended to replace advice given to you by your health care provider. Make sure you discuss any questions you have with your health care provider.   Document Released: 05/27/2001 Document Revised: 04/17/2015 Document Reviewed: 11/27/2014 Elsevier Interactive Patient Education 2016 [employer].   IF you received an x-ray today, you will receive an invoice from [facility]. Please contact [facility] at [phone] with questions or concerns regarding your invoice.   IF you received labwork today, you will receive an invoice from [facility]. Please contact [facility] at [phone] with questions or concerns regarding your invoice.   Our billing staff will not be able to assist you with questions regarding bills from these companies.  You will be contacted with the lab results as soon as they are available. The fastest way to get your results is to activate your My Chart account. Instructions are located on the last page of this paperwork. If you have not heard from [place] regarding the results in 2 weeks, please contact this office.        I personally performed the services described in this documentation, which was scribed in my presence. The recorded information has been reviewed and considered, and addended by me as needed.   Signed,   Merri Ray, MD [facility].  09/17/16 12:55  PM

## 2016-09-17 ENCOUNTER — Ambulatory Visit
Admission: RE | Admit: 2016-09-17 | Discharge: 2016-09-17 | Disposition: A | Discharge status: Home / Self Care | Payer: BLUE CROSS/BLUE SHIELD | Source: Ambulatory Visit | Attending: Family Medicine | Admitting: Family Medicine

## 2016-09-17 DIAGNOSIS — R51 Headache: Principal | ICD-10-CM

## 2016-09-17 DIAGNOSIS — R42 Dizziness and giddiness: Secondary | ICD-10-CM

## 2016-09-17 DIAGNOSIS — R519 Headache, unspecified: Secondary | ICD-10-CM

## 2016-09-17 LAB — COMPLETE METABOLIC PANEL WITH GFR
ALT: 32 U/L — ABNORMAL HIGH (ref 6–29)
AST: 32 U/L (ref 10–35)
Albumin: 3.9 g/dL (ref 3.6–5.1)
Alkaline Phosphatase: 82 U/L (ref 33–130)
BUN: 9 mg/dL (ref 7–25)
CO2: 28 mmol/L (ref 20–31)
Calcium: 8.9 mg/dL (ref 8.6–10.4)
Chloride: 104 mmol/L (ref 98–110)
Creat: 0.76 mg/dL (ref 0.50–0.99)
GFR, EST NON AFRICAN AMERICAN: 84 mL/min (ref >=60)
GFR, Est African American: >89 mL/min (ref >=60)
GLUCOSE: 100 mg/dL — AB (ref 65–99)
POTASSIUM: 4.2 mmol/L (ref 3.5–5.3)
SODIUM: 140 mmol/L (ref 135–146)
TOTAL PROTEIN: 6.3 g/dL (ref 6.1–8.1)
Total Bilirubin: 0.4 mg/dL (ref 0.2–1.2)

## 2016-09-18 LAB — URINE CULTURE: Organism ID, Bacteria: NO GROWTH

## 2016-09-19 ENCOUNTER — Ambulatory Visit (INDEPENDENT_AMBULATORY_CARE_PROVIDER_SITE_OTHER): Payer: BLUE CROSS/BLUE SHIELD | Admitting: Family Medicine

## 2016-09-19 VITALS — BP 102/70 | HR 82 | Temp 98.2°F | Resp 16 | Ht 60.0 in | Wt 179.4 lb

## 2016-09-19 DIAGNOSIS — H8111 Benign paroxysmal vertigo, right ear: Secondary | ICD-10-CM

## 2016-09-19 DIAGNOSIS — R519 Headache, unspecified: Secondary | ICD-10-CM

## 2016-09-19 DIAGNOSIS — R3129 Other microscopic hematuria: Secondary | ICD-10-CM

## 2016-09-19 DIAGNOSIS — R51 Headache: Secondary | ICD-10-CM

## 2016-09-19 DIAGNOSIS — R413 Other amnesia: Secondary | ICD-10-CM

## 2016-09-19 DIAGNOSIS — R930 Abnormal findings on diagnostic imaging of skull and head, not elsewhere classified: Secondary | ICD-10-CM | POA: Diagnosis not present

## 2016-09-19 DIAGNOSIS — IMO0002 Reserved for concepts with insufficient information to code with codable children: Secondary | ICD-10-CM

## 2016-09-19 DIAGNOSIS — N3941 Urge incontinence: Secondary | ICD-10-CM | POA: Diagnosis not present

## 2016-09-19 LAB — POCT URINALYSIS DIP (MANUAL ENTRY)
BILIRUBIN UA: NEGATIVE
Blood, UA: NEGATIVE
GLUCOSE UA: NEGATIVE
Ketones, POC UA: NEGATIVE
NITRITE UA: NEGATIVE
PH UA: 7
Protein Ur, POC: NEGATIVE
Spec Grav, UA: 1.02
Urobilinogen, UA: 0.2

## 2016-09-19 LAB — POC MICROSCOPIC URINALYSIS (UMFC): Mucus: ABSENT

## 2016-09-19 MED ORDER — BUTALBITAL-APAP-CAFFEINE 50-325-40 MG PO TABS: 1.0000 | ORAL_TABLET | Freq: Four times a day (QID) | ORAL | 0 refills | Status: DC | PRN

## 2016-09-19 NOTE — Patient Instructions (Addendum)
   IF you received an x-ray today, you will receive an invoice from [facility]. Please contact [facility] at [phone] with questions or concerns regarding your invoice.   IF you received labwork today, you will receive an invoice from [facility]. Please contact [facility] at [phone] with questions or concerns regarding your invoice.   Our billing staff will not be able to assist you with questions regarding bills from these companies.  You will be contacted with the lab results as soon as they are available. The fastest way to get your results is to activate your My Chart account. Instructions are located on the last page of this paperwork. If you have not heard from us regarding the results in 2 weeks, please contact this office.     Epley Maneuver Self-Care WHAT IS THE EPLEY MANEUVER? The Epley maneuver is an exercise you can do to relieve symptoms of benign paroxysmal positional vertigo (BPPV). This condition is often just referred to as vertigo. BPPV is caused by the movement of tiny crystals (canaliths) inside your inner ear. The accumulation and movement of canaliths in your inner ear causes a sudden spinning sensation (vertigo) when you move your head to certain positions. Vertigo usually lasts about 30 seconds. BPPV usually occurs in just one ear. If you get vertigo when you lie on your left side, you probably have BPPV in your left ear. Your health care provider can tell you which ear is involved.  BPPV may be caused by a head injury. Many people older than 50 get BPPV for unknown reasons. If you have been diagnosed with BPPV, your health care provider may teach you how to do this maneuver. BPPV is not life threatening (benign) and usually goes away in time.  WHEN SHOULD I PERFORM THE EPLEY MANEUVER? You can do this maneuver at home whenever you have symptoms of vertigo. You may do the Epley maneuver up to 3 times a day until your  symptoms of vertigo go away. HOW SHOULD I DO THE EPLEY MANEUVER? 1. Sit on the edge of a bed or table with your back straight. Your legs should be extended or hanging over the edge of the bed or table.  2. Turn your head halfway toward the affected ear.  3. Lie backward quickly with your head turned until you are lying flat on your back. You may want to position a pillow under your shoulders.  4. Hold this position for 30 seconds. You may experience an attack of vertigo. This is normal. Hold this position until the vertigo stops. 5. Then turn your head to the opposite direction until your unaffected ear is facing the floor.  6. Hold this position for 30 seconds. You may experience an attack of vertigo. This is normal. Hold this position until the vertigo stops. 7. Now turn your whole body to the same side as your head. Hold for another 30 seconds.  8. You can then sit back up. ARE THERE RISKS TO THIS MANEUVER? In some cases, you may have other symptoms (such as changes in your vision, weakness, or numbness). If you have these symptoms, stop doing the maneuver and call your health care provider. Even if doing these maneuvers relieves your vertigo, you may still have dizziness. Dizziness is the sensation of light-headedness but without the sensation of movement. Even though the Epley maneuver may relieve your vertigo, it is possible that your symptoms will return within 5 years. WHAT SHOULD I DO AFTER THIS   MANEUVER? After doing the Epley maneuver, you can return to your normal activities. Ask your doctor if there is anything you should do at home to prevent vertigo. This may include:  Sleeping with two or more pillows to keep your head elevated.  Not sleeping on the side of your affected ear.  Getting up slowly from bed.  Avoiding sudden movements during the day.  Avoiding extreme head movement, like looking up or bending over.  Wearing a cervical collar to prevent sudden head  movements. WHAT SHOULD I DO IF MY SYMPTOMS GET WORSE? Call your health care provider if your vertigo gets worse. Call your provider right way if you have other symptoms, including:   Nausea.  Vomiting.  Headache.  Weakness.  Numbness.  Vision changes.   This information is not intended to replace advice given to you by your health care provider. Make sure you discuss any questions you have with your health care provider.   Document Released: 12/06/2013 Document Reviewed: 12/06/2013 Elsevier Interactive Patient Education 2016 [employer]vier Inc[employer]  

## 2016-09-20 NOTE — Progress Notes (Addendum)
Subjective:  By signing my name below, I, Raven Small, attest that this documentation has been prepared under the direction and in the presence of Philis Fendt, PA-C.  Electronically Signed: Thea Alken, ED Scribe. 09/20/2016. 11:14 PM.   Patient ID: Deborah Levy, female    DOB: 1956-02-20, 60 y.o.   MRN: [id number]  HPI Chief Complaint  Patient presents with  . Follow-up    dizziness and headache  . Results    CT scan results     HPI Comments: Deborah Levy is a 60 y.o. female who presents to the [facility] for a follow up.   She has a headache but the dizziness is improving in the front and top of her head.  The nausea has resolved.  No vertigo but feels like her head is full of jelly and when she moves it flairs but settles quickly upon movement. Worsens with bending over and when she is walking.  Urinary frequency is still noted for months - not improved with the nitrofurantion Is having urge incontinence. Does have 1 child, daughter, delivered vaginally.  Still has a headache in the center of her head and her forehead. Has had headaches for many years which a chiropractor helped resolved. Her current headache does not seem releated to the dizziness.  She does not know what makes it better or worse.  Has not felt ok to take any otc meds. HAs are not constant.  She has taken a few tramadols for a severe HA which helps a little.   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  She messed her right ear up 4-5 mos prior with a q-tip.  She is a heavy Therapist, music.  She started the hydrocodone ER around the same time the HAs began. When she had cancer and did radiation, then she was on a lupus med that cuases eye trouble so was following regularly with optho.  She has poor vision. Has been off the lupus med for years. She did have a spot on the back of her eye they are watching but don't know why she can't see well but her vision tests are  normal.  Has not seen a neurologist in over 15 yrs. She has had memory problems for most of her life but has gotten much worse rather quickly and doesn't remember things tha tshe should, having a lot of toruble with word recall.   HGer mother also has a poor memory.  She notices she has poor immediate memory.    No h/o kidney stones. No sig h/o hematuria.  Did have a pelvic exam sev mos ago. Has been o nthe lyrica and effexor for years.  Patient Active Problem List   Diagnosis Date Noted  . Type 2 diabetes mellitus ([place]) 11/22/2014  . Chronic pain 11/13/2014  . Fibromyalgia 11/13/2014  . Rotator cuff impingement syndrome 11/13/2014  . Cannot sleep 02/13/2014  . Essential (primary) hypertension 09/07/2013  . HLD (hyperlipidemia) 09/07/2013  . Adiposity 09/07/2013  . Systemic lupus erythematosus ([place]) 05/21/2013  . Hypokalemia 04/21/2013  . Breast cancer of lower-inner quadrant of left female breast ([place]) 01/12/2012   Past Medical History:  Diagnosis Date  . Cancer ([place])   . Fibromyalgia   . GERD (gastroesophageal reflux disease)   . Hypertension   . Lupus    Past Surgical History:  Procedure Laterality Date  . BREAST SURGERY  03/10/2011   Lt br lumpectomy  .  KNEE SURGERY    . SHOULDER SURGERY    . TUBAL LIGATION     Allergies  Allergen Reactions  . Sulfa Antibiotics Nausea Only  . Metformin Nausea Only  . Sulfasalazine Nausea Only   Prior to Admission medications   Medication Sig Start Date End Date Taking? Authorizing Provider  aspirin 81 MG tablet Take 81 mg by mouth daily.   Yes Historical Provider, MD  Biotin 5000 MCG CAPS Take 2 capsules by mouth daily.    Yes Historical Provider, MD  Calcium Carbonate-Vitamin D (CALCIUM 600 + D PO) Take 1 tablet by mouth 2 (two) times daily. Reported on 12/26/2015   Yes Historical Provider, MD  lisinopril (PRINIVIL,ZESTRIL) 10 MG tablet Take 10 mg by mouth daily.     Yes Historical Provider, MD  magnesium oxide (MAG-OX) 400 MG  tablet Take 250 mg by mouth daily.    Yes Historical Provider, MD  meclizine (ANTIVERT) 25 MG tablet Take 1 tablet (25 mg total) by mouth 3 (three) times daily as needed for dizziness. 09/16/16  Yes Wendie Agreste, MD  nitrofurantoin, macrocrystal-monohydrate, (MACROBID) 100 MG capsule Take 1 capsule (100 mg total) by mouth 2 (two) times daily. 09/16/16  Yes Wendie Agreste, MD  omeprazole (PRILOSEC) 40 MG capsule  01/24/12  Yes Historical Provider, MD  ondansetron (ZOFRAN ODT) 4 MG disintegrating tablet Take 1 tablet (4 mg total) by mouth every 8 (eight) hours as needed for nausea or vomiting. 09/16/16  Yes Wendie Agreste, MD  pregabalin (LYRICA) 100 MG capsule Take 300 mg by mouth daily.    Yes Historical Provider, MD  simvastatin (ZOCOR) 40 MG tablet Take 40 mg by mouth at bedtime.  12/28/11  Yes Historical Provider, MD  traMADol (ULTRAM) 50 MG tablet Take 2 tablets (100 mg total) by mouth 2 (two) times daily. 07/04/15  Yes Chauncey Cruel, MD  venlafaxine XR (EFFEXOR-XR) 75 MG 24 hr capsule Take 1 capsule (75 mg total) by mouth daily. 11/09/15  Yes Chauncey Cruel, MD  zolpidem (AMBIEN) 10 MG tablet Take 10 mg by mouth at bedtime.     Yes Historical Provider, MD   Social History   Social History  . Marital status: Married    Spouse name: N/A  . Number of children: N/A  . Years of education: N/A   Occupational History  . Not on file.   Social History Main Topics  . Smoking status: Former Research scientist (life sciences)  . Smokeless tobacco: Former Systems developer    Quit date: 02/22/1997  . Alcohol use No  . Drug use: No  . Sexual activity: Yes    Birth control/ protection: Post-menopausal   Other Topics Concern  . Not on file   Social History Narrative  . No narrative on file   Depression screen [facility] 2/9 09/19/2016 09/16/2016 12/26/2015  Decreased Interest 0 3 0  Down, Depressed, Hopeless 0 2 0  PHQ - 2 Score 0 5 0  Altered sleeping - 3 -  Tired, decreased energy - 3 -  Change in appetite - 3 -  Feeling bad  or failure about yourself  - 3 -  Trouble concentrating - 3 -  Moving slowly or fidgety/restless - 0 -  Suicidal thoughts - 0 -  PHQ-9 Score - 20 -    Review of Systems  Constitutional: Positive for activity change and fatigue. Negative for appetite change, chills and fever.  HENT: Positive for hearing loss. Negative for congestion, ear discharge, ear pain, postnasal drip,  rhinorrhea and sinus pressure.   Eyes: Positive for visual disturbance.  Cardiovascular: Positive for leg swelling. Negative for chest pain and palpitations.  Gastrointestinal: Negative for nausea and vomiting.  Genitourinary: Positive for frequency and urgency. Negative for decreased urine volume and dysuria.  Musculoskeletal: Positive for arthralgias.  Neurological: Positive for dizziness, light-headedness and headaches.  Psychiatric/Behavioral: Positive for confusion (memory loss). Negative for dysphoric mood. The patient is not nervous/anxious.     Objective:   Physical Exam  Constitutional: She is oriented to person, place, and time. She appears well-developed and well-nourished. No distress.  HENT:  Head: Normocephalic and atraumatic.  Right Ear: Tympanic membrane, external ear and ear canal normal.  Left Ear: Tympanic membrane, external ear and ear canal normal.  Nose: Nose normal. No mucosal edema or rhinorrhea.  Mouth/Throat: Uvula is midline, oropharynx is clear and moist and mucous membranes are normal.  Eyes: Conjunctivae and EOM are normal. No scleral icterus.  Neck: Normal range of motion. Neck supple. No thyromegaly present.  Cardiovascular: Normal rate, regular rhythm, normal heart sounds and intact distal pulses.   Pulmonary/Chest: Effort normal and breath sounds normal. No respiratory distress.  Musculoskeletal: Normal range of motion. She exhibits no edema.  Lymphadenopathy:    She has no cervical adenopathy.  Neurological: She is alert and oriented to person, place, and time. Gait normal.  +  Epley Maneuver on right.   Skin: Skin is warm and dry. She is not diaphoretic. No erythema.  Psychiatric: She has a normal mood and affect. Her speech is normal and behavior is normal. Thought content normal.  Nursing note and vitals reviewed.   Vitals:   09/19/16 1756  BP: 102/70  Pulse: 82  Resp: 16  Temp: 98.2 F (36.8 C)  TempSrc: Oral  SpO2: 98%  Weight: 179 lb 6.4 oz (81.4 kg)  Height: 5' (1.524 m)   Results for orders placed or performed in visit on 09/19/16  POCT urinalysis dipstick  Result Value Ref Range   Color, UA yellow yellow   Clarity, UA clear clear   Glucose, UA negative negative   Bilirubin, UA negative negative   Ketones, POC UA negative negative   Spec Grav, UA 1.020    Blood, UA negative negative   pH, UA 7.0    Protein Ur, POC negative negative   Urobilinogen, UA 0.2    Nitrite, UA Negative Negative   Leukocytes, UA Trace (A) Negative  POCT Microscopic Urinalysis ([facility])  Result Value Ref Range   WBC,UR,HPF,POC None None WBC/hpf   RBC,UR,HPF,POC None None RBC/hpf   Bacteria None None, Too numerous to count   Mucus Absent Absent   Epithelial Cells, UR Per Microscopy None None, Too numerous to count cells/hpf      Assessment & Plan:   1. Microscopic hematuria   2. Positional vertigo of right ear   3. Frontal headache   4. Memory loss   5. Abnormal head CT   6. Urge urinary incontinence    Pt's med list in Epic is NOT correct as she is seen in [facility] and [facility] sxs are well and pt unable to remember exactly what she is taking.   Could be rebound HA from chronic narcotics?  Poss lupus hx contributing? Try Fioricet.  Reviewed head CT scan with pt - only small vessel ischemic change but the fact that it seems to be mainly localized in the frontal and parietal lobes and pt is reporting new onset rapid memory loss in addition to dizziness  of unknown etiology is atypical.  Refer to neuro for further eval. Try trx for BPPV in the interim - handout  for dix-hallpike maneuvers given and demonstrated in office.  UClx was neg and ua nml today so ok to stop abx  Orders Placed This Encounter  Procedures  . Ambulatory referral to Neurology    Referral Priority:   Routine    Referral Type:   Consultation    Referral Reason:   Specialty Services Required    Requested Specialty:   Neurology    Number of Visits Requested:   1  . POCT urinalysis dipstick  . POCT Microscopic Urinalysis ([facility])    Meds ordered this encounter  Medications  . butalbital-acetaminophen-caffeine (FIORICET, ESGIC) 50-325-40 MG tablet    Sig: Take 1-2 tablets by mouth every 6 (six) hours as needed for headache.    Dispense:  40 tablet    Refill:  0   Over 40 min spent in face-to-face evaluation of and consultation with patient and coordination of care.  Over 50% of this time was spent counseling this patient.  Delman Cheadle, M.D.  [facility] [address] [place], [place] [address] [phone] phone [phone] fax  09/20/16 11:14 PM

## 2016-09-24 ENCOUNTER — Telehealth: Payer: Self-pay | Admitting: *Deleted

## 2016-09-24 NOTE — Telephone Encounter (Signed)
Patient wants to know her results from 09/19/16 for urine.   [id number] Or work [phone]

## 2016-09-24 NOTE — Telephone Encounter (Signed)
In office, UA and microscopy was normal on 10/6. I did not see a reason to send a clx so there are no results.   Also at that visit, I discussed with pt that her UClx from 10/3 was negative so she could stop the antibiotic on 10/6.

## 2016-09-26 NOTE — Telephone Encounter (Signed)
No answer no voicemail and lmom at work.

## 2016-09-26 NOTE — Telephone Encounter (Signed)
Patient returned called and results given.

## 2016-10-18 DIAGNOSIS — Z79899 Other long term (current) drug therapy: Secondary | ICD-10-CM | POA: Insufficient documentation

## 2016-10-18 DIAGNOSIS — H35389 Toxic maculopathy, unspecified eye: Secondary | ICD-10-CM | POA: Insufficient documentation

## 2016-10-18 DIAGNOSIS — T372X5A Adverse effect of antimalarials and drugs acting on other blood protozoa, initial encounter: Secondary | ICD-10-CM

## 2016-10-19 NOTE — Progress Notes (Signed)
*IMAGE* Office Visit Note  Patient: Deborah Levy             Date of Birth: 30-Dec-1955           MRN: [id number]             PCP: Garth Schlatter Referring: Serita Grit, PA* Visit Date: 10/21/2016 Occupation:@GUAROCC @    Subjective:  No chief complaint on file. Follow-up on systemic lupus erythematosus high risk prescription fibromyalgia.  History of Present Illness: Deborah Levy is a 60 y.o. female last seen in our office on 05/06/2016. On the last visit her systemic lupus was doing well. No synovitis on examination. She has been using methotrexate but the methotrexate makes her feel bad. She takes her 7 pills on Friday and all weekend long she has severe fatigue. She is not going out or doing anything on the weekend due to the severe fatigue.  Her subcutaneous lupus is also doing well. She does not have any joint pain swelling or stiffness at today's visit but she does complain of swelling in the morning of her fingers.  She has a history of fibromyalgia as well which may be contributing to her fatigue.  She may also have vitamin D deficiency which we can check.     Activities of Daily Living:  Patient reports morning stiffness for 15 minutes.   Patient Reports nocturnal pain.  Difficulty dressing/grooming: Denies Difficulty climbing stairs: Denies Difficulty getting out of chair: Denies Difficulty using hands for taps, buttons, cutlery, and/or writing: Denies   Review of Systems  Constitutional: Positive for fatigue.  HENT: Negative for mouth sores and mouth dryness.   Eyes: Negative for dryness.  Respiratory: Negative for shortness of breath.   Gastrointestinal: Negative for constipation and diarrhea.  Musculoskeletal: Positive for myalgias and myalgias.  Skin: Negative for sensitivity to sunlight.  Psychiatric/Behavioral: Positive for sleep disturbance. Negative for decreased concentration.    PMFS History:  Patient Active Problem List   Diagnosis Date Noted  . High risk medication use 10/18/2016  . 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 Type 2 diabetes mellitus ([place]) 11/22/2014  . Chronic pain 11/13/2014  . Fibromyalgia 11/13/2014  . Rotator cuff impingement syndrome 11/13/2014  . Cannot sleep 02/13/2014  . Essential (primary) hypertension 09/07/2013  . HLD (hyperlipidemia) 09/07/2013  . Adiposity 09/07/2013  . Systemic lupus erythematosus ([place]) 05/21/2013  . Hypokalemia 04/21/2013  . Breast cancer of lower-inner quadrant of left female breast ([place]) 01/12/2012    Past Medical History:  Diagnosis Date  . Cancer ([place])   . Fibromyalgia   . GERD (gastroesophageal reflux disease)   . Hypertension   . Lupus     Family History  Problem Relation Age of Onset  . Cancer Father     colon  . Hypertension Father   . Heart disease Father   . Cancer Sister     Cervical  . Heart disease Maternal Grandfather   . Hypertension Paternal Grandmother   . Heart disease Paternal Grandmother    Past Surgical History:  Procedure Laterality Date  . BREAST SURGERY  03/10/2011   Lt br lumpectomy  . COLONOSCOPY     Removed 2 polpys   . KNEE SURGERY    . SHOULDER SURGERY    . TUBAL LIGATION     Social History   Social History Narrative  . No narrative on file     Objective: Vital Signs: BP (!) 142/73 (BP Location: Left Arm,  Patient Position: Sitting, Cuff Size: Large)   Pulse 79   Resp 14   Ht 5' (1.524 m)   Wt 181 lb (82.1 kg)   BMI 35.35 kg/m    Physical Exam  Constitutional: She is oriented to person, place, and time. She appears well-developed and well-nourished.  HENT:  Head: Normocephalic and atraumatic.  Eyes: EOM are normal. Pupils are equal, round, and reactive to light.  Cardiovascular: Normal rate, regular rhythm and normal heart sounds.  Exam reveals no gallop and no friction rub.   No murmur heard. Pulmonary/Chest: Effort normal and breath sounds normal. She has no wheezes.  She has no rales.  Abdominal: Soft. Bowel sounds are normal. She exhibits no distension. There is no tenderness. There is no guarding. No hernia.  Musculoskeletal: Normal range of motion. She exhibits no edema, tenderness or deformity.  Lymphadenopathy:    She has no cervical adenopathy.  Neurological: She is alert and oriented to person, place, and time. Coordination normal.  Skin: Skin is warm and dry. Capillary refill takes less than 2 seconds. No rash noted.  Psychiatric: She has a normal mood and affect. Her behavior is normal.     Musculoskeletal Exam:  Full range of motion of all joints Grip strength is equal and strong bilaterally Fibromyalgia tender points are 18 out of 18 positive.  CDAI Exam: CDAI Homunculus Exam:   Joint Counts:  CDAI Tender Joint count: 0 CDAI Swollen Joint count: 0  Global Assessments:  Patient Global Assessment: 8 Provider Global Assessment: 8  CDAI Calculated Score: 16   No synovitis on exam.  Investigation: No additional findings.   Imaging: No results found.  Speciality Comments: No specialty comments available.    Procedures:  No procedures performed Allergies: Sulfa antibiotics; Metformin; and Sulfasalazine   Assessment / Plan: Visit Diagnoses: Other forms of systemic lupus erythematosus, unspecified organ involvement status ([place])  High risk medication use - Plan: CBC with Differential/Platelet, CBC with Differential/Platelet, COMPLETE METABOLIC PANEL WITH GFR, COMPLETE METABOLIC PANEL WITH GFR, VITAMIN D 25 Hydroxy (Vit-D Deficiency, Fractures)  Fibromyalgia  Fatigue, unspecified type - Plan: VITAMIN D 25 Hydroxy (Vit-D Deficiency, Fractures)  Insomnia, unspecified type  Malignant neoplasm of female breast, unspecified estrogen receptor status, unspecified laterality, unspecified site of breast ([place])   We offered the patient injectable methotrexate and she is agreeable. She states that her husband will be happy to give  her the injections. I advised the patient at the first injection can be done in our office and we can train her husband how to do it. It is possible that we may be able to decrease the dose of methotrexate from 0.7 mL's to 0.6 in bowels if she responds well to the injectable.  She will need to continue to take folic acid 2 pills every day  We will check her vitamin D today because she has a history of vitamin D deficiency as well as fatigue in the past. With her current fatigue that may be coming from low vitamin D.  I encouraged her to water aerobics with her fibromyalgia.  Orders: Orders Placed This Encounter  Procedures  . CBC with Differential/Platelet  . CBC with Differential/Platelet  . COMPLETE METABOLIC PANEL WITH GFR  . COMPLETE METABOLIC PANEL WITH GFR  . VITAMIN D 25 Hydroxy (Vit-D Deficiency, Fractures)   Meds ordered this encounter  Medications  . folic acid (FOLVITE) 1 MG tablet    Sig: Take 2 tablets (2 mg total) by mouth daily.  Dispense:  180 tablet    Refill:  3  . folic acid (FOLVITE) 1 MG tablet    Sig: take 2 tablets by mouth once daily  . DISCONTD: methotrexate (RHEUMATREX) 2.5 MG tablet    Sig: Take 7 tablets by mouth once a week.  Marland Kitchen DISCONTD: aspirin EC 81 MG tablet    Sig: Take by mouth.  . methotrexate 50 MG/2ML injection    Sig: Inject 0.7 mLs (17.5 mg total) into the skin once a week.    Dispense:  10 mL    Refill:  0    Order Specific Question:   Supervising Provider    Answer:   Bo Merino [phone]    Face-to-face time spent with patient was 40 minutes. 50% of time was spent in counseling and coordination of care.  Follow-Up Instructions: Return in about 5 months (around 03/21/2017).     I examined and evaluated the patient with Eliezer Lofts PA. The plan of care was discussed as noted above.  Bo Merino, MD

## 2016-10-21 ENCOUNTER — Ambulatory Visit (INDEPENDENT_AMBULATORY_CARE_PROVIDER_SITE_OTHER): Payer: BLUE CROSS/BLUE SHIELD | Admitting: Rheumatology

## 2016-10-21 ENCOUNTER — Other Ambulatory Visit: Payer: Self-pay | Admitting: Rheumatology

## 2016-10-21 ENCOUNTER — Encounter: Payer: Self-pay | Admitting: Rheumatology

## 2016-10-21 VITALS — BP 142/73 | HR 79 | Resp 14 | Ht 60.0 in | Wt 181.0 lb

## 2016-10-21 DIAGNOSIS — C50919 Malignant neoplasm of unspecified site of unspecified female breast: Secondary | ICD-10-CM

## 2016-10-21 DIAGNOSIS — Z79899 Other long term (current) drug therapy: Secondary | ICD-10-CM | POA: Diagnosis not present

## 2016-10-21 DIAGNOSIS — M328 Other forms of systemic lupus erythematosus: Secondary | ICD-10-CM | POA: Diagnosis not present

## 2016-10-21 DIAGNOSIS — R5383 Other fatigue: Secondary | ICD-10-CM | POA: Diagnosis not present

## 2016-10-21 DIAGNOSIS — M797 Fibromyalgia: Secondary | ICD-10-CM | POA: Diagnosis not present

## 2016-10-21 DIAGNOSIS — G4700 Insomnia, unspecified: Secondary | ICD-10-CM | POA: Diagnosis not present

## 2016-10-21 MED ORDER — METHOTREXATE SODIUM CHEMO INJECTION 50 MG/2ML: 17.5000 mg | INTRAMUSCULAR | 0 refills | Status: DC

## 2016-10-21 MED ORDER — ONDANSETRON HCL 4 MG PO TABS: ORAL_TABLET | ORAL | 2 refills | Status: DC

## 2016-10-21 MED ORDER — FOLIC ACID 1 MG PO TABS: 2.0000 mg | ORAL_TABLET | Freq: Every day | ORAL | 3 refills | Status: DC

## 2016-10-21 NOTE — Telephone Encounter (Signed)
Patient needs a refill of omeprozale sent to [facility] on [address]

## 2016-10-22 LAB — CBC WITH DIFFERENTIAL/PLATELET
BASOS PCT: 0 %
Basophils Absolute: 0 cells/uL (ref 0–200)
Eosinophils Absolute: 90 cells/uL (ref 15–500)
Eosinophils Relative: 2 %
HEMATOCRIT: 38.5 % (ref 35.0–45.0)
Hemoglobin: 12.9 g/dL (ref 11.7–15.5)
LYMPHS PCT: 39 %
Lymphs Abs: 1755 cells/uL (ref 850–3900)
MCH: 30.9 pg (ref 27.0–33.0)
MCHC: 33.5 g/dL (ref 32.0–36.0)
MCV: 92.1 fL (ref 80.0–100.0)
MONO ABS: 675 {cells}/uL (ref 200–950)
MPV: 10.3 fL (ref 7.5–12.5)
Monocytes Relative: 15 %
NEUTROS ABS: 1980 {cells}/uL (ref 1500–7800)
Neutrophils Relative %: 44 %
PLATELETS: 273 10*3/uL (ref 140–400)
RBC: 4.18 MIL/uL (ref 3.80–5.10)
RDW: 14.5 % (ref 11.0–15.0)
WBC: 4.5 10*3/uL (ref 3.8–10.8)

## 2016-10-22 LAB — COMPLETE METABOLIC PANEL WITH GFR
ALT: 45 U/L — AB (ref 6–29)
AST: 41 U/L — AB (ref 10–35)
Albumin: 4.1 g/dL (ref 3.6–5.1)
Alkaline Phosphatase: 77 U/L (ref 33–130)
BUN: 14 mg/dL (ref 7–25)
CALCIUM: 9 mg/dL (ref 8.6–10.4)
CHLORIDE: 105 mmol/L (ref 98–110)
CO2: 28 mmol/L (ref 20–31)
CREATININE: 0.77 mg/dL (ref 0.50–0.99)
GFR, Est African American: >89 mL/min (ref >=60)
GFR, Est Non African American: 83 mL/min (ref >=60)
Glucose, Bld: 74 mg/dL (ref 65–99)
Potassium: 4.7 mmol/L (ref 3.5–5.3)
Sodium: 141 mmol/L (ref 135–146)
TOTAL PROTEIN: 6.3 g/dL (ref 6.1–8.1)
Total Bilirubin: 0.4 mg/dL (ref 0.2–1.2)

## 2016-10-22 LAB — VITAMIN D 25 HYDROXY (VIT D DEFICIENCY, FRACTURES): Vit D, 25-Hydroxy: 33 ng/mL (ref 30–100)

## 2016-10-22 MED ORDER — "TUBERCULIN-ALLERGY SYRINGES 27G X 1/2"" 1 ML KIT": 1.0000 | PACK | 4 refills | Status: DC

## 2016-10-22 NOTE — Progress Notes (Signed)
Patient is switched from oral methotrexate injectable methotrexate. We will monitor these labs again in approximately 4 weeks. No change in treatment at this time.I forgot to give her tuberculin syringes last visit in 5 called in for the patient. If he will inform the patient that would be great.Patient's vitamin D is normal at 33 but she would benefit from taking over-the-counterVitamin D3 5000 units every weekend (Saturday and Sunday only).

## 2016-10-22 NOTE — Telephone Encounter (Signed)
I do not see where we have prescribed this for her. I called her with her MTX it may be safer for her to use Zantac as PPI's can lead to MTX toxicity. Discussed with her  And she voiced understanding

## 2016-10-22 NOTE — Addendum Note (Signed)
Addended byEliezer Lofts on: 10/22/2016 11:11 AM   Modules accepted: Orders

## 2016-10-22 NOTE — Progress Notes (Signed)
I did review LFT. AST ALT are mildly elevated in their 40s. We can check this again in 4 weeks.

## 2016-10-23 ENCOUNTER — Telehealth: Payer: Self-pay | Admitting: Radiology

## 2016-10-23 MED ORDER — "TUBERCULIN-ALLERGY SYRINGES 27G X 1/2"" 1 ML KIT": 1.0000 | PACK | 4 refills | Status: DC

## 2016-10-23 NOTE — Telephone Encounter (Signed)
I called pt and left voice mail explaining everything.

## 2016-10-23 NOTE — Telephone Encounter (Signed)
Patient confused about syringe conversation, I have tried to help, but have confused her further. She did not know what you were referring to.

## 2016-10-23 NOTE — Telephone Encounter (Signed)
-----   Message from Eliezer Lofts, [place] sent at 10/22/2016 11:14 AM EST ----- Patient is switched from oral methotrexate injectable methotrexate. We will monitor these labs again in approximately 4 weeks. No change in treatment at this time.I forgot to give her tuberculin syringes last visit in 5 called in for the patient. I f he will inform the patient that would be great.Patient's vitamin D is normal at 33 but she would benefit from taking over-the-counterVitamin D3 5000 units every weekend (Saturday and Sunday only).

## 2016-11-25 ENCOUNTER — Encounter: Payer: Self-pay | Admitting: Neurology

## 2016-11-25 ENCOUNTER — Ambulatory Visit (INDEPENDENT_AMBULATORY_CARE_PROVIDER_SITE_OTHER): Payer: BLUE CROSS/BLUE SHIELD | Admitting: Neurology

## 2016-11-25 VITALS — BP 126/72 | HR 87 | Ht 60.0 in | Wt 180.4 lb

## 2016-11-25 DIAGNOSIS — Z853 Personal history of malignant neoplasm of breast: Secondary | ICD-10-CM | POA: Diagnosis not present

## 2016-11-25 DIAGNOSIS — M329 Systemic lupus erythematosus, unspecified: Secondary | ICD-10-CM | POA: Diagnosis not present

## 2016-11-25 DIAGNOSIS — R413 Other amnesia: Secondary | ICD-10-CM

## 2016-11-25 NOTE — Patient Instructions (Signed)
1. Schedule MRI brain with and without contrast 2. Consider seeing a psychiatrist and therapist, this would be very helpful with improving memory problems. Call us when ready 3. Control of blood pressure, cholesterol, as well as physical exercise and brain stimulation exercises are important for brain health. 4. Follow-up in 6 months
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
## 2016-12-01 ENCOUNTER — Other Ambulatory Visit: Payer: Self-pay | Admitting: Oncology

## 2016-12-01 DIAGNOSIS — C50912 Malignant neoplasm of unspecified site of left female breast: Secondary | ICD-10-CM

## 2016-12-09 ENCOUNTER — Ambulatory Visit
Admission: RE | Admit: 2016-12-09 | Discharge: 2016-12-09 | Disposition: A | Discharge status: Home / Self Care | Payer: BLUE CROSS/BLUE SHIELD | Source: Ambulatory Visit | Attending: Neurology | Admitting: Neurology

## 2016-12-09 DIAGNOSIS — R413 Other amnesia: Secondary | ICD-10-CM

## 2016-12-09 DIAGNOSIS — M329 Systemic lupus erythematosus, unspecified: Secondary | ICD-10-CM

## 2016-12-09 DIAGNOSIS — Z853 Personal history of malignant neoplasm of breast: Secondary | ICD-10-CM

## 2016-12-09 MED ORDER — GADOBENATE DIMEGLUMINE 529 MG/ML IV SOLN
17.0000 mL | Freq: Once | INTRAVENOUS | Status: AC | PRN
  Administered 2016-12-09: 17 mL via INTRAVENOUS

## 2016-12-11 ENCOUNTER — Telehealth: Payer: Self-pay | Admitting: Neurology

## 2016-12-11 ENCOUNTER — Other Ambulatory Visit: Payer: Self-pay | Admitting: Neurology

## 2016-12-11 NOTE — Telephone Encounter (Signed)
Deborah Levy [place] 22-Sep-2056. She was calling regarding her MRI results. Her cell # is [phone] and her work # is [id number] ext. [phone]. Thank you

## 2016-12-12 NOTE — Telephone Encounter (Signed)
pls let her know I reviewed MRI brain, it is normal, no evidence of tumor, stroke, or bleed. thanks

## 2016-12-12 NOTE — Telephone Encounter (Signed)
Patient notified

## 2016-12-12 NOTE — Telephone Encounter (Signed)
Please advise 

## 2017-02-03 ENCOUNTER — Other Ambulatory Visit: Payer: Self-pay | Admitting: Rheumatology

## 2017-02-03 ENCOUNTER — Other Ambulatory Visit: Payer: Self-pay | Admitting: *Deleted

## 2017-02-03 DIAGNOSIS — Z79899 Other long term (current) drug therapy: Secondary | ICD-10-CM

## 2017-02-03 LAB — CBC WITH DIFFERENTIAL/PLATELET
Basophils Absolute: 36 cells/uL (ref 0–200)
Basophils Relative: 1 %
EOS ABS: 72 {cells}/uL (ref 15–500)
EOS PCT: 2 %
HCT: 36.7 % (ref 35.0–45.0)
HEMOGLOBIN: 12.4 g/dL (ref 11.7–15.5)
LYMPHS ABS: 1080 {cells}/uL (ref 850–3900)
Lymphocytes Relative: 30 %
MCH: 31.3 pg (ref 27.0–33.0)
MCHC: 33.8 g/dL (ref 32.0–36.0)
MCV: 92.7 fL (ref 80.0–100.0)
MPV: 10.3 fL (ref 7.5–12.5)
Monocytes Absolute: 504 cells/uL (ref 200–950)
Monocytes Relative: 14 %
NEUTROS PCT: 53 %
Neutro Abs: 1908 cells/uL (ref 1500–7800)
Platelets: 250 10*3/uL (ref 140–400)
RBC: 3.96 MIL/uL (ref 3.80–5.10)
RDW: 15 % (ref 11.0–15.0)
WBC: 3.6 10*3/uL — ABNORMAL LOW (ref 3.8–10.8)

## 2017-02-03 LAB — COMPLETE METABOLIC PANEL WITH GFR
ALBUMIN: 4 g/dL (ref 3.6–5.1)
ALK PHOS: 76 U/L (ref 33–130)
ALT: 37 U/L — ABNORMAL HIGH (ref 6–29)
AST: 35 U/L (ref 10–35)
BILIRUBIN TOTAL: 0.4 mg/dL (ref 0.2–1.2)
BUN: 13 mg/dL (ref 7–25)
CALCIUM: 9 mg/dL (ref 8.6–10.4)
CO2: 28 mmol/L (ref 20–31)
Chloride: 107 mmol/L (ref 98–110)
Creat: 0.91 mg/dL (ref 0.50–0.99)
GFR, EST AFRICAN AMERICAN: 78 mL/min (ref >=60)
GFR, EST NON AFRICAN AMERICAN: 68 mL/min (ref >=60)
Glucose, Bld: 79 mg/dL (ref 65–99)
POTASSIUM: 4.1 mmol/L (ref 3.5–5.3)
SODIUM: 142 mmol/L (ref 135–146)
Total Protein: 6.6 g/dL (ref 6.1–8.1)

## 2017-02-03 MED ORDER — METHOTREXATE SODIUM CHEMO INJECTION 50 MG/2ML
17.5000 mg | INTRAMUSCULAR | 0 refills | Status: DC
Start: 2017-02-03 — End: 2017-03-24

## 2017-02-03 NOTE — Telephone Encounter (Signed)
Patient called requesting a refill on her MTX.  Cb#[phone] until 5pm.  Thank you

## 2017-02-03 NOTE — Telephone Encounter (Signed)
Last Visit: 10/21/16 Next Visit: 03/24/17 Labs: 10/31/17 AST 41 ALT 45 Patient coming to office to update labs this week  Okay to refill MTX?

## 2017-02-09 ENCOUNTER — Telehealth: Payer: Self-pay | Admitting: Radiology

## 2017-02-09 NOTE — Telephone Encounter (Signed)
I have called patient to advise labs are normal except mild elevation of ALT decreased WBC.

## 2017-02-09 NOTE — Telephone Encounter (Signed)
-----   Message from [place], [place] sent at 02/04/2017  6:01 PM EST ----- Send labs to PCP Until patient CMP with GFR is within normal limits except mild elevation of ALT at 37  CBC with differential is within normal limits with mild increase in white count at 3.6.

## 2017-03-09 ENCOUNTER — Other Ambulatory Visit: Payer: Self-pay | Admitting: Obstetrics and Gynecology

## 2017-03-09 DIAGNOSIS — Z853 Personal history of malignant neoplasm of breast: Secondary | ICD-10-CM

## 2017-03-23 DIAGNOSIS — R5383 Other fatigue: Secondary | ICD-10-CM | POA: Insufficient documentation

## 2017-03-23 DIAGNOSIS — Z8639 Personal history of other endocrine, nutritional and metabolic disease: Secondary | ICD-10-CM | POA: Insufficient documentation

## 2017-03-23 DIAGNOSIS — F5101 Primary insomnia: Secondary | ICD-10-CM | POA: Insufficient documentation

## 2017-03-23 NOTE — Progress Notes (Signed)
Office Visit Note  Patient: Deborah Levy             Date of Birth: 13-Mar-1956           MRN: [id number]             PCP: Garth Schlatter Referring: Serita Grit, PA* Visit Date: 03/24/2017 Occupation: '@GUAROCC'$ @    Subjective:  Follow-up   History of Present Illness: Deborah Levy is a 61 y.o. female    Last seen 10/21/2016.  Patient is doing really well with her lupus. No joint pain swelling and stiffness. In fact, she states that the 0.7 mL's of methotrexate injectable is working very well for her joints. Her only complaint at this time is that she is having some hair loss. She is not having those issues when she was on pills.  She does take folic acid 2 mg every day. She also takes biotin for the hair loss.     Activities of Daily Living:  Patient reports morning stiffness for 15 minutes.   Patient Denies nocturnal pain.  Difficulty dressing/grooming: Denies Difficulty climbing stairs: Denies Difficulty getting out of chair: Denies Difficulty using hands for taps, buttons, cutlery, and/or writing: Denies   Review of Systems  Constitutional: Negative for fatigue.  HENT: Negative for mouth sores and mouth dryness.   Eyes: Negative for dryness.  Respiratory: Negative for shortness of breath.   Gastrointestinal: Negative for constipation and diarrhea.  Musculoskeletal: Negative for myalgias and myalgias.  Skin: Negative for sensitivity to sunlight.  Psychiatric/Behavioral: Negative for decreased concentration and sleep disturbance.    PMFS History:  Patient Active Problem List   Diagnosis Date Noted  . Other fatigue 03/23/2017  . Primary insomnia 03/23/2017  . History of vitamin D deficiency 03/23/2017  . High risk medication use 10/18/2016  . 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 Type 2 diabetes mellitus ([place]) 11/22/2014  . Chronic pain 11/13/2014  . Fibromyalgia 11/13/2014  . Rotator cuff impingement  syndrome 11/13/2014  . Cannot sleep 02/13/2014  . Essential (primary) hypertension 09/07/2013  . HLD (hyperlipidemia) 09/07/2013  . Adiposity 09/07/2013  . Systemic lupus erythematosus ([place]) 05/21/2013  . Hypokalemia 04/21/2013  . Breast cancer of lower-inner quadrant of left female breast ([place]) 01/12/2012    Past Medical History:  Diagnosis Date  . Cancer ([place])   . Fibromyalgia   . GERD (gastroesophageal reflux disease)   . Hypertension   . Lupus     Family History  Problem Relation Age of Onset  . Cancer Father     colon  . Hypertension Father   . Heart disease Father   . Cancer Sister     Cervical  . Heart disease Maternal Grandfather   . Hypertension Paternal Grandmother   . Heart disease Paternal Grandmother    Past Surgical History:  Procedure Laterality Date  . BREAST SURGERY  03/10/2011   Lt br lumpectomy  . COLONOSCOPY     Removed 2 polpys   . KNEE SURGERY    . SHOULDER SURGERY    . TUBAL LIGATION     Social History   Social History Narrative  . No narrative on file     Objective: Vital Signs: BP 116/74   Pulse 82   Resp 13   Ht '4\' 11"'$  (1.499 m)   Wt 179 lb (81.2 kg)   BMI 36.15 kg/m    Physical Exam  Constitutional: She is oriented to person, place, and  time. She appears well-developed and well-nourished.  HENT:  Head: Normocephalic and atraumatic.  Eyes: EOM are normal. Pupils are equal, round, and reactive to light.  Cardiovascular: Normal rate, regular rhythm and normal heart sounds.  Exam reveals no gallop and no friction rub.   No murmur heard. Pulmonary/Chest: Effort normal and breath sounds normal. She has no wheezes. She has no rales.  Abdominal: Soft. Bowel sounds are normal. She exhibits no distension. There is no tenderness. There is no guarding. No hernia.  Musculoskeletal: Normal range of motion. She exhibits no edema, tenderness or deformity.  Lymphadenopathy:    She has no cervical adenopathy.  Neurological: She is alert and  oriented to person, place, and time. Coordination normal.  Skin: Skin is warm and dry. Capillary refill takes less than 2 seconds. No rash noted.  Psychiatric: She has a normal mood and affect. Her behavior is normal.  Nursing note and vitals reviewed.    Musculoskeletal Exam:  Full range of motion of all joints Grip strength is equal and strong bilaterally Fiber myalgia tender points are all absent  CDAI Exam: CDAI Homunculus Exam:   Joint Counts:  CDAI Tender Joint count: 0 CDAI Swollen Joint count: 0  Global Assessments:  Patient Global Assessment: 2 Provider Global Assessment: 2  CDAI Calculated Score: 4 No synovitis on examination today.   Investigation: No additional findings. Orders Only on 02/03/2017  Component Date Value Ref Range Status  . WBC 02/03/2017 3.6* 3.8 - 10.8 K/uL Final  . RBC 02/03/2017 3.96  3.80 - 5.10 MIL/uL Final  . Hemoglobin 02/03/2017 12.4  11.7 - 15.5 g/dL Final  . HCT 02/03/2017 36.7  35.0 - 45.0 % Final  . MCV 02/03/2017 92.7  80.0 - 100.0 fL Final  . MCH 02/03/2017 31.3  27.0 - 33.0 pg Final  . MCHC 02/03/2017 33.8  32.0 - 36.0 g/dL Final  . RDW 02/03/2017 15.0  11.0 - 15.0 % Final  . Platelets 02/03/2017 250  140 - 400 K/uL Final  . MPV 02/03/2017 10.3  7.5 - 12.5 fL Final  . Neutro Abs 02/03/2017 1908  1,500 - 7,800 cells/uL Final  . Lymphs Abs 02/03/2017 1080  850 - 3,900 cells/uL Final  . Monocytes Absolute 02/03/2017 504  200 - 950 cells/uL Final  . Eosinophils Absolute 02/03/2017 72  15 - 500 cells/uL Final  . Basophils Absolute 02/03/2017 36  0 - 200 cells/uL Final  . Neutrophils Relative % 02/03/2017 53  % Final  . Lymphocytes Relative 02/03/2017 30  % Final  . Monocytes Relative 02/03/2017 14  % Final  . Eosinophils Relative 02/03/2017 2  % Final  . Basophils Relative 02/03/2017 1  % Final  . Smear Review 02/03/2017 Criteria for review not met   Final  . Sodium 02/03/2017 142  135 - 146 mmol/L Final  . Potassium 02/03/2017  4.1  3.5 - 5.3 mmol/L Final  . Chloride 02/03/2017 107  98 - 110 mmol/L Final  . CO2 02/03/2017 28  20 - 31 mmol/L Final  . Glucose, Bld 02/03/2017 79  65 - 99 mg/dL Final  . BUN 02/03/2017 13  7 - 25 mg/dL Final  . Creat 02/03/2017 0.91  0.50 - 0.99 mg/dL Final   Comment:   For patients > or = 63 years of age: The upper reference limit for Creatinine is approximately 13% higher for people identified as African-American.     . Total Bilirubin 02/03/2017 0.4  0.2 - 1.2 mg/dL Final  .  Alkaline Phosphatase 02/03/2017 76  33 - 130 U/L Final  . AST 02/03/2017 35  10 - 35 U/L Final  . ALT 02/03/2017 37* 6 - 29 U/L Final  . Total Protein 02/03/2017 6.6  6.1 - 8.1 g/dL Final  . Albumin 02/03/2017 4.0  3.6 - 5.1 g/dL Final  . Calcium 02/03/2017 9.0  8.6 - 10.4 mg/dL Final  . GFR, Est African American 02/03/2017 78  >=60 mL/min Final  . GFR, Est Non African American 02/03/2017 68  >=60 mL/min Final  Office Visit on 10/21/2016  Component Date Value Ref Range Status  . WBC 10/21/2016 4.5  3.8 - 10.8 K/uL Final  . RBC 10/21/2016 4.18  3.80 - 5.10 MIL/uL Final  . Hemoglobin 10/21/2016 12.9  11.7 - 15.5 g/dL Final  . HCT 10/21/2016 38.5  35.0 - 45.0 % Final  . MCV 10/21/2016 92.1  80.0 - 100.0 fL Final  . MCH 10/21/2016 30.9  27.0 - 33.0 pg Final  . MCHC 10/21/2016 33.5  32.0 - 36.0 g/dL Final  . RDW 10/21/2016 14.5  11.0 - 15.0 % Final  . Platelets 10/21/2016 273  140 - 400 K/uL Final  . MPV 10/21/2016 10.3  7.5 - 12.5 fL Final  . Neutro Abs 10/21/2016 1980  1,500 - 7,800 cells/uL Final  . Lymphs Abs 10/21/2016 1755  850 - 3,900 cells/uL Final  . Monocytes Absolute 10/21/2016 675  200 - 950 cells/uL Final  . Eosinophils Absolute 10/21/2016 90  15 - 500 cells/uL Final  . Basophils Absolute 10/21/2016 0  0 - 200 cells/uL Final  . Neutrophils Relative % 10/21/2016 44  % Final  . Lymphocytes Relative 10/21/2016 39  % Final  . Monocytes Relative 10/21/2016 15  % Final  . Eosinophils Relative  10/21/2016 2  % Final  . Basophils Relative 10/21/2016 0  % Final  . Smear Review 10/21/2016 Criteria for review not met   Final  . Sodium 10/21/2016 141  135 - 146 mmol/L Final  . Potassium 10/21/2016 4.7  3.5 - 5.3 mmol/L Final  . Chloride 10/21/2016 105  98 - 110 mmol/L Final  . CO2 10/21/2016 28  20 - 31 mmol/L Final  . Glucose, Bld 10/21/2016 74  65 - 99 mg/dL Final  . BUN 10/21/2016 14  7 - 25 mg/dL Final  . Creat 10/21/2016 0.77  0.50 - 0.99 mg/dL Final   Comment:   For patients > or = 63 years of age: The upper reference limit for Creatinine is approximately 13% higher for people identified as African-American.     . Total Bilirubin 10/21/2016 0.4  0.2 - 1.2 mg/dL Final  . Alkaline Phosphatase 10/21/2016 77  33 - 130 U/L Final  . AST 10/21/2016 41* 10 - 35 U/L Final  . ALT 10/21/2016 45* 6 - 29 U/L Final  . Total Protein 10/21/2016 6.3  6.1 - 8.1 g/dL Final  . Albumin 10/21/2016 4.1  3.6 - 5.1 g/dL Final  . Calcium 10/21/2016 9.0  8.6 - 10.4 mg/dL Final  . GFR, Est African American 10/21/2016 >89  >=60 mL/min Final  . GFR, Est Non African American 10/21/2016 83  >=60 mL/min Final  . Vit D, 25-Hydroxy 10/21/2016 33  30 - 100 ng/mL Final   Comment: Vitamin D Status           25-OH Vitamin D        Deficiency                <  20 ng/mL        Insufficiency         20 - 29 ng/mL        Optimal             > or = 30 ng/mL   For 25-OH Vitamin D testing on patients on D2-supplementation and patients for whom quantitation of D2 and D3 fractions is required, the [facility] 25-OH VIT D, (D2,D3), LC/MS/MS is recommended: order code [id number] (patients > 2 yrs).      Imaging: No results found.  Speciality Comments: No specialty comments available.    Procedures:  No procedures performed Allergies: Sulfa antibiotics; Metformin; and Sulfasalazine   Assessment / Plan:     Visit Diagnoses: Other forms of systemic lupus erythematosus, unspecified organ involvement status ([place]) -  +ANA; +Ro, +La // Fatigue, +MR, arthralgia ;;  High risk medication use - MTX-50 MG/2ML injection, Inject 0.7 mLs (17.5 mg total) into the skin once a week - Plan: CBC with Differential/Platelet, COMPLETE METABOLIC PANEL WITH GFR, Urinalysis, Routine w reflex microscopic, Sedimentation rate, ANA, C3 and C4, Anti-DNA antibody, double-stranded, VITAMIN D 25 Hydroxy (Vit-D Deficiency, Fractures)  Fibromyalgia  Other fatigue - Plan: VITAMIN D 25 Hydroxy (Vit-D Deficiency, Fractures)  Primary insomnia  Malignant neoplasm of lower-inner quadrant of left female breast, unspecified estrogen receptor status ([facility])  History of vitamin D deficiency   Plan #1: Systemic lupus erythematosus. Doing well with methotrexate at 0.7 mL's except patient is having some hair loss which she is concerned about. She did not have this problem when she was on oral methotrexate. We discussed the option of decreasing her methotrexate to a lower dose to continue having the maximum relief that she is currently getting without the side effects that she is concerned about.  #2: High-risk prescription. Methotrexate 0.7 pills every week Folic acid 2 mg daily Patient has needle phobia and her husband is giving her that methotrexate injectable indication. Due to her current side effect, we want to make sure that she is getting the proper dose of the medication and so the patient and the husband will come in in the next few days to have training with the nurse.  New dose of methotrexate:::> She will do methotrexate 0.6 ML's every week (if she continues to do well and she has no arthralgia, we can even try 0.5 ML's every week.) For this reason, I have invited the patient to return to clinic in 3 months so we can do dose adjustments  #3: Alopecia. See above. Probably due to methotrexate Patient was reminded the Biotin would be useful for alopecia. Patient states that she is overusing biotin In addition, Dr. Estanislado Pandy  recommended over-the-counter Rogaine. Patient has not tried that yet and she is interested in trying it.  #4: Fatigue. I'll check her vitamin D levels to be sure that her levels are in proper range.  #5: Fibromyalgia. Ongoing discomfort. She rates her fibromyalgia as about 4-5 on a scale of 0-10.  #6: Return to clinic in 3 months  #7: CBC with differential, CMP with GFR, urinalysis, sedimentation rate, ANA with titer, C3-C4, double-stranded DNA, vitamin D done today  Orders: Orders Placed This Encounter  Procedures  . CBC with Differential/Platelet  . COMPLETE METABOLIC PANEL WITH GFR  . Urinalysis, Routine w reflex microscopic  . Sedimentation rate  . ANA  . C3 and C4  . Anti-DNA antibody, double-stranded  . VITAMIN D 25 Hydroxy (Vit-D Deficiency, Fractures)   Meds ordered  this encounter  Medications  . folic acid (FOLVITE) 1 MG tablet    Sig: Take 2 tablets (2 mg total) by mouth daily.    Dispense:  180 tablet    Refill:  3    Order Specific Question:   Supervising Provider    Answer:   Bo Merino [[id number]]  . Tuberculin-Allergy Syringes 27G X 1/2" 1 ML KIT    Sig: Inject 1 Syringe into the skin once a week. If syringe size greater than 7m, please call me at my office.    Dispense:  12 each    Refill:  4    Order Specific Question:   Supervising Provider    Answer:   DLyda Perone . methotrexate 50 MG/2ML injection    Sig: Inject 0.7 mLs (17.5 mg total) into the skin once a week.    Dispense:  10 mL    Refill:  0    Dispense 90 day supply with preservatives only    Order Specific Question:   Supervising Provider    Answer:   DBo Merino[[[id number]   Face-to-face time spent with patient was 30 minutes. 50% of time was spent in counseling and coordination of care.  Follow-Up Instructions: Return in about 3 months (around 06/23/2017) for SLE,SCLE, Mtx 01.0YT folic 261mqd, hair falling out, .   NaEliezer LoftsPA-C   Patient had no synovitis on  my exam today. As she is having intolerance to higher dose of methotrexate I agree with decreasing her methotrexate dose. I examined and evaluated the patient with NaEliezer LoftsA. The plan of care was discussed as noted above.  ShBo MerinoMD Note - This record has been created using DrEditor, commissioning Chart creation errors have been sought, but may not always  have been located. Such creation errors do not reflect on  the standard of medical care.

## 2017-03-24 ENCOUNTER — Encounter: Payer: Self-pay | Admitting: Rheumatology

## 2017-03-24 ENCOUNTER — Ambulatory Visit (INDEPENDENT_AMBULATORY_CARE_PROVIDER_SITE_OTHER): Payer: BLUE CROSS/BLUE SHIELD | Admitting: Rheumatology

## 2017-03-24 VITALS — BP 116/74 | HR 82 | Resp 13 | Ht 59.0 in | Wt 179.0 lb

## 2017-03-24 DIAGNOSIS — Z79899 Other long term (current) drug therapy: Secondary | ICD-10-CM | POA: Diagnosis not present

## 2017-03-24 DIAGNOSIS — R5383 Other fatigue: Secondary | ICD-10-CM | POA: Diagnosis not present

## 2017-03-24 DIAGNOSIS — M328 Other forms of systemic lupus erythematosus: Secondary | ICD-10-CM | POA: Diagnosis not present

## 2017-03-24 DIAGNOSIS — C50312 Malignant neoplasm of lower-inner quadrant of left female breast: Secondary | ICD-10-CM

## 2017-03-24 DIAGNOSIS — M797 Fibromyalgia: Secondary | ICD-10-CM

## 2017-03-24 DIAGNOSIS — F5101 Primary insomnia: Secondary | ICD-10-CM

## 2017-03-24 DIAGNOSIS — Z8639 Personal history of other endocrine, nutritional and metabolic disease: Secondary | ICD-10-CM | POA: Diagnosis not present

## 2017-03-24 LAB — CBC WITH DIFFERENTIAL/PLATELET
BASOS ABS: 0 {cells}/uL (ref 0–200)
Basophils Relative: 0 %
EOS ABS: 94 {cells}/uL (ref 15–500)
Eosinophils Relative: 2 %
HCT: 39.5 % (ref 35.0–45.0)
Hemoglobin: 13.4 g/dL (ref 11.7–15.5)
LYMPHS PCT: 34 %
Lymphs Abs: 1598 cells/uL (ref 850–3900)
MCH: 31.6 pg (ref 27.0–33.0)
MCHC: 33.9 g/dL (ref 32.0–36.0)
MCV: 93.2 fL (ref 80.0–100.0)
MONOS PCT: 10 %
MPV: 10.5 fL (ref 7.5–12.5)
Monocytes Absolute: 470 cells/uL (ref 200–950)
Neutro Abs: 2538 cells/uL (ref 1500–7800)
Neutrophils Relative %: 54 %
PLATELETS: 297 10*3/uL (ref 140–400)
RBC: 4.24 MIL/uL (ref 3.80–5.10)
RDW: 15.2 % — AB (ref 11.0–15.0)
WBC: 4.7 10*3/uL (ref 3.8–10.8)

## 2017-03-24 LAB — COMPLETE METABOLIC PANEL WITH GFR
ALT: 38 U/L — ABNORMAL HIGH (ref 6–29)
AST: 34 U/L (ref 10–35)
Albumin: 4.3 g/dL (ref 3.6–5.1)
Alkaline Phosphatase: 84 U/L (ref 33–130)
BILIRUBIN TOTAL: 0.4 mg/dL (ref 0.2–1.2)
BUN: 18 mg/dL (ref 7–25)
CHLORIDE: 105 mmol/L (ref 98–110)
CO2: 28 mmol/L (ref 20–31)
Calcium: 9 mg/dL (ref 8.6–10.4)
Creat: 0.84 mg/dL (ref 0.50–0.99)
GFR, EST AFRICAN AMERICAN: 86 mL/min (ref >=60)
GFR, Est Non African American: 75 mL/min (ref >=60)
Glucose, Bld: 88 mg/dL (ref 65–99)
POTASSIUM: 4.2 mmol/L (ref 3.5–5.3)
Sodium: 140 mmol/L (ref 135–146)
Total Protein: 6.7 g/dL (ref 6.1–8.1)

## 2017-03-24 MED ORDER — FOLIC ACID 1 MG PO TABS: 2.0000 mg | ORAL_TABLET | Freq: Every day | ORAL | 3 refills | Status: DC

## 2017-03-24 MED ORDER — METHOTREXATE SODIUM CHEMO INJECTION 50 MG/2ML: 17.5000 mg | INTRAMUSCULAR | 0 refills | Status: DC

## 2017-03-24 MED ORDER — "TUBERCULIN-ALLERGY SYRINGES 27G X 1/2"" 1 ML KIT": 1.0000 | PACK | 4 refills | Status: DC

## 2017-03-25 LAB — SEDIMENTATION RATE: Sed Rate: 9 mm/hr (ref 0–30)

## 2017-03-25 LAB — C3 AND C4
C3 COMPLEMENT: 162 mg/dL (ref 83–193)
C4 Complement: 30 mg/dL (ref 15–57)

## 2017-03-25 LAB — URINALYSIS, ROUTINE W REFLEX MICROSCOPIC
Bilirubin Urine: NEGATIVE
Glucose, UA: NEGATIVE
Hgb urine dipstick: NEGATIVE
Ketones, ur: NEGATIVE
Nitrite: NEGATIVE
Protein, ur: NEGATIVE
Specific Gravity, Urine: 1.018 (ref 1.001–1.035)
pH: 6 (ref 5.0–8.0)

## 2017-03-25 LAB — URINALYSIS, MICROSCOPIC ONLY
Bacteria, UA: NONE SEEN [HPF]
Casts: NONE SEEN [LPF]
RBC / HPF: NONE SEEN RBC/HPF
Yeast: NONE SEEN [HPF]

## 2017-03-25 LAB — ANTI-NUCLEAR AB-TITER (ANA TITER): ANA Titer 1: 1:160 {titer} — ABNORMAL HIGH

## 2017-03-25 LAB — VITAMIN D 25 HYDROXY (VIT D DEFICIENCY, FRACTURES): Vit D, 25-Hydroxy: 49 ng/mL (ref 30–100)

## 2017-03-25 LAB — ANTI-DNA ANTIBODY, DOUBLE-STRANDED: ds DNA Ab: <1 IU/mL

## 2017-03-25 LAB — ANA: Anti Nuclear Antibody(ANA): POSITIVE — AB

## 2017-03-31 ENCOUNTER — Ambulatory Visit
Admission: RE | Admit: 2017-03-31 | Discharge: 2017-03-31 | Disposition: A | Discharge status: Home / Self Care | Payer: BLUE CROSS/BLUE SHIELD | Source: Ambulatory Visit | Attending: Obstetrics and Gynecology | Admitting: Obstetrics and Gynecology

## 2017-03-31 DIAGNOSIS — Z853 Personal history of malignant neoplasm of breast: Secondary | ICD-10-CM

## 2017-03-31 HISTORY — DX: Malignant neoplasm of unspecified site of unspecified female breast: C50.919

## 2017-05-05 ENCOUNTER — Telehealth: Payer: Self-pay | Admitting: Rheumatology

## 2017-05-05 NOTE — Telephone Encounter (Signed)
Patient advised the stinging sensation is probably not related to her lupus or methotrexate use. Patient advise to follow up with PCP for possible fibromyalgia flare. Patient verbalized understanding.

## 2017-05-05 NOTE — Telephone Encounter (Signed)
Patient states she has a "stinging" sensation to her skin. Patient states this started Thursday of last week. Patient states it is worse in the morning. Patient state she is having pain in her hands, head and legs and ankles. Patient states she has noticed swelling in her fingers. Patient states we decreased her MTX about 4-5 months ago due to hair loss. Please advise.

## 2017-05-05 NOTE — Telephone Encounter (Signed)
Patient states the dosage of her MTX has been decreased and she is now experiencing her skin "stinging." she feels as though she is sunburnt but has not been out in the sun. She is also complaining of being "achy." Patient is requesting a call back about her symptoms please.

## 2017-05-05 NOTE — Telephone Encounter (Signed)
A stinging sensation of her skin is probably not related to her lupus or methotrexate use. I would recommend seeing her PCP for possible fibromyalgia flare

## 2017-05-26 ENCOUNTER — Ambulatory Visit: Payer: BLUE CROSS/BLUE SHIELD | Admitting: Neurology

## 2017-05-26 DIAGNOSIS — Z029 Encounter for administrative examinations, unspecified: Secondary | ICD-10-CM

## 2017-05-27 ENCOUNTER — Encounter: Payer: Self-pay | Admitting: Neurology

## 2017-06-22 ENCOUNTER — Ambulatory Visit: Payer: BLUE CROSS/BLUE SHIELD | Admitting: Rheumatology

## 2017-06-22 NOTE — Progress Notes (Deleted)
Office Visit Note  Patient: Deborah Levy             Date of Birth: April 03, 1956           MRN: [id number]             PCP: Serita Grit, PA-C Referring: Serita Grit, PA* Visit Date: 06/22/2017 Occupation: '@GUAROCC'$ @    Subjective:  No chief complaint on file.   History of Present Illness: Deborah Levy is a 61 y.o. female  Was last seen in our office on 03/24/2017 for systemic lupus erythematosus and subcutaneous lupus erythematosus and high risk prescription and alopecia. We asked her to return back today to check on the above. Patient was having intolerance to the higher dose of methotrexate. At the last appointment, we asked the patient to decrease her dose of methotrexate from 0.7 mL's every week to 0.6. If she is doing well at that dose, she can even go down to 0.3 ML's. Today's office visit will allow [place] to see what dose she is using and responding well to. Also we would like to see if the alopecia improved with the lower dose of methotrexate. In addition, we've asked the patient to use over-the-counter Rogaine to see if that will help her alopecia.  Patient also has a history of fibromyalgia and at the last visit, her pain was 4-5 on a scale of 0-10.  Today,***  Activities of Daily Living:  Patient reports morning stiffness for 15 minutes.   Patient Denies nocturnal pain.  Difficulty dressing/grooming: Denies Difficulty climbing stairs: Denies Difficulty getting out of chair: Denies Difficulty using hands for taps, buttons, cutlery, and/or writing: Denies   Review of Systems  Constitutional: Negative for fatigue.  HENT: Negative for mouth sores and mouth dryness.   Eyes: Negative for dryness.  Respiratory: Negative for shortness of breath.   Gastrointestinal: Negative for constipation and diarrhea.  Musculoskeletal: Negative for myalgias and myalgias.  Skin: Negative for sensitivity to sunlight.  Psychiatric/Behavioral: Negative for decreased  concentration and sleep disturbance.    PMFS History:  Patient Active Problem List   Diagnosis Date Noted  . Other fatigue 03/23/2017  . Primary insomnia 03/23/2017  . History of vitamin D deficiency 03/23/2017  . High risk medication use 10/18/2016  . 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 Type 2 diabetes mellitus ([place]) 11/22/2014  . Chronic pain 11/13/2014  . Fibromyalgia 11/13/2014  . Rotator cuff impingement syndrome 11/13/2014  . Cannot sleep 02/13/2014  . Essential (primary) hypertension 09/07/2013  . HLD (hyperlipidemia) 09/07/2013  . Adiposity 09/07/2013  . Systemic lupus erythematosus ([place]) 05/21/2013  . Hypokalemia 04/21/2013  . Breast cancer of lower-inner quadrant of left female breast ([place]) 01/12/2012    Past Medical History:  Diagnosis Date  . Breast cancer ([place])   . Cancer ([place])   . Fibromyalgia   . GERD (gastroesophageal reflux disease)   . Hypertension   . Lupus     Family History  Problem Relation Age of Onset  . Cancer Father        colon  . Hypertension Father   . Heart disease Father   . Cancer Sister        Cervical  . Heart disease Maternal Grandfather   . Hypertension Paternal Grandmother   . Heart disease Paternal Grandmother    Past Surgical History:  Procedure Laterality Date  . BREAST LUMPECTOMY Left   . BREAST SURGERY  03/10/2011   Lt br  lumpectomy  . COLONOSCOPY     Removed 2 polpys   . KNEE SURGERY    . SHOULDER SURGERY    . TUBAL LIGATION     Social History   Social History Narrative  . No narrative on file     Objective: Vital Signs: There were no vitals taken for this visit.   Physical Exam  Constitutional: She is oriented to person, place, and time. She appears well-developed and well-nourished.  HENT:  Head: Normocephalic and atraumatic.  Eyes: EOM are normal. Pupils are equal, round, and reactive to light.  Cardiovascular: Normal rate, regular rhythm and normal heart sounds.  Exam  reveals no gallop and no friction rub.   No murmur heard. Pulmonary/Chest: Effort normal and breath sounds normal. She has no wheezes. She has no rales.  Abdominal: Soft. Bowel sounds are normal. She exhibits no distension. There is no tenderness. There is no guarding. No hernia.  Musculoskeletal: Normal range of motion. She exhibits no edema, tenderness or deformity.  Lymphadenopathy:    She has no cervical adenopathy.  Neurological: She is alert and oriented to person, place, and time. Coordination normal.  Skin: Skin is warm and dry. Capillary refill takes less than 2 seconds. No rash noted.  Psychiatric: She has a normal mood and affect. Her behavior is normal.  Nursing note and vitals reviewed.    Musculoskeletal Exam:  Full range of motion of all joints Grip strength is equal and strong bilaterally Fibromyalgia tender points are all absent  CDAI Exam: CDAI Homunculus Exam:   Joint Counts:  CDAI Tender Joint count: 0 CDAI Swollen Joint count: 0   No synovitis on examination  Investigation: No additional findings. Office Visit on 03/24/2017  Component Date Value Ref Range Status  . WBC 03/24/2017 4.7  3.8 - 10.8 K/uL Final  . RBC 03/24/2017 4.24  3.80 - 5.10 MIL/uL Final  . Hemoglobin 03/24/2017 13.4  11.7 - 15.5 g/dL Final  . HCT 03/24/2017 39.5  35.0 - 45.0 % Final  . MCV 03/24/2017 93.2  80.0 - 100.0 fL Final  . MCH 03/24/2017 31.6  27.0 - 33.0 pg Final  . MCHC 03/24/2017 33.9  32.0 - 36.0 g/dL Final  . RDW 03/24/2017 15.2* 11.0 - 15.0 % Final  . Platelets 03/24/2017 297  140 - 400 K/uL Final  . MPV 03/24/2017 10.5  7.5 - 12.5 fL Final  . Neutro Abs 03/24/2017 2538  1,500 - 7,800 cells/uL Final  . Lymphs Abs 03/24/2017 1598  850 - 3,900 cells/uL Final  . Monocytes Absolute 03/24/2017 470  200 - 950 cells/uL Final  . Eosinophils Absolute 03/24/2017 94  15 - 500 cells/uL Final  . Basophils Absolute 03/24/2017 0  0 - 200 cells/uL Final  . Neutrophils Relative %  03/24/2017 54  % Final  . Lymphocytes Relative 03/24/2017 34  % Final  . Monocytes Relative 03/24/2017 10  % Final  . Eosinophils Relative 03/24/2017 2  % Final  . Basophils Relative 03/24/2017 0  % Final  . Smear Review 03/24/2017 Criteria for review not met   Final  . Sodium 03/24/2017 140  135 - 146 mmol/L Final  . Potassium 03/24/2017 4.2  3.5 - 5.3 mmol/L Final  . Chloride 03/24/2017 105  98 - 110 mmol/L Final  . CO2 03/24/2017 28  20 - 31 mmol/L Final  . Glucose, Bld 03/24/2017 88  65 - 99 mg/dL Final  . BUN 03/24/2017 18  7 - 25 mg/dL Final  .  Creat 03/24/2017 0.84  0.50 - 0.99 mg/dL Final   Comment:   For patients > or = 63 years of age: The upper reference limit for Creatinine is approximately 13% higher for people identified as African-American.     . Total Bilirubin 03/24/2017 0.4  0.2 - 1.2 mg/dL Final  . Alkaline Phosphatase 03/24/2017 84  33 - 130 U/L Final  . AST 03/24/2017 34  10 - 35 U/L Final  . ALT 03/24/2017 38* 6 - 29 U/L Final  . Total Protein 03/24/2017 6.7  6.1 - 8.1 g/dL Final  . Albumin 03/24/2017 4.3  3.6 - 5.1 g/dL Final  . Calcium 03/24/2017 9.0  8.6 - 10.4 mg/dL Final  . GFR, Est African American 03/24/2017 86  >=60 mL/min Final  . GFR, Est Non African American 03/24/2017 75  >=60 mL/min Final  . Color, Urine 03/24/2017 YELLOW  YELLOW Final  . APPearance 03/24/2017 CLEAR  CLEAR Final  . Specific Gravity, Urine 03/24/2017 1.018  1.001 - 1.035 Final  . pH 03/24/2017 6.0  5.0 - 8.0 Final  . Glucose, UA 03/24/2017 NEGATIVE  NEGATIVE Final  . Bilirubin Urine 03/24/2017 NEGATIVE  NEGATIVE Final  . Ketones, ur 03/24/2017 NEGATIVE  NEGATIVE Final  . Hgb urine dipstick 03/24/2017 NEGATIVE  NEGATIVE Final  . Protein, ur 03/24/2017 NEGATIVE  NEGATIVE Final  . Nitrite 03/24/2017 NEGATIVE  NEGATIVE Final  . Leukocytes, UA 03/24/2017 2+* NEGATIVE Final  . Sed Rate 03/24/2017 9  0 - 30 mm/hr Final  . Anit Nuclear Antibody(ANA) 03/24/2017 POS* NEGATIVE Final  .  C3 Complement 03/24/2017 162  83 - 193 mg/dL Final  . C4 Complement 03/24/2017 30  15 - 57 mg/dL Final  . ds DNA Ab 03/24/2017 <1  IU/mL Final   Comment:                                 IU/mL       Interpretation                               < or = 4    Negative                               5-9         Indeterminate                               > or = 10   Positive     . Vit D, 25-Hydroxy 03/24/2017 49  30 - 100 ng/mL Final   Comment: Vitamin D Status           25-OH Vitamin D        Deficiency                <20 ng/mL        Insufficiency         20 - 29 ng/mL        Optimal             > or = 30 ng/mL   For 25-OH Vitamin D testing on patients on D2-supplementation and patients for whom quantitation of D2 and D3 fractions is required, the Quest[facility] 25-OH VIT D, (D2,D3), LC/MS/MS is recommended: order code [phone] (  patients > 2 yrs).   . ANA Pattern 1 03/24/2017 SPECKLED*  Final  . ANA Titer 1 03/24/2017 1:160* titer Final   Comment:           Reference Range           < 1:40      Negative             1:40-1:80 Low Antibody level           > 1:80      Elevated Antibody level   . WBC, UA 03/24/2017 0-5  <=5 WBC/HPF Final   Confirmed by microscopic review.  . RBC / HPF 03/24/2017 NONE SEEN  <=2 RBC/HPF Final  . Squamous Epithelial / LPF 03/24/2017 0-5  <=5 HPF Final  . Bacteria, UA 03/24/2017 NONE SEEN  NONE SEEN HPF Final  . Crystals 03/24/2017 See Below* NONE SEEN HPF Final   Calcium Oxalate Crystals             FEW       ABN   NONE SEEN     HPF  . Casts 03/24/2017 NONE SEEN  NONE SEEN LPF Final  . Yeast 03/24/2017 NONE SEEN  NONE SEEN HPF Final  Orders Only on 02/03/2017  Component Date Value Ref Range Status  . WBC 02/03/2017 3.6* 3.8 - 10.8 K/uL Final  . RBC 02/03/2017 3.96  3.80 - 5.10 MIL/uL Final  . Hemoglobin 02/03/2017 12.4  11.7 - 15.5 g/dL Final  . HCT 02/03/2017 36.7  35.0 - 45.0 % Final  . MCV 02/03/2017 92.7  80.0 - 100.0 fL Final  . MCH 02/03/2017 31.3   27.0 - 33.0 pg Final  . MCHC 02/03/2017 33.8  32.0 - 36.0 g/dL Final  . RDW 02/03/2017 15.0  11.0 - 15.0 % Final  . Platelets 02/03/2017 250  140 - 400 K/uL Final  . MPV 02/03/2017 10.3  7.5 - 12.5 fL Final  . Neutro Abs 02/03/2017 1908  1,500 - 7,800 cells/uL Final  . Lymphs Abs 02/03/2017 1080  850 - 3,900 cells/uL Final  . Monocytes Absolute 02/03/2017 504  200 - 950 cells/uL Final  . Eosinophils Absolute 02/03/2017 72  15 - 500 cells/uL Final  . Basophils Absolute 02/03/2017 36  0 - 200 cells/uL Final  . Neutrophils Relative % 02/03/2017 53  % Final  . Lymphocytes Relative 02/03/2017 30  % Final  . Monocytes Relative 02/03/2017 14  % Final  . Eosinophils Relative 02/03/2017 2  % Final  . Basophils Relative 02/03/2017 1  % Final  . Smear Review 02/03/2017 Criteria for review not met   Final  . Sodium 02/03/2017 142  135 - 146 mmol/L Final  . Potassium 02/03/2017 4.1  3.5 - 5.3 mmol/L Final  . Chloride 02/03/2017 107  98 - 110 mmol/L Final  . CO2 02/03/2017 28  20 - 31 mmol/L Final  . Glucose, Bld 02/03/2017 79  65 - 99 mg/dL Final  . BUN 02/03/2017 13  7 - 25 mg/dL Final  . Creat 02/03/2017 0.91  0.50 - 0.99 mg/dL Final   Comment:   For patients > or = 63 years of age: The upper reference limit for Creatinine is approximately 13% higher for people identified as African-American.     . Total Bilirubin 02/03/2017 0.4  0.2 - 1.2 mg/dL Final  . Alkaline Phosphatase 02/03/2017 76  33 - 130 U/L Final  . AST 02/03/2017 35  10 - 35 U/L Final  . ALT  02/03/2017 37* 6 - 29 U/L Final  . Total Protein 02/03/2017 6.6  6.1 - 8.1 g/dL Final  . Albumin 02/03/2017 4.0  3.6 - 5.1 g/dL Final  . Calcium 02/03/2017 9.0  8.6 - 10.4 mg/dL Final  . GFR, Est African American 02/03/2017 78  >=60 mL/min Final  . GFR, Est Non African American 02/03/2017 68  >=60 mL/min Final     Imaging: No results found.  Speciality Comments: No specialty comments available.    Procedures:  No procedures  performed Allergies: Sulfa antibiotics; Metformin; and Sulfasalazine   Assessment / Plan:     Visit Diagnoses: No diagnosis found.    Plan: #1: Systemic lupus erythematosus/subcutaneous lupus erythematosus with ptosis. Patient is doing well.  #2: High risk prescription Methotrexate*** Folic acid*** Autoimmune workup done on 03/24/2017 which shows positive ANA SPECKLED*  1:160*  C3-C4 were normal. Please see lab results for full details  #3: Labs are due today; CBC with differential and CMP with GFR***  Orders: No orders of the defined types were placed in this encounter.  No orders of the defined types were placed in this encounter.   Face-to-face time spent with patient was 30 minutes. 50% of time was spent in counseling and coordination of care.  Follow-Up Instructions: No Follow-up on file.   Eliezer Lofts, PA-C  Note - This record has been created using [employer].  Chart creation errors have been sought, but may not always  have been located. Such creation errors do not reflect on  the standard of medical care.

## 2017-06-30 ENCOUNTER — Ambulatory Visit: Payer: BLUE CROSS/BLUE SHIELD | Admitting: Rheumatology

## 2017-07-01 ENCOUNTER — Encounter: Payer: Self-pay | Admitting: Rheumatology

## 2017-07-01 ENCOUNTER — Ambulatory Visit (INDEPENDENT_AMBULATORY_CARE_PROVIDER_SITE_OTHER): Payer: BLUE CROSS/BLUE SHIELD | Admitting: Rheumatology

## 2017-07-01 VITALS — BP 138/80 | HR 82 | Resp 16 | Ht 60.0 in | Wt 176.0 lb

## 2017-07-01 DIAGNOSIS — Z8719 Personal history of other diseases of the digestive system: Secondary | ICD-10-CM | POA: Insufficient documentation

## 2017-07-01 DIAGNOSIS — Z853 Personal history of malignant neoplasm of breast: Secondary | ICD-10-CM | POA: Diagnosis not present

## 2017-07-01 DIAGNOSIS — L932 Other local lupus erythematosus: Secondary | ICD-10-CM

## 2017-07-01 DIAGNOSIS — Z8639 Personal history of other endocrine, nutritional and metabolic disease: Secondary | ICD-10-CM

## 2017-07-01 DIAGNOSIS — M3219 Other organ or system involvement in systemic lupus erythematosus: Secondary | ICD-10-CM

## 2017-07-01 DIAGNOSIS — Z8679 Personal history of other diseases of the circulatory system: Secondary | ICD-10-CM

## 2017-07-01 DIAGNOSIS — F5101 Primary insomnia: Secondary | ICD-10-CM

## 2017-07-01 DIAGNOSIS — H35389 Toxic maculopathy, unspecified eye: Secondary | ICD-10-CM | POA: Diagnosis not present

## 2017-07-01 DIAGNOSIS — Z79899 Other long term (current) drug therapy: Secondary | ICD-10-CM

## 2017-07-01 DIAGNOSIS — M797 Fibromyalgia: Secondary | ICD-10-CM

## 2017-07-01 DIAGNOSIS — Z8659 Personal history of other mental and behavioral disorders: Secondary | ICD-10-CM

## 2017-07-01 DIAGNOSIS — R5383 Other fatigue: Secondary | ICD-10-CM | POA: Diagnosis not present

## 2017-07-01 DIAGNOSIS — T372X5A Adverse effect of antimalarials and drugs acting on other blood protozoa, initial encounter: Secondary | ICD-10-CM

## 2017-07-01 LAB — COMPLETE METABOLIC PANEL WITH GFR
ALK PHOS: 86 U/L (ref 33–130)
ALT: 41 U/L — ABNORMAL HIGH (ref 6–29)
AST: 38 U/L — AB (ref 10–35)
Albumin: 4.3 g/dL (ref 3.6–5.1)
BUN: 15 mg/dL (ref 7–25)
CALCIUM: 9.4 mg/dL (ref 8.6–10.4)
CHLORIDE: 104 mmol/L (ref 98–110)
CO2: 30 mmol/L (ref 20–31)
Creat: 0.9 mg/dL (ref 0.50–0.99)
GFR, EST AFRICAN AMERICAN: 79 mL/min (ref >=60)
GFR, EST NON AFRICAN AMERICAN: 68 mL/min (ref >=60)
Glucose, Bld: 93 mg/dL (ref 65–99)
POTASSIUM: 4.5 mmol/L (ref 3.5–5.3)
Sodium: 142 mmol/L (ref 135–146)
Total Bilirubin: 0.4 mg/dL (ref 0.2–1.2)
Total Protein: 6.7 g/dL (ref 6.1–8.1)

## 2017-07-01 LAB — CBC WITH DIFFERENTIAL/PLATELET
Basophils Absolute: 54 cells/uL (ref 0–200)
Basophils Relative: 1 %
Eosinophils Absolute: 108 cells/uL (ref 15–500)
Eosinophils Relative: 2 %
HEMATOCRIT: 40.2 % (ref 35.0–45.0)
Hemoglobin: 13.6 g/dL (ref 11.7–15.5)
LYMPHS PCT: 28 %
Lymphs Abs: 1512 cells/uL (ref 850–3900)
MCH: 31.8 pg (ref 27.0–33.0)
MCHC: 33.8 g/dL (ref 32.0–36.0)
MCV: 93.9 fL (ref 80.0–100.0)
MONO ABS: 702 {cells}/uL (ref 200–950)
MPV: 10 fL (ref 7.5–12.5)
Monocytes Relative: 13 %
Neutro Abs: 3024 cells/uL (ref 1500–7800)
Neutrophils Relative %: 56 %
Platelets: 314 10*3/uL (ref 140–400)
RBC: 4.28 MIL/uL (ref 3.80–5.10)
RDW: 14.5 % (ref 11.0–15.0)
WBC: 5.4 10*3/uL (ref 3.8–10.8)

## 2017-07-01 MED ORDER — METHOTREXATE SODIUM CHEMO INJECTION 50 MG/2ML: 15.0000 mg | INTRAMUSCULAR | 0 refills | Status: DC

## 2017-07-01 MED ORDER — "TUBERCULIN-ALLERGY SYRINGES 27G X 1/2"" 1 ML KIT": 1.0000 | PACK | 4 refills | Status: DC

## 2017-07-01 MED ORDER — ONDANSETRON HCL 4 MG PO TABS: ORAL_TABLET | ORAL | 2 refills | Status: DC

## 2017-07-01 NOTE — Patient Instructions (Signed)
Reduce methotrexate dose to 0.6 mL under the skin weekly  Consider taking magnesium malate   Standing Labs We placed an order today for your standing lab work.    Please come back and get your standing labs in October 2018 and every 3 months.  We have open lab Monday through Friday from 8:30-11:30 AM and 1:30-4 PM at the office of Dr. Bo Merino.   The office is located at [address] [place], [place], [place] [address] No appointment is necessary.   Labs are drawn by [facility].  You may receive a bill from [facility] for your lab work. If you have any questions regarding directions or hours of operation,  please call [phone].

## 2017-07-01 NOTE — Progress Notes (Signed)
Rheumatology Medication Review by a Pharmacist Does the patient feel that his/her medications are working for him/her?  Yes Has the patient been experiencing any side effects to the medications prescribed?  Yes, patient reports hair loss and nausea with methotrexate.  Does the patient have any problems obtaining medications?  No  Issues to address at subsequent visits: Tolerance to methotrexate   Pharmacist comments:  Mrs. Glendenning is a 61 yo F who presents for follow up.  She reports she is currently taking methotrexate 0.7 mL subcutaneous weekly which is injected by her husband and folic acid 2 mg by mouth daily.  Patient was instructed to reduce methotrexate dose to 0.6 mL at last visit due to concern about hair loss, but she reports her husband continued to administer 0.7 mL weekly because that was written on the prescription instructions.  Most recent standing labs were on 03/24/17.  She will be due for standing labs again today.    Elisabeth Most, Pharm.D., BCPS, CPP Clinical Pharmacist Pager: [phone] Phone: [phone] 07/01/2017 4:45 PM

## 2017-07-01 NOTE — Progress Notes (Signed)
Office Visit Note  Patient: Deborah Levy             Date of Birth: [birth year]           MRN: [id number]             PCP: Serita Grit, PA-C Referring: Serita Grit, PA* Visit Date: 07/01/2017 Occupation: '@GUAROCC'$ @    Subjective:  Pain of the Right Hand (With swelling); Medication Management; Alopecia (wants to know about decreasing the MTX ); Nausea; and Leg Pain (lower legs painful and swollen)   History of Present Illness: Deborah Levy is a 61 y.o. female with history of systemic lupus erythematosus and fibromyalgia syndrome. He states she continues to have some hair loss due to methotrexate. She did not reduce her dose of methotrexate. She gives history of intermittent swelling in her hands and also discomfort in her feet and ankles.   Activities of Daily  Living:  Patient reports morning stiffness for one hour.   Patient Reports nocturnal pain.  Difficulty dressing/grooming: Denies Difficulty climbing stairs: Reports Difficulty getting out of chair: Reports Difficulty using hands for taps, buttons, cutlery, and/or writing: Denies   Review of Systems  Constitutional: Positive for appetite change and fatigue. Negative for night sweats, weight gain, weight loss and weakness.       Decreased appetite  HENT: Positive for hearing loss. Negative for mouth sores, trouble swallowing, trouble swallowing, mouth dryness and nose dryness.        Right ear feels like it is stopped up  Eyes: Negative.  Negative for pain, redness, visual disturbance and dryness.  Respiratory: Negative.  Negative for cough, shortness of breath and difficulty breathing.   Cardiovascular: Positive for swelling in legs/feet. Negative for chest pain, palpitations, hypertension and irregular heartbeat.  Gastrointestinal: Negative.  Negative for blood in stool, constipation and diarrhea.  Endocrine: Negative.  Negative for increased urination.  Genitourinary: Negative.  Negative for vaginal  dryness.       Has some urinary frequency   Musculoskeletal: Positive for myalgias and myalgias. Negative for arthralgias, joint pain, joint swelling, muscle weakness, morning stiffness and muscle tenderness.  Skin: Positive for hair loss. Negative for color change, rash, skin tightness, ulcers and sensitivity to sunlight.  Allergic/Immunologic: Negative for susceptible to infections.  Neurological: Positive for dizziness. Negative for memory loss and night sweats.  Hematological: Negative.  Negative for swollen glands.  Psychiatric/Behavioral: Positive for depressed mood. Negative for sleep disturbance. The patient is not nervous/anxious.        Brother has recently been diagnosed with Cancer     PMFS History:  Patient Active Problem List   Diagnosis Date Noted  . 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 History of gastroesophageal reflux (GERD) 07/01/2017  . Other fatigue 03/23/2017  . Primary insomnia 03/23/2017  . History of vitamin D deficiency 03/23/2017  . High risk medication use 10/18/2016  . 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 Type 2 diabetes mellitus ([place]) 11/22/2014  . Chronic pain 11/13/2014  . Fibromyalgia 11/13/2014  . Rotator cuff impingement syndrome 11/13/2014  . Cannot sleep 02/13/2014  . Essential (primary) hypertension 09/07/2013  . HLD (hyperlipidemia) 09/07/2013  . Adiposity 09/07/2013  . Systemic lupus erythematosus ([place]) 05/21/2013  . Hypokalemia 04/21/2013  . Breast cancer of lower-inner quadrant of left female breast ([place]) 01/12/2012    Past Medical History:  Diagnosis Date  . Breast cancer ([place])   . Cancer ([place])   . Fibromyalgia   .  GERD (gastroesophageal reflux disease)   . Hypertension   . Lupus     Family History  Problem Relation Age of Onset  . Cancer Father        colon  . Hypertension Father   . Heart disease Father   . Cancer Sister        Cervical  . Heart disease Maternal Grandfather   .  Hypertension Paternal Grandmother   . Heart disease Paternal Grandmother    Past Surgical History:  Procedure Laterality Date  . BREAST LUMPECTOMY Left   . BREAST SURGERY  03/10/2011   Lt br lumpectomy  . COLONOSCOPY     Removed 2 polpys   . KNEE SURGERY    . SHOULDER SURGERY    . TUBAL LIGATION     Social History   Social History Narrative  . No narrative on file     Objective: Vital Signs: BP 138/80   Pulse 82   Resp 16   Ht 5' (1.524 m)   Wt 176 lb (79.8 kg)   BMI 34.37 kg/m    Physical Exam  Constitutional: She is oriented to person, place, and time. She appears well-developed and well-nourished.  HENT:  Head: Normocephalic and atraumatic.  Eyes: Conjunctivae and EOM are normal.  Neck: Normal range of motion.  Cardiovascular: Normal rate, regular rhythm, normal heart sounds and intact distal pulses.   Pulmonary/Chest: Effort normal and breath sounds normal.  Abdominal: Soft. Bowel sounds are normal.  Lymphadenopathy:    She has no cervical adenopathy.  Neurological: She is alert and oriented to person, place, and time.  Skin: Skin is warm and dry. Capillary refill takes less than 2 seconds.  Psychiatric: She has a normal mood and affect. Her behavior is normal.  Nursing note and vitals reviewed.    Musculoskeletal Exam: C-spine and thoracic spine limited range of motion. Shoulder joints elbow joints wrist joints with good range of motion with no synovitis. She had no synovitis in her hands over MCPs of PIP joints. Ears minimal DIP thickening was noted. Hip joints knee joints ankles MTPs PIPs with good range of motion with no synovitis she had crepitus on palpation of her bilateral knee joints.  CDAI Exam: CDAI Homunculus Exam:   Joint Counts:  CDAI Tender Joint count: 0 CDAI Swollen Joint count: 0  Global Assessments:  Patient Global Assessment: 8 Provider Global Assessment: 2  CDAI Calculated Score: 10    Investigation: Findings:  December 2013:   Sed rate was 4, lupus anticoagulant, beta-2, anticardiolipin antibodies were all negative, hepatitis panel, CK, TSH, vitamin D were normal, C3, C4 was normal, rheumatoid factor and CCP were within normal limits, TB-Gold was negative.  Her Ro antibody was positive at 240, double-strand DNA, La antibodies with Smith RNP, and SCL-70 were negative.     CBC Latest Ref Rng & Units 03/24/2017 02/03/2017 10/21/2016  WBC 3.8 - 10.8 K/uL 4.7 3.6(L) 4.5  Hemoglobin 11.7 - 15.5 g/dL 13.4 12.4 12.9  Hematocrit 35.0 - 45.0 % 39.5 36.7 38.5  Platelets 140 - 400 K/uL 297 250 273    CMP Latest Ref Rng & Units 03/24/2017 02/03/2017 10/21/2016  Glucose 65 - 99 mg/dL 88 79 74  BUN 7 - 25 mg/dL '18 13 14  '$ Creatinine 0.50 - 0.99 mg/dL 0.84 0.91 0.77  Sodium 135 - 146 mmol/L 140 142 141  Potassium 3.5 - 5.3 mmol/L 4.2 4.1 4.7  Chloride 98 - 110 mmol/L 105 107 105  CO2 20 -  31 mmol/L '28 28 28  '$ Calcium 8.6 - 10.4 mg/dL 9.0 9.0 9.0  Total Protein 6.1 - 8.1 g/dL 6.7 6.6 6.3  Total Bilirubin 0.2 - 1.2 mg/dL 0.4 0.4 0.4  Alkaline Phos 33 - 130 U/L 84 76 77  AST 10 - 35 U/L 34 35 41(H)  ALT 6 - 29 U/L 38(H) 37(H) 45(H)    03/24/2017 ANA 1:160 speckled, DS DNA less than 1, ESR 9, C3-C4 normal, vitamin D 49 Imaging: No results found.  Speciality Comments: No specialty comments available.    Procedures:  No procedures performed Allergies: Sulfa antibiotics; Metformin; and Sulfasalazine   Assessment / Plan:     Visit Diagnoses: Other systemic lupus erythematosus with other organ involvement ([place]) Positive ANA Ro and La : Her labs have been stable. Her last labs showed negative double-stranded DNA normal complements and normal sedimentation rate. She does not have any indication of lupus flare.  Subcutaneous lupus erythematosus fatigue arthralgia malar rash and photosensitivity positive subcutaneous lupus on biopsy.  She has no active ischemia lesions currently.  High risk medication use - Methotrexate 0.7 mL  subcutaneous every week along with folic acid 2 mg by mouth daily. We had detailed discussion regarding that she would reduce her dose to 0.6 mL subcutaneous per week. She was also advised to reduce her folic acid to 1 mg by mouth daily. If she does well I would reduce her methotrexate further to 0.4 mL subcutaneous next week. We'll check her labs today and then every 3 months to monitor for drug toxicity.  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  Other fatigue: Chronic due to insomnia.  Primary insomnia: Controlled with multiple medications.  History of vitamin D deficiency: She is on supplement for vitamin D  History of hyperlipidemia  History of diabetes mellitus  History of hypertension  History of breast cancer  History of gastroesophageal reflux (GERD)  History of depression    Orders: Orders Placed This Encounter  Procedures  . CBC with Differential/Platelet  . COMPLETE METABOLIC PANEL WITH GFR   Meds ordered this encounter  Medications  . ondansetron (ZOFRAN) 4 MG tablet    Sig: Use for nausea and gi upset with MTX use.    Dispense:  30 tablet    Refill:  2  . Tuberculin-Allergy Syringes 27G X 1/2" 1 ML KIT    Sig: Inject 1 Syringe into the skin once a week.    Dispense:  12 each    Refill:  4  . methotrexate 50 MG/2ML injection    Sig: Inject 0.6 mLs (15 mg total) into the skin once a week.    Dispense:  10 mL    Refill:  0    Dispense 2 mL vials with preservatives only    Face-to-face time spent with patient was 30 minutes. 50% of time was spent in counseling and coordination of care.  Follow-Up Instructions: Return in about 5 months (around 12/01/2017) for Systemic lupus.   Bo Merino, MD  Note - This record has  been created using Editor, commissioning.  Chart creation errors have been sought, but may not always  have been located. Such creation errors do not reflect on  the standard of medical care.

## 2017-07-02 NOTE — Progress Notes (Signed)
Mild elevation of LFTs. We will continue to monitor.

## 2017-08-10 ENCOUNTER — Telehealth: Payer: Self-pay | Admitting: Rheumatology

## 2017-08-10 NOTE — Telephone Encounter (Signed)
Patient states she in the last couple of weeks getting sores in the insides of her lips. Patient is wondering if it has to do with her Lupus. Patient has recently decreased her MTX from 0.7 mL to 0.6 mL and decreased her folic acid to one daily. Patient would like know what she should do. Patient states she takes her MTX injection on Friday evening, but states she doesn't feel well all weekend. Patient wants to know if there was anything else she could use.

## 2017-08-10 NOTE — Telephone Encounter (Signed)
Deborah Levy should increase Folic acid to 2mg  po ad. If symptoms persist then we will have to change tt

## 2017-08-10 NOTE — Telephone Encounter (Signed)
Per patient medication was reduced, and she now is getting little sores inside her month which is very annoying. Patient has been nauseous, and dizzy since last dose (all weekend), and doesn't know why. Please call to advise.

## 2017-08-11 NOTE — Telephone Encounter (Signed)
Patient advised of recommendations and verbalized understanding.  

## 2017-09-21 ENCOUNTER — Other Ambulatory Visit: Payer: Self-pay

## 2017-09-21 DIAGNOSIS — Z79899 Other long term (current) drug therapy: Secondary | ICD-10-CM

## 2017-09-22 LAB — CBC WITH DIFFERENTIAL/PLATELET
BASOS PCT: 1 %
Basophils Absolute: 30 cells/uL (ref 0–200)
EOS PCT: 2.7 %
Eosinophils Absolute: 81 cells/uL (ref 15–500)
HCT: 38.3 % (ref 35.0–45.0)
HEMOGLOBIN: 12.9 g/dL (ref 11.7–15.5)
Lymphs Abs: 1128 cells/uL (ref 850–3900)
MCH: 30.6 pg (ref 27.0–33.0)
MCHC: 33.7 g/dL (ref 32.0–36.0)
MCV: 91 fL (ref 80.0–100.0)
MONOS PCT: 13.8 %
MPV: 10.6 fL (ref 7.5–12.5)
NEUTROS ABS: 1347 {cells}/uL — AB (ref 1500–7800)
Neutrophils Relative %: 44.9 %
Platelets: 249 10*3/uL (ref 140–400)
RBC: 4.21 10*6/uL (ref 3.80–5.10)
RDW: 13.6 % (ref 11.0–15.0)
Total Lymphocyte: 37.6 %
WBC mixed population: 414 cells/uL (ref 200–950)
WBC: 3 10*3/uL — AB (ref 3.8–10.8)

## 2017-09-22 LAB — COMPLETE METABOLIC PANEL WITH GFR
AG RATIO: 1.6 (calc) (ref 1.0–2.5)
ALBUMIN MSPROF: 3.9 g/dL (ref 3.6–5.1)
ALT: 41 U/L — ABNORMAL HIGH (ref 6–29)
AST: 36 U/L — ABNORMAL HIGH (ref 10–35)
Alkaline phosphatase (APISO): 70 U/L (ref 33–130)
BILIRUBIN TOTAL: 0.4 mg/dL (ref 0.2–1.2)
BUN: 16 mg/dL (ref 7–25)
CALCIUM: 8.9 mg/dL (ref 8.6–10.4)
CHLORIDE: 104 mmol/L (ref 98–110)
CO2: 30 mmol/L (ref 20–32)
Creat: 0.87 mg/dL (ref 0.50–0.99)
GFR, EST AFRICAN AMERICAN: 82 mL/min/{1.73_m2} (ref >=60)
GFR, EST NON AFRICAN AMERICAN: 71 mL/min/{1.73_m2} (ref >=60)
Globulin: 2.4 g/dL (calc) (ref 1.9–3.7)
Glucose, Bld: 139 mg/dL — ABNORMAL HIGH (ref 65–99)
POTASSIUM: 4.7 mmol/L (ref 3.5–5.3)
Sodium: 140 mmol/L (ref 135–146)
TOTAL PROTEIN: 6.3 g/dL (ref 6.1–8.1)

## 2017-09-22 NOTE — Progress Notes (Signed)
WBCs are normal. She is on methotrexate 0.6 and nose per week. She was clinically stable last visit. Please reduce her methotrexate to 0.4 mL subcutaneous every week. Check labs in 2 months.

## 2017-09-23 ENCOUNTER — Other Ambulatory Visit: Payer: Self-pay | Admitting: Radiology

## 2017-09-23 ENCOUNTER — Telehealth: Payer: Self-pay | Admitting: Radiology

## 2017-09-23 NOTE — Telephone Encounter (Signed)
I have called patient to advise labs are abnormal and she will repeat them in 2 months and see [place] next week

## 2017-09-23 NOTE — Telephone Encounter (Signed)
-----   Message from Bo Merino, MD sent at 09/22/2017  1:34 PM EDT ----- WBCs are normal. She is on methotrexate 0.6 and nose per week. She was clinically stable last visit. Please reduce her methotrexate to 0.4 mL subcutaneous every week. Check labs in 2 months.

## 2017-10-13 ENCOUNTER — Ambulatory Visit: Payer: BLUE CROSS/BLUE SHIELD | Admitting: Neurology

## 2017-11-18 NOTE — Progress Notes (Signed)
Office Visit Note  Patient: Deborah Levy             Date of Birth: 09/30/1956           MRN: [id number]             PCP: Serita Grit, PA-C Referring: Serita Grit, PA* Visit Date: 12/01/2017 Occupation: @GUAROCC @    Subjective:  Pain in hands and knees.   History of Present Illness: Deborah Levy is a 61 y.o. female with history of systemic lupus and fibromyalgia. She states she continues to have some discomfort in her bilateral hands and bilateral knee joints. She believes that she's been having some swelling in her bilateral hands. Her fibromyalgia symptoms are stable. She continues to have some generalized pain from fibromyalgia. She is sleeping better with her medications.   Activities of Daily Living:  Patient reports morning stiffness for 1 hour.   Patient Denies nocturnal pain.  Difficulty dressing/grooming: Denies Difficulty climbing stairs: Denies Difficulty getting out of chair: Denies Difficulty using hands for taps, buttons, cutlery, and/or writing: Denies   Review of Systems  Constitutional: Positive for fatigue. Negative for night sweats, weight gain, weight loss and weakness.  HENT: Negative for mouth sores, trouble swallowing, trouble swallowing, mouth dryness and nose dryness.   Eyes: Positive for dryness. Negative for pain, redness and visual disturbance.  Respiratory: Negative for cough, shortness of breath and difficulty breathing.   Cardiovascular: Negative for chest pain, palpitations, hypertension, irregular heartbeat and swelling in legs/feet.  Gastrointestinal: Negative for blood in stool, constipation and diarrhea.  Endocrine: Negative for increased urination.  Genitourinary: Negative for vaginal dryness.  Musculoskeletal: Positive for arthralgias, joint pain, myalgias, morning stiffness and myalgias. Negative for joint swelling, muscle weakness and muscle tenderness.  Skin: Negative for color change, rash, hair loss, skin  tightness, ulcers and sensitivity to sunlight.  Allergic/Immunologic: Negative for susceptible to infections.  Neurological: Negative for dizziness, memory loss and night sweats.  Hematological: Negative for swollen glands.  Psychiatric/Behavioral: Positive for sleep disturbance. Negative for depressed mood. The patient is not nervous/anxious.     PMFS History:  Patient Active Problem List   Diagnosis Date Noted  . 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 History of gastroesophageal reflux (GERD) 07/01/2017  . Other fatigue 03/23/2017  . Primary insomnia 03/23/2017  . History of vitamin D deficiency 03/23/2017  . High risk medication use 10/18/2016  . 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 Type 2 diabetes mellitus ([place]) 11/22/2014  . Chronic pain 11/13/2014  . Fibromyalgia 11/13/2014  . Rotator cuff impingement syndrome 11/13/2014  . Cannot sleep 02/13/2014  . Essential (primary) hypertension 09/07/2013  . HLD (hyperlipidemia) 09/07/2013  . Adiposity 09/07/2013  . Systemic lupus erythematosus ([place]) 05/21/2013  . Hypokalemia 04/21/2013  . Breast cancer of lower-inner quadrant of left female breast ([place]) 01/12/2012    Past Medical History:  Diagnosis Date  . Breast cancer ([place])   . Cancer ([place])   . Fibromyalgia   . GERD (gastroesophageal reflux disease)   . Hypertension   . Lupus     Family History  Problem Relation Age of Onset  . Cancer Father        colon  . Hypertension Father   . Heart disease Father   . Cancer Sister        Cervical  . Cancer Brother        prostate   . Heart disease Maternal Grandfather   .  Hypertension Paternal Grandmother   . Heart disease Paternal Grandmother    Past Surgical History:  Procedure Laterality Date  . BREAST LUMPECTOMY Left   . BREAST SURGERY  03/10/2011   Lt br lumpectomy  . COLONOSCOPY     Removed 2 polpys   . KNEE SURGERY    . SHOULDER SURGERY    . TUBAL LIGATION     Social History    Social History Narrative  . Not on file     Objective: Vital Signs: BP 128/77 (BP Location: Left Arm, Patient Position: Sitting, Cuff Size: Normal)   Pulse 86   Resp 16   Ht 5' (1.524 m)   Wt 185 lb (83.9 kg)   BMI 36.13 kg/m    Physical Exam  Constitutional: She is oriented to person, place, and time. She appears well-developed and well-nourished.  HENT:  Head: Normocephalic and atraumatic.  Eyes: Conjunctivae and EOM are normal.  Neck: Normal range of motion.  Cardiovascular: Normal rate, regular rhythm, normal heart sounds and intact distal pulses.  Pulmonary/Chest: Effort normal and breath sounds normal.  Abdominal: Soft. Bowel sounds are normal.  Lymphadenopathy:    She has no cervical adenopathy.  Neurological: She is alert and oriented to person, place, and time.  Skin: Skin is warm and dry. Capillary refill takes less than 2 seconds.  Psychiatric: She has a normal mood and affect. Her behavior is normal.  Nursing note and vitals reviewed.    Musculoskeletal Exam: C-spine and thoracic lumbar spine some limitation with range of motion. Shoulder joints although joints wrist joint MCPs were good range of motion. She has no synovitis over MCPs. She has DIP PIP thickening in her hands consistent with osteoarthritis. Hip joints knee joints ankles MTPs PIPs with good range of motion with no synovitis. She is crepitus in her bilateral knee joints. She has positive tender points and generalized hyperalgesia.  CDAI Exam: No CDAI exam completed.    Investigation: No additional findings. CBC Latest Ref Rng & Units 09/21/2017 07/01/2017 03/24/2017  WBC 3.8 - 10.8 Thousand/uL 3.0(L) 5.4 4.7  Hemoglobin 11.7 - 15.5 g/dL 12.9 13.6 13.4  Hematocrit 35.0 - 45.0 % 38.3 40.2 39.5  Platelets 140 - 400 Thousand/uL 249 314 297   CMP Latest Ref Rng & Units 09/21/2017 07/01/2017 03/24/2017  Glucose 65 - 99 mg/dL 139(H) 93 88  BUN 7 - 25 mg/dL 16 15 18   Creatinine 0.50 - 0.99 mg/dL 0.87  0.90 0.84  Sodium 135 - 146 mmol/L 140 142 140  Potassium 3.5 - 5.3 mmol/L 4.7 4.5 4.2  Chloride 98 - 110 mmol/L 104 104 105  CO2 20 - 32 mmol/L 30 30 28   Calcium 8.6 - 10.4 mg/dL 8.9 9.4 9.0  Total Protein 6.1 - 8.1 g/dL 6.3 6.7 6.7  Total Bilirubin 0.2 - 1.2 mg/dL 0.4 0.4 0.4  Alkaline Phos 33 - 130 U/L - 86 84  AST 10 - 35 U/L 36(H) 38(H) 34  ALT 6 - 29 U/L 41(H) 41(H) 38(H)   Imaging: No results found.  Speciality Comments: No specialty comments available.    Procedures:  No procedures performed Allergies: Sulfa antibiotics; Metformin; and Sulfasalazine   Assessment / Plan:     Visit Diagnoses: Other systemic lupus erythematosus with other organ involvement ([place]) - Positive ANA Ro and La  -patient has no active synovitis on examination. I do not see any rash on examination. She continues to have mild sicca symptoms. She's been tolerating methotrexate well. Plan: Anti-DNA antibody,  double-stranded, C3 and C4, Sedimentation rate, Urinalysis, Routine w reflex microscopic  Cutaneous lupus erythematosus -  fatigue arthralgia malar rash and photosensitivity positive subcutaneous lupus on biopsy. She has not seen dermatologist in a long time.  High risk medication use - MTX 0.4 mL subcutaneous every week, folic acid 1 mg by mouth daily. Most recent labs showed neutropenia. She also has mild elevation of LFTs. I'll check her labs again in January with autoimmune antibodies. If her labs are stable we can even decreased methotrexate further to 0.3 mL subcutaneous every week.  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  Other fatigue: Related to chronic insomnia and fibromyalgia.  Primary insomnia: Controlled with medications.  Other medical problems are listed as follows:  History of gastroesophageal reflux (GERD)  History of vitamin D deficiency -she is on supplement.  Plan: VITAMIN D 25 Hydroxy (Vit-D Deficiency, Fractures) with next labs.  History of hyperlipidemia  History of depression  History of breast cancer  History of diabetes mellitus  History of hypertension    Orders: Orders Placed This Encounter  Procedures  . Anti-DNA antibody, double-stranded  . C3 and C4  . Sedimentation rate  . Urinalysis, Routine w reflex microscopic  . VITAMIN D 25 Hydroxy (Vit-D Deficiency, Fractures)   No orders of the defined types were placed in this encounter.   Face-to-face time spent with patient was 30 minutes. Greater than 50% of time was spent in counseling and coordination of care.  Follow-Up Instructions: Return in about 5 months (around 05/01/2018) for Systemic lupus, [place].   Bo Merino, MD  Note - This record has been created using Editor, commissioning.  Chart creation errors have been sought, but may not always  have been located. Such creation errors do not reflect on  the standard of medical care.

## 2017-12-01 ENCOUNTER — Ambulatory Visit (INDEPENDENT_AMBULATORY_CARE_PROVIDER_SITE_OTHER): Payer: BLUE CROSS/BLUE SHIELD | Admitting: Rheumatology

## 2017-12-01 ENCOUNTER — Encounter: Payer: Self-pay | Admitting: Rheumatology

## 2017-12-01 VITALS — BP 128/77 | HR 86 | Resp 16 | Ht 60.0 in | Wt 185.0 lb

## 2017-12-01 DIAGNOSIS — F5101 Primary insomnia: Secondary | ICD-10-CM

## 2017-12-01 DIAGNOSIS — M797 Fibromyalgia: Secondary | ICD-10-CM

## 2017-12-01 DIAGNOSIS — Z79899 Other long term (current) drug therapy: Secondary | ICD-10-CM

## 2017-12-01 DIAGNOSIS — R5383 Other fatigue: Secondary | ICD-10-CM

## 2017-12-01 DIAGNOSIS — Z8719 Personal history of other diseases of the digestive system: Secondary | ICD-10-CM

## 2017-12-01 DIAGNOSIS — Z8639 Personal history of other endocrine, nutritional and metabolic disease: Secondary | ICD-10-CM

## 2017-12-01 DIAGNOSIS — H35389 Toxic maculopathy, unspecified eye: Secondary | ICD-10-CM | POA: Diagnosis not present

## 2017-12-01 DIAGNOSIS — Z853 Personal history of malignant neoplasm of breast: Secondary | ICD-10-CM | POA: Diagnosis not present

## 2017-12-01 DIAGNOSIS — Z8679 Personal history of other diseases of the circulatory system: Secondary | ICD-10-CM

## 2017-12-01 DIAGNOSIS — M3219 Other organ or system involvement in systemic lupus erythematosus: Secondary | ICD-10-CM | POA: Diagnosis not present

## 2017-12-01 DIAGNOSIS — Z8659 Personal history of other mental and behavioral disorders: Secondary | ICD-10-CM

## 2017-12-01 DIAGNOSIS — L932 Other local lupus erythematosus: Secondary | ICD-10-CM | POA: Diagnosis not present

## 2017-12-01 DIAGNOSIS — T372X5A Adverse effect of antimalarials and drugs acting on other blood protozoa, initial encounter: Secondary | ICD-10-CM

## 2017-12-01 NOTE — Patient Instructions (Addendum)
Standing Labs We placed an order today for your standing lab work.    Please come back and get your standing labs in January and every 5 months  We have open lab Monday through Friday from 8:30-11:30 AM and 1:30-4 PM at the office of Dr. Bo Merino.   The office is located at [address] [place], [place], [place] [address] No appointment is necessary.   Labs are drawn by [facility].  You may receive a bill from [facility] for your lab work. If you have any questions regarding directions or hours of operation,  please call [phone].

## 2017-12-12 ENCOUNTER — Other Ambulatory Visit: Payer: Self-pay | Admitting: Rheumatology

## 2017-12-14 MED ORDER — METHOTREXATE SODIUM CHEMO INJECTION 50 MG/2ML: 10.0000 mg | INTRAMUSCULAR | 0 refills | Status: DC

## 2017-12-14 NOTE — Telephone Encounter (Signed)
Last Visit: 12/01/17  next Visit: 05/12/18 Labs: 09/21/17 LFT's stable  Okay to refill per Dr. Estanislado Pandy

## 2017-12-31 ENCOUNTER — Other Ambulatory Visit: Payer: Self-pay

## 2017-12-31 ENCOUNTER — Other Ambulatory Visit: Payer: Self-pay | Admitting: *Deleted

## 2017-12-31 DIAGNOSIS — Z8639 Personal history of other endocrine, nutritional and metabolic disease: Secondary | ICD-10-CM

## 2017-12-31 DIAGNOSIS — Z79899 Other long term (current) drug therapy: Secondary | ICD-10-CM

## 2017-12-31 DIAGNOSIS — M3219 Other organ or system involvement in systemic lupus erythematosus: Secondary | ICD-10-CM

## 2018-01-01 LAB — URINALYSIS, ROUTINE W REFLEX MICROSCOPIC
Bacteria, UA: NONE SEEN /HPF
Bilirubin Urine: NEGATIVE
GLUCOSE, UA: NEGATIVE
HGB URINE DIPSTICK: NEGATIVE
HYALINE CAST: NONE SEEN /LPF
KETONES UR: NEGATIVE
NITRITE: NEGATIVE
PH: 6 (ref 5.0–8.0)
PROTEIN: NEGATIVE
RBC / HPF: NONE SEEN /HPF (ref 0–2)
Specific Gravity, Urine: 1.02 (ref 1.001–1.03)

## 2018-01-01 LAB — COMPLETE METABOLIC PANEL WITH GFR
AG Ratio: 1.9 (calc) (ref 1.0–2.5)
ALKALINE PHOSPHATASE (APISO): 77 U/L (ref 33–130)
ALT: 38 U/L — ABNORMAL HIGH (ref 6–29)
AST: 32 U/L (ref 10–35)
Albumin: 4.5 g/dL (ref 3.6–5.1)
BUN: 16 mg/dL (ref 7–25)
CO2: 33 mmol/L — AB (ref 20–32)
CREATININE: 0.78 mg/dL (ref 0.50–0.99)
Calcium: 9.4 mg/dL (ref 8.6–10.4)
Chloride: 100 mmol/L (ref 98–110)
GFR, Est African American: 94 mL/min/{1.73_m2} (ref >=60)
GFR, Est Non African American: 81 mL/min/{1.73_m2} (ref >=60)
GLUCOSE: 90 mg/dL (ref 65–99)
Globulin: 2.4 g/dL (calc) (ref 1.9–3.7)
Potassium: 4 mmol/L (ref 3.5–5.3)
SODIUM: 139 mmol/L (ref 135–146)
Total Bilirubin: 0.4 mg/dL (ref 0.2–1.2)
Total Protein: 6.9 g/dL (ref 6.1–8.1)

## 2018-01-01 LAB — CBC WITH DIFFERENTIAL/PLATELET
BASOS PCT: 0.6 %
Basophils Absolute: 28 cells/uL (ref 0–200)
EOS PCT: 2.1 %
Eosinophils Absolute: 99 cells/uL (ref 15–500)
HCT: 40.8 % (ref 35.0–45.0)
Hemoglobin: 14 g/dL (ref 11.7–15.5)
Lymphs Abs: 1584 cells/uL (ref 850–3900)
MCH: 30.3 pg (ref 27.0–33.0)
MCHC: 34.3 g/dL (ref 32.0–36.0)
MCV: 88.3 fL (ref 80.0–100.0)
MONOS PCT: 11.3 %
MPV: 11 fL (ref 7.5–12.5)
Neutro Abs: 2458 cells/uL (ref 1500–7800)
Neutrophils Relative %: 52.3 %
PLATELETS: 315 10*3/uL (ref 140–400)
RBC: 4.62 10*6/uL (ref 3.80–5.10)
RDW: 13.8 % (ref 11.0–15.0)
TOTAL LYMPHOCYTE: 33.7 %
WBC mixed population: 531 cells/uL (ref 200–950)
WBC: 4.7 10*3/uL (ref 3.8–10.8)

## 2018-01-01 LAB — C3 AND C4
C3 Complement: 166 mg/dL (ref 83–193)
C4 Complement: 31 mg/dL (ref 15–57)

## 2018-01-01 LAB — ANTI-DNA ANTIBODY, DOUBLE-STRANDED: ds DNA Ab: <1 IU/mL

## 2018-01-01 LAB — VITAMIN D 25 HYDROXY (VIT D DEFICIENCY, FRACTURES): Vit D, 25-Hydroxy: 69 ng/mL (ref 30–100)

## 2018-01-01 LAB — SEDIMENTATION RATE: SED RATE: 9 mm/h (ref 0–30)

## 2018-01-04 NOTE — Progress Notes (Signed)
Labs are stable.

## 2018-02-01 ENCOUNTER — Encounter: Payer: Self-pay | Admitting: Neurology

## 2018-02-01 ENCOUNTER — Other Ambulatory Visit: Payer: Self-pay

## 2018-02-01 ENCOUNTER — Ambulatory Visit (INDEPENDENT_AMBULATORY_CARE_PROVIDER_SITE_OTHER): Payer: BLUE CROSS/BLUE SHIELD | Admitting: Neurology

## 2018-02-01 VITALS — BP 110/64 | HR 83 | Ht 60.0 in | Wt 177.0 lb

## 2018-02-01 DIAGNOSIS — R413 Other amnesia: Secondary | ICD-10-CM | POA: Diagnosis not present
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
## 2018-02-01 NOTE — Patient Instructions (Signed)
1. Schedule Neurocognitive testing with Dr. Si Raider 2. Follow-up after memory testing 3. Call our office if you would like to proceed with counseling  RECOMMENDATIONS FOR ALL PATIENTS WITH MEMORY PROBLEMS: 1. Continue to exercise (Recommend 30 minutes of walking everyday, or 3 hours every week) 2. Increase social interactions - continue going to [place] and enjoy social gatherings with friends and family 3. Eat healthy, avoid fried foods and eat more fruits and vegetables 4. Maintain adequate blood pressure, blood sugar, and blood cholesterol level. Reducing the risk of stroke and cardiovascular disease also helps promoting better memory. 5. Avoid stressful situations. Live a simple life and avoid aggravations. Organize your time and prepare for the next day in anticipation. 6. Sleep well, avoid any interruptions of sleep and avoid any distractions in the bedroom that may interfere with adequate sleep quality 7. Avoid sugar, avoid sweets as there is a strong link between excessive sugar intake, diabetes, and cognitive impairment We discussed the Mediterranean diet, which has been shown to help patients reduce the risk of progressive memory disorders and reduces cardiovascular risk. This includes eating fish, eat fruits and green leafy vegetables, nuts like almonds and hazelnuts, walnuts, and also use olive oil. Avoid fast foods and fried foods as much as possible. Avoid sweets and sugar as sugar use has been linked to worsening of memory function.

## 2018-02-19 ENCOUNTER — Other Ambulatory Visit: Payer: Self-pay | Admitting: Rheumatology

## 2018-02-22 NOTE — Telephone Encounter (Signed)
Last Visit: 12/01/17  next Visit: 05/12/18 Labs: 12/31/17 stable  Okay to refill per Dr. Estanislado Pandy

## 2018-04-03 ENCOUNTER — Other Ambulatory Visit: Payer: Self-pay | Admitting: Rheumatology

## 2018-04-05 NOTE — Telephone Encounter (Signed)
Last Visit: 12/01/17 next Visit: 05/12/18  Okay to refill per Dr. Estanislado Pandy

## 2018-04-06 ENCOUNTER — Ambulatory Visit: Payer: BLUE CROSS/BLUE SHIELD | Admitting: Neurology

## 2018-04-07 ENCOUNTER — Ambulatory Visit: Payer: BLUE CROSS/BLUE SHIELD | Admitting: Neurology

## 2018-04-07 ENCOUNTER — Encounter

## 2018-04-08 ENCOUNTER — Other Ambulatory Visit: Payer: Self-pay | Admitting: Obstetrics and Gynecology

## 2018-04-08 DIAGNOSIS — Z1231 Encounter for screening mammogram for malignant neoplasm of breast: Secondary | ICD-10-CM

## 2018-04-29 NOTE — Progress Notes (Signed)
Office Visit Note  Patient: Deborah Levy             Date of Birth: 02/11/1956           MRN: [id number]             PCP: Serita Grit, PA-C Referring: Serita Grit, PA* Visit Date: 05/12/2018 Occupation: @GUAROCC @    Subjective:  Generalized muscle aches    History of Present Illness: Deborah Levy is a 62 y.o. female  With history of systemic lupus erythematosus and fibromyalgia.  She is injecting MTX 0.4 ml once weekly and folic acid 1 mg daily.  She feels most of her pain is coming from fibromyalgia. She denies any recent lupus rashes. She has chronic fatigue.  She denies any recent fevers.  She continues to have chronic hair loss.  She denies any rashes or photosensitivity.  She denies any joint pain or joint swelling. She denies any sores in mouth or nose.  She experiences discomfort in bilateral shoulder joints, especially when laying on her side at night.  She reports she has pain with ROM.    Activities of Daily Living:  Patient reports morning stiffness for 1  hour.   Patient Reports nocturnal pain.  Difficulty dressing/grooming: Denies Difficulty climbing stairs: Denies Difficulty getting out of chair: Reports Difficulty using hands for taps, buttons, cutlery, and/or writing: Denies   Review of Systems  Constitutional: Positive for fatigue.  HENT: Positive for mouth dryness. Negative for mouth sores and nose dryness.   Eyes: Positive for dryness. Negative for pain and visual disturbance.  Respiratory: Negative for cough, hemoptysis, shortness of breath and difficulty breathing.   Cardiovascular: Negative for chest pain, palpitations, hypertension and swelling in legs/feet.  Gastrointestinal: Positive for constipation. Negative for blood in stool and diarrhea.  Endocrine: Negative for increased urination.  Genitourinary: Negative for painful urination.  Musculoskeletal: Positive for arthralgias, joint pain, myalgias, muscle tenderness and myalgias.  Negative for joint swelling, muscle weakness and morning stiffness.  Skin: Positive for hair loss. Negative for color change, pallor, rash, nodules/bumps, skin tightness, ulcers and sensitivity to sunlight.  Allergic/Immunologic: Negative for susceptible to infections.  Neurological: Negative for dizziness, numbness, headaches and weakness.  Hematological: Negative for swollen glands.  Psychiatric/Behavioral: Positive for depressed mood. Negative for sleep disturbance. The patient is not nervous/anxious.     PMFS History:  Patient Active Problem List   Diagnosis Date Noted  . 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 History of gastroesophageal reflux (GERD) 07/01/2017  . Other fatigue 03/23/2017  . Primary insomnia 03/23/2017  . History of vitamin D deficiency 03/23/2017  . High risk medication use 10/18/2016  . 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 Type 2 diabetes mellitus ([place]) 11/22/2014  . Chronic pain 11/13/2014  . Fibromyalgia 11/13/2014  . Rotator cuff impingement syndrome 11/13/2014  . Cannot sleep 02/13/2014  . Essential (primary) hypertension 09/07/2013  . HLD (hyperlipidemia) 09/07/2013  . Adiposity 09/07/2013  . Systemic lupus erythematosus ([place]) 05/21/2013  . Hypokalemia 04/21/2013  . Breast cancer of lower-inner quadrant of left female breast ([place]) 01/12/2012    Past Medical History:  Diagnosis Date  . Breast cancer ([place])   . Cancer ([place])   . Fibromyalgia   . GERD (gastroesophageal reflux disease)   . Hypertension   . Lupus ([place])     Family History  Problem Relation Age of Onset  . Cancer Father        colon  .  Hypertension Father   . Heart disease Father   . Cancer Sister        Cervical  . Cancer Brother        prostate   . Heart disease Maternal Grandfather   . Hypertension Paternal Grandmother   . Heart disease Paternal Grandmother    Past Surgical History:  Procedure Laterality Date  . BREAST LUMPECTOMY Left   .  BREAST SURGERY  03/10/2011   Lt br lumpectomy  . COLONOSCOPY     Removed 2 polpys   . KNEE SURGERY    . SHOULDER SURGERY    . TUBAL LIGATION     Social History   Social History Narrative  . Not on file     Objective: Vital Signs: BP (!) 144/82 (BP Location: Left Arm, Patient Position: Sitting, Cuff Size: Normal)   Pulse 75   Resp 15   Ht 4' 11.5" (1.511 m)   Wt 180 lb (81.6 kg)   BMI 35.75 kg/m    Physical Exam  Constitutional: She is oriented to person, place, and time. She appears well-developed and well-nourished.  HENT:  Head: Normocephalic and atraumatic.  No oral or nasal ulcerations.  Eyes: Conjunctivae and EOM are normal.  Neck: Normal range of motion.  Cardiovascular: Normal rate, regular rhythm, normal heart sounds and intact distal pulses.  Pulmonary/Chest: Effort normal and breath sounds normal.  Abdominal: Soft. Bowel sounds are normal.  Lymphadenopathy:    She has no cervical adenopathy.  Neurological: She is alert and oriented to person, place, and time.  Skin: Skin is warm and dry. Capillary refill takes less than 2 seconds.  Psychiatric: She has a normal mood and affect. Her behavior is normal.  Nursing note and vitals reviewed.    Musculoskeletal Exam: C-spine, thoracic spine, and lumbar spine good ROM.  No midline spinal tenderness.  No SI joint tenderness.  Shoulder joints, elbow joints, wrist joints, MCPs, PIPs, and DIPs good ROM with no synovitis.  Hip joints, knee joints, ankle joints, MTPs, PIPs, and DIPs good ROM with no synovitis.  No warmth or effusion of knee joints.  No tenderness of trochanteric bursa.   CDAI Exam: No CDAI exam completed.    Investigation: No additional findings. CBC Latest Ref Rng & Units 12/31/2017 09/21/2017 07/01/2017  WBC 3.8 - 10.8 Thousand/uL 4.7 3.0(L) 5.4  Hemoglobin 11.7 - 15.5 g/dL 14.0 12.9 13.6  Hematocrit 35.0 - 45.0 % 40.8 38.3 40.2  Platelets 140 - 400 Thousand/uL 315 249 314   CMP Latest Ref Rng &  Units 12/31/2017 09/21/2017 07/01/2017  Glucose 65 - 99 mg/dL 90 139(H) 93  BUN 7 - 25 mg/dL 16 16 15   Creatinine 0.50 - 0.99 mg/dL 0.78 0.87 0.90  Sodium 135 - 146 mmol/L 139 140 142  Potassium 3.5 - 5.3 mmol/L 4.0 4.7 4.5  Chloride 98 - 110 mmol/L 100 104 104  CO2 20 - 32 mmol/L 33(H) 30 30  Calcium 8.6 - 10.4 mg/dL 9.4 8.9 9.4  Total Protein 6.1 - 8.1 g/dL 6.9 6.3 6.7  Total Bilirubin 0.2 - 1.2 mg/dL 0.4 0.4 0.4  Alkaline Phos 33 - 130 U/L - - 86  AST 10 - 35 U/L 32 36(H) 38(H)  ALT 6 - 29 U/L 38(H) 41(H) 41(H)    Imaging: No results found.  Speciality Comments: No specialty comments available.    Procedures:  No procedures performed Allergies: Sulfa antibiotics; Metformin; and Sulfasalazine   Assessment / Plan:     Visit Diagnoses:  Other systemic lupus erythematosus with other organ involvement (HCC) - Fatigue, arthralgia, malar rash, photosensitivity, positive subcutaneous lupus on biopsy, Positive ANA, Ro+ and La +: She has not had any recent lupus flares.  She has no active synovitis on exam today.  She has no cervical lymphadenopathy.  She has no oral or nasal ulcerations on exam today.  She continues to have sicca symptoms.  She has no malar rash or cutaneous lupus lesions.  She denies any photosensitivity.  She was advised to wear sunscreen on a daily basis and reapply every 2 hours when she is in the sun.  We discussed the importance of sun protection.  We also discussed discontinuing the use of omeprazole.  We recommended trying Zantac OTC.  Over time she is tapered down her methotrexate 0.8 mL once weekly to 0.4 mL weekly.  She would like to hold off on methotrexate until her next visit and see how she does.  We discussed the potential risks of discontinuing methotrexate.  We will re-check her autoimmune labs at her next visit.  She was advised to notify us if she develops cutaneous lupus or any increased joint pain or joint swelling in the meantime.  Cutaneous lupus  erythematosus:  She has no cutaneous lupus lesions.  No malar rash noted.  She is advised to notify us that she does develop a rash after discontinue methotrexate.  She was advised to discontinue taking omeprazole and we recommended trying Zantac.   High risk medication use -She is going to discontinue methotrexate and folic acid.  We will reassess at her next visit.  We will repeat autoimmune labs at that time.    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  Fibromyalgia: She continues to have generalized muscle aches and muscle tenderness.  She feels that most of her muscle aches and joint pain is due to fibromyalgia.  She continues to have chronic fatigue and insomnia.  She takes Ambien 10 mg at bedtime for insomnia.  She is encouraged to exercise on a regular basis.  Primary insomnia: She takes Ambien 10 mg at bedtime which helps with her insomnia.  Other fatigue: Chronic.  History of vitamin D deficiency: She is on a calcium and vitamin D supplement that she takes twice daily.  Other medical conditions are listed as follows:   History of gastroesophageal reflux (GERD)  History of breast cancer  History of diabetes mellitus  History of hypertension  History of depression  History of hyperlipidemia    Orders: No orders of the defined types were placed in this encounter.  No orders of the defined types were placed in this encounter.    Follow-Up Instructions: Return in about 5 months (around 10/12/2018), or if symptoms worsen or fail to improve, for Systemic lupus erythematosus, Fibromyalgia.   Ofilia Neas, PA-C   I examined and evaluated the patient with Hazel Sams PA.  Patient had no synovitis on examination today.  She wants to discontinue methotrexate.  We had detailed discussion regarding that.  I made her aware that it is possible that she may have recurrence of her cutaneous lupus.  The plan of care was discussed as noted above.  Bo Merino, MD  Note - This record has been created using Editor, commissioning.  Chart creation errors have been sought, but may not always  have been located. Such creation errors do not reflect on  the standard of medical care.

## 2018-05-12 ENCOUNTER — Ambulatory Visit (INDEPENDENT_AMBULATORY_CARE_PROVIDER_SITE_OTHER): Payer: BLUE CROSS/BLUE SHIELD | Admitting: Rheumatology

## 2018-05-12 ENCOUNTER — Encounter: Payer: Self-pay | Admitting: Rheumatology

## 2018-05-12 ENCOUNTER — Encounter (INDEPENDENT_AMBULATORY_CARE_PROVIDER_SITE_OTHER): Payer: Self-pay

## 2018-05-12 VITALS — BP 144/82 | HR 75 | Resp 15 | Ht 59.5 in | Wt 180.0 lb

## 2018-05-12 DIAGNOSIS — Z8639 Personal history of other endocrine, nutritional and metabolic disease: Secondary | ICD-10-CM

## 2018-05-12 DIAGNOSIS — Z8679 Personal history of other diseases of the circulatory system: Secondary | ICD-10-CM | POA: Diagnosis not present

## 2018-05-12 DIAGNOSIS — L932 Other local lupus erythematosus: Secondary | ICD-10-CM

## 2018-05-12 DIAGNOSIS — Z853 Personal history of malignant neoplasm of breast: Secondary | ICD-10-CM

## 2018-05-12 DIAGNOSIS — Z79899 Other long term (current) drug therapy: Secondary | ICD-10-CM

## 2018-05-12 DIAGNOSIS — Z8719 Personal history of other diseases of the digestive system: Secondary | ICD-10-CM | POA: Diagnosis not present

## 2018-05-12 DIAGNOSIS — R5383 Other fatigue: Secondary | ICD-10-CM | POA: Diagnosis not present

## 2018-05-12 DIAGNOSIS — M3219 Other organ or system involvement in systemic lupus erythematosus: Secondary | ICD-10-CM

## 2018-05-12 DIAGNOSIS — M797 Fibromyalgia: Secondary | ICD-10-CM | POA: Diagnosis not present

## 2018-05-12 DIAGNOSIS — H35389 Toxic maculopathy, unspecified eye: Secondary | ICD-10-CM

## 2018-05-12 DIAGNOSIS — Z8659 Personal history of other mental and behavioral disorders: Secondary | ICD-10-CM

## 2018-05-12 DIAGNOSIS — T372X5A Adverse effect of antimalarials and drugs acting on other blood protozoa, initial encounter: Secondary | ICD-10-CM

## 2018-05-12 DIAGNOSIS — F5101 Primary insomnia: Secondary | ICD-10-CM | POA: Diagnosis not present

## 2018-05-13 ENCOUNTER — Ambulatory Visit
Admission: RE | Admit: 2018-05-13 | Discharge: 2018-05-13 | Disposition: A | Discharge status: Home / Self Care | Payer: BLUE CROSS/BLUE SHIELD | Source: Ambulatory Visit | Attending: Obstetrics and Gynecology | Admitting: Obstetrics and Gynecology

## 2018-05-13 DIAGNOSIS — Z1231 Encounter for screening mammogram for malignant neoplasm of breast: Secondary | ICD-10-CM

## 2018-05-19 ENCOUNTER — Ambulatory Visit: Payer: BLUE CROSS/BLUE SHIELD | Admitting: Rheumatology

## 2018-05-26 ENCOUNTER — Telehealth: Payer: Self-pay | Admitting: Rheumatology

## 2018-05-26 NOTE — Telephone Encounter (Signed)
Patient did not take MTX this weekend as normal due to doing better. Per patient tip of nose is tingling, and cold feeling. Patient started noticing this Monday night. ? If this is related to not taking MTX. Please call to advise.

## 2018-05-27 MED ORDER — ONDANSETRON HCL 4 MG PO TABS: ORAL_TABLET | ORAL | 2 refills | Status: DC

## 2018-05-27 MED ORDER — METHOTREXATE SODIUM CHEMO INJECTION 50 MG/2ML: 10.0000 mg | INTRAMUSCULAR | 0 refills | Status: DC

## 2018-05-27 NOTE — Telephone Encounter (Signed)
Patient advised that the sensation that she experienced was more than likely not from the MTX.   Last Visit: 05/12/18 Next visit: 10/12/18 Labs: 12/31/17 stable  Patient will come to update labs this week  Okay to refill per Dr. Estanislado Pandy

## 2018-06-04 ENCOUNTER — Other Ambulatory Visit: Payer: Self-pay | Admitting: *Deleted

## 2018-06-04 MED ORDER — FOLIC ACID 1 MG PO TABS: 2.0000 mg | ORAL_TABLET | Freq: Every day | ORAL | 4 refills | Status: DC

## 2018-06-04 NOTE — Telephone Encounter (Signed)
Refill request received via fax   Last Visit: 05/12/18 Next visit: 10/12/18  Okay to refill per Dr. Estanislado Pandy

## 2018-06-22 ENCOUNTER — Other Ambulatory Visit: Payer: Self-pay | Admitting: Rheumatology

## 2018-06-22 ENCOUNTER — Ambulatory Visit (INDEPENDENT_AMBULATORY_CARE_PROVIDER_SITE_OTHER): Payer: BLUE CROSS/BLUE SHIELD | Admitting: Psychology

## 2018-06-22 ENCOUNTER — Encounter: Payer: Self-pay | Admitting: Psychology

## 2018-06-22 DIAGNOSIS — R413 Other amnesia: Secondary | ICD-10-CM | POA: Diagnosis not present

## 2018-06-22 DIAGNOSIS — Z79899 Other long term (current) drug therapy: Secondary | ICD-10-CM
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
## 2018-06-22 NOTE — Telephone Encounter (Addendum)
Last Visit: 05/12/18 Next visit: 10/12/18 Labs: 12/31/17 stable  Patient advised she is due to update labs and will update today.

## 2018-06-23 ENCOUNTER — Encounter: Payer: Self-pay | Admitting: Psychology

## 2018-06-23 ENCOUNTER — Ambulatory Visit: Payer: BLUE CROSS/BLUE SHIELD | Admitting: Psychology

## 2018-06-23 DIAGNOSIS — R413 Other amnesia: Secondary | ICD-10-CM

## 2018-06-23 NOTE — Progress Notes (Signed)
   Neuropsychology Note  Deborah Levy completed 120 minutes of neuropsychological testing with technician, Milana Kidney, BS, under the supervision of Dr. Macarthur Critchley, Licensed Psychologist. The patient did not appear overtly distressed by the testing session, per behavioral observation or via self-report to the technician. Rest breaks were offered.   Clinical Decision Making: In considering the patient's current level of functioning, level of presumed impairment, nature of symptoms, emotional and behavioral responses during the interview, level of literacy, and observed level of motivation/effort, a battery of tests was selected and communicated to the psychometrician.  Communication between the psychologist and technician was ongoing throughout the testing session and changes were made as deemed necessary based on patient performance on testing, technician observations and additional pertinent factors such as those listed above.  Deborah ANN K Molloy will return within approximately 2 weeks for an interactive feedback session with Dr. Si Raider at which time her test performances, clinical impressions and treatment recommendations will be reviewed in detail. The patient understands she can contact our office should she require our assistance before this time.  35 minutes spent performing neuropsychological evaluation services/clinical decision making (psychologist). [CPT 89169] 120 minutes spent face-to-face with patient administering standardized tests, 30 minutes spent scoring (technician). [CPT Y8200648, 45038]  Full report to follow.

## 2018-06-25 ENCOUNTER — Other Ambulatory Visit: Payer: Self-pay

## 2018-06-25 DIAGNOSIS — Z79899 Other long term (current) drug therapy: Secondary | ICD-10-CM

## 2018-06-25 LAB — COMPLETE METABOLIC PANEL WITH GFR
AG Ratio: 1.6 (calc) (ref 1.0–2.5)
ALBUMIN MSPROF: 4.1 g/dL (ref 3.6–5.1)
ALT: 28 U/L (ref 6–29)
AST: 26 U/L (ref 10–35)
Alkaline phosphatase (APISO): 71 U/L (ref 33–130)
BUN: 13 mg/dL (ref 7–25)
CALCIUM: 8.9 mg/dL (ref 8.6–10.4)
CHLORIDE: 98 mmol/L (ref 98–110)
CO2: 32 mmol/L (ref 20–32)
Creat: 0.78 mg/dL (ref 0.50–0.99)
GFR, Est African American: 93 mL/min/{1.73_m2} (ref >=60)
GFR, Est Non African American: 80 mL/min/{1.73_m2} (ref >=60)
Globulin: 2.5 g/dL (calc) (ref 1.9–3.7)
Glucose, Bld: 130 mg/dL — ABNORMAL HIGH (ref 65–99)
POTASSIUM: 3.3 mmol/L — AB (ref 3.5–5.3)
Sodium: 139 mmol/L (ref 135–146)
Total Bilirubin: 0.4 mg/dL (ref 0.2–1.2)
Total Protein: 6.6 g/dL (ref 6.1–8.1)

## 2018-06-25 LAB — CBC WITH DIFFERENTIAL/PLATELET
BASOS PCT: 1 %
Basophils Absolute: 41 cells/uL (ref 0–200)
EOS ABS: 70 {cells}/uL (ref 15–500)
EOS PCT: 1.7 %
HCT: 39.5 % (ref 35.0–45.0)
Hemoglobin: 13.8 g/dL (ref 11.7–15.5)
Lymphs Abs: 1566 cells/uL (ref 850–3900)
MCH: 30.7 pg (ref 27.0–33.0)
MCHC: 34.9 g/dL (ref 32.0–36.0)
MCV: 87.8 fL (ref 80.0–100.0)
MONOS PCT: 10.4 %
MPV: 10.7 fL (ref 7.5–12.5)
Neutro Abs: 1997 cells/uL (ref 1500–7800)
Neutrophils Relative %: 48.7 %
PLATELETS: 250 10*3/uL (ref 140–400)
RBC: 4.5 10*6/uL (ref 3.80–5.10)
RDW: 12.7 % (ref 11.0–15.0)
TOTAL LYMPHOCYTE: 38.2 %
WBC mixed population: 426 cells/uL (ref 200–950)
WBC: 4.1 10*3/uL (ref 3.8–10.8)

## 2018-06-25 NOTE — Telephone Encounter (Signed)
Patient updates labs 06/25/18.  Okay to refill per Dr. Estanislado Pandy

## 2018-07-19 ENCOUNTER — Telehealth: Payer: Self-pay | Admitting: Neurology

## 2018-07-19 NOTE — Telephone Encounter (Signed)
error 

## 2018-07-20 ENCOUNTER — Encounter: Payer: BLUE CROSS/BLUE SHIELD | Admitting: Psychology
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
## 2018-07-22 ENCOUNTER — Encounter: Payer: Self-pay | Admitting: Psychology

## 2018-07-22 ENCOUNTER — Ambulatory Visit (INDEPENDENT_AMBULATORY_CARE_PROVIDER_SITE_OTHER): Payer: BLUE CROSS/BLUE SHIELD | Admitting: Psychology

## 2018-07-22 DIAGNOSIS — F419 Anxiety disorder, unspecified: Secondary | ICD-10-CM

## 2018-07-22 DIAGNOSIS — R413 Other amnesia: Secondary | ICD-10-CM

## 2018-07-22 DIAGNOSIS — F331 Major depressive disorder, recurrent, moderate: Secondary | ICD-10-CM
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
## 2018-08-03 ENCOUNTER — Ambulatory Visit: Payer: BLUE CROSS/BLUE SHIELD | Admitting: Neurology

## 2018-08-30 ENCOUNTER — Telehealth: Payer: Self-pay | Admitting: Rheumatology

## 2018-08-30 NOTE — Telephone Encounter (Signed)
She should get medication from her PCP.

## 2018-08-30 NOTE — Telephone Encounter (Signed)
Advised patient to contact her PCP about medication and patient verbalized understanding.

## 2018-08-30 NOTE — Telephone Encounter (Signed)
Patient called stating she saw her pain management doctor (couldn't remember her name) 3 weeks ago who prescribed a medication that "starts with a T" to help her sleep, but it isn't working.  Patient states she requested a prescription of Ambien, but the doctor told her that she doesn't prescribe that medication.  Patient is asking if Dr. Estanislado Pandy could send in a prescription of Ambien to [facility] on [address].  Patient states she is up all night and needs help sleeping.

## 2018-09-19 ENCOUNTER — Other Ambulatory Visit: Payer: Self-pay | Admitting: Rheumatology

## 2018-09-20 NOTE — Telephone Encounter (Addendum)
Last Visit: 05/12/18 Next visit: 10/12/18 Labs: 06/25/18 Glucose is 130. Potassium is low. All other labs are WNL  Okay to refill per Dr. Estanislado Pandy

## 2018-09-28 NOTE — Progress Notes (Signed)
Office Visit Note  Patient: Deborah Levy             Date of Birth: 13-Oct-1956           MRN: [id number]             PCP: Serita Grit, PA-C Referring: Serita Grit, PA* Visit Date: 10/12/2018 Occupation: @GUAROCC @  Subjective:  Left knee pain   History of Present Illness: Deborah Levy is a 62 y.o. female with history of systemic lupus erythematosus and fibromyalgia.  She continues to inject methotrexate 0.4 mL subcutaneously once weekly and takes folic acid 1 mg by mouth daily.  She denies any recent lupus flares.  She continues to have generalized muscle aches and muscle tenderness due to fibromyalgia. She has bilateral trochanteric bursitis.  She has chronic fatigue and insomnia.  She has noticed significant benefit since switching from trazodone to Ambien 10 mg at bedtime. She reports that she has been having increased pain in the left knee joint.  She denies any joint swelling or warmth.  She denies any injury or mechanical symptoms.  She has been alternating heat and ice as well as using topical creams.  She reports that she is also been having discomfort in her right CMC joint.  She has been wearing a right CMC joint brace for the past 1 week.  She denies any recent rashes but continues to have photosensitivity.  She denies any symptoms of Raynolds.  She continues to have eye dryness but denies any mouth dryness.  Denies any sores in her mouth or nose.  She denies hair loss.     Activities of Daily Living:  Patient reports morning stiffness for 4  hours.   Patient Reports nocturnal pain.  Difficulty dressing/grooming: Denies Difficulty climbing stairs: Denies Difficulty getting out of chair: Reports Difficulty using hands for taps, buttons, cutlery, and/or writing: Denies  Review of Systems  Constitutional: Positive for fatigue.  HENT: Negative for mouth sores, mouth dryness and nose dryness.   Eyes: Positive for dryness. Negative for pain and visual  disturbance.  Respiratory: Negative for cough, hemoptysis, shortness of breath and difficulty breathing.   Cardiovascular: Negative for chest pain, palpitations, hypertension and swelling in legs/feet.  Gastrointestinal: Negative for blood in stool, constipation and diarrhea.  Endocrine: Negative for increased urination.  Genitourinary: Negative for painful urination.  Musculoskeletal: Positive for arthralgias, joint pain, myalgias, morning stiffness, muscle tenderness and myalgias. Negative for joint swelling and muscle weakness.  Skin: Positive for sensitivity to sunlight. Negative for color change, pallor, rash, hair loss, nodules/bumps, skin tightness and ulcers.  Allergic/Immunologic: Negative for susceptible to infections.  Neurological: Negative for dizziness, numbness, headaches and weakness.  Hematological: Negative for swollen glands.  Psychiatric/Behavioral: Positive for sleep disturbance. Negative for depressed mood. The patient is not nervous/anxious.     PMFS History:  Patient Active Problem List   Diagnosis Date Noted  . 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 History of gastroesophageal reflux (GERD) 07/01/2017  . Other fatigue 03/23/2017  . Primary insomnia 03/23/2017  . History of vitamin D deficiency 03/23/2017  . High risk medication use 10/18/2016  . 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 Type 2 diabetes mellitus ([place]) 11/22/2014  . Chronic pain 11/13/2014  . Fibromyalgia 11/13/2014  . Rotator cuff impingement syndrome 11/13/2014  . Cannot sleep 02/13/2014  . Essential (primary) hypertension 09/07/2013  . HLD (hyperlipidemia) 09/07/2013  . Adiposity 09/07/2013  . Systemic lupus erythematosus (  [place]) 05/21/2013  . Hypokalemia 04/21/2013  . Breast cancer of lower-inner quadrant of left female breast ([place]) 01/12/2012    Past Medical History:  Diagnosis Date  . Breast cancer ([place])   . Cancer ([place])   . Fibromyalgia   . GERD  (gastroesophageal reflux disease)   . Hypertension   . Lupus ([place])     Family History  Problem Relation Age of Onset  . Cancer Father        colon  . Hypertension Father   . Heart disease Father   . Cancer Sister        Cervical  . Cancer Brother        prostate   . Heart disease Maternal Grandfather   . Hypertension Paternal Grandmother   . Heart disease Paternal Grandmother    Past Surgical History:  Procedure Laterality Date  . BREAST LUMPECTOMY Left   . BREAST SURGERY  03/10/2011   Lt br lumpectomy  . COLONOSCOPY     Removed 2 polpys   . KNEE SURGERY    . SHOULDER SURGERY    . TUBAL LIGATION     Social History   Social History Narrative  . Not on file    Objective: Vital Signs: BP 128/86 (BP Location: Right Arm, Patient Position: Sitting, Cuff Size: Normal)   Pulse 86   Resp 13   Ht 5' (1.524 m)   Wt 180 lb (81.6 kg)   BMI 35.15 kg/m    Physical Exam  Constitutional: She is oriented to person, place, and time. She appears well-developed and well-nourished.  HENT:  Head: Normocephalic and atraumatic.  No oral or nasal ulcerations.  No parotid swelling.  Eyes: Conjunctivae and EOM are normal.  Neck: Normal range of motion.  Cardiovascular: Normal rate, regular rhythm, normal heart sounds and intact distal pulses.  Pulmonary/Chest: Effort normal and breath sounds normal.  Abdominal: Soft. Bowel sounds are normal.  Lymphadenopathy:    She has no cervical adenopathy.  Neurological: She is alert and oriented to person, place, and time.  Skin: Skin is warm and dry. Capillary refill takes less than 2 seconds.  No malar rash. No digital ulcerations or signs of gangrene noted.   Psychiatric: She has a normal mood and affect. Her behavior is normal.  Nursing note and vitals reviewed.    Musculoskeletal Exam: C-spine, thoracic spine, lumbar spine good range of motion.  No midline spinal tenderness.  No SI joint tenderness.  Shoulder joints, elbow joints, wrist  joints, MCPs and PIPs and DIPs good range of motion no synovitis.  She has right CMC joint tenderness.  Hip joints good range of motion no discomfort.  She has bilateral knee crepitus.  No warmth or effusion noted.  No tenderness or swelling of ankle joints.  CDAI Exam: CDAI Score: Not documented Patient Global Assessment: Not documented; Provider Global Assessment: Not documented Swollen: Not documented; Tender: Not documented Joint Exam   Not documented   There is currently no information documented on the homunculus. Go to the Rheumatology activity and complete the homunculus joint exam.  Investigation: No additional findings.  Imaging: Xr Knee 3 View Left  Result Date: 10/12/2018 Mild medial compartment narrowing was noted.  Moderate patellofemoral narrowing was noted.  No chondrocalcinosis was noted. Impression: These findings are consistent with mild osteoarthritis and moderate chondromalacia patella.   Recent Labs: Lab Results  Component Value Date   WBC 4.1 06/25/2018   HGB 13.8 06/25/2018   PLT 250 06/25/2018  NA 139 06/25/2018   K 3.3 (L) 06/25/2018   CL 98 06/25/2018   CO2 32 06/25/2018   GLUCOSE 130 (H) 06/25/2018   BUN 13 06/25/2018   CREATININE 0.78 06/25/2018   BILITOT 0.4 06/25/2018   ALKPHOS 86 07/01/2017   AST 26 06/25/2018   ALT 28 06/25/2018   PROT 6.6 06/25/2018   ALBUMIN 4.3 07/01/2017   CALCIUM 8.9 06/25/2018   GFRAA 93 06/25/2018    Speciality Comments: No specialty comments available.  Procedures:  No procedures performed Allergies: Sulfa antibiotics; Metformin; and Sulfasalazine   Assessment / Plan:     Visit Diagnoses: Other systemic lupus erythematosus with other organ involvement (HCC) - Fatigue, arthralgia, malar rash, photosensitivity, positive subcutaneous lupus on biopsy, Positive ANA, Ro+ and La +: She has not had any recent lupus flares.  She is clinically doing well on MTX 0.4 ml sq once weekly and folic acid 1 mg by mouth  daily.  She has no synovitis on exam.  She has not had any recent rashes.  No malar rash noted on exam.  She continues to have photosensitivity, and she tries to avoid direct sunlight.  She had not had any symptoms of Raynaud's and no digital ulcerations or signs of gangrene were noted.  She has eye dryness but no mouth dryness.  No parotid swelling.  No oral or nasal ulcerations noted on exam.  No cervical lymphadenopathy. She will continue on MTX 0.4 ml sq once weekly and folic acid 1 mg by mouth daily.  We will check autoimmune labs today.  She will follow up in 5 months.  She was advised to notify us if she develops new or worsening symptoms.    - Plan: Urinalysis, Routine w reflex microscopic, Anti-DNA antibody, double-stranded, C3 and C4, Sedimentation rate  Cutaneous lupus erythematosus: She continues to have photosensitivity.  No malar rash or discoid lesions noted.   High risk medication use - MTX 0.4 ml sq once weekly. CBC and CMP were drawn today to monitor for drug toxicity. A refill of syringes was sent to the pharmacy.  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  Fibromyalgia: She continues to have generalized muscle aches and muscle tenderness due to fibromyalgia. She has trapezius muscle tension and muscle tenderness.  She continues to have bilateral trochanteric bursitis. She has chronic fatigue and insomnia.    Other fatigue: Chronic and related to insomnia.   Primary insomnia - She is on Ambien 10 mg by mouth at bedtime.  She has noticed significant benefit switching from trazodone to Ambien 10 mg by mouth at bedtime.  Good sleep hygiene was discussed.   Chronic pain of left knee - She has been having left knee pain over the past several months.  No warmth or effusion noted.  She has good ROM with left knee crepitus.  No mechanical symptoms.  She has tried alternating ice and heat.  X-rays of  the left knee were obtained today.  Findings were consistent with mild osteoarthritis and moderate chondromalacia patella.  She was given a prescription for voltaren gel, which she will apply topically 3 times day PRN.she was also given a handout of knee exercises that she can perform at home.  Plan: XR KNEE 3 VIEW LEFT   Other medical conditions are listed as follows:   History of vitamin D deficiency  History of hyperlipidemia  History of breast cancer  History of diabetes  mellitus  History of gastroesophageal reflux (GERD)  History of depression  History of hypertension    Orders: Orders Placed This Encounter  Procedures  . XR KNEE 3 VIEW LEFT  . CBC with Differential/Platelet  . COMPLETE METABOLIC PANEL WITH GFR  . Urinalysis, Routine w reflex microscopic  . Anti-DNA antibody, double-stranded  . C3 and C4  . Sedimentation rate   Meds ordered this encounter  Medications  . Tuberculin-Allergy Syringes (B-D ALLERGY SYRINGE 1CC/28G) 28G X 1/2" 1 ML MISC    Sig: USE AS DIRECTED TO INJECT ONCE A WEEK    Dispense:  12 each    Refill:  0  . diclofenac sodium (VOLTAREN) 1 % GEL    Sig: 3 grams to 3 large joints up to 3 times daily    Dispense:  3 Tube    Refill:  3    Face-to-face time spent with patient was 30 minutes. Greater than 50% of time was spent in counseling and coordination of care.  Follow-Up Instructions: Return in about 5 months (around 03/13/2019) for Systemic lupus erythematosus, Fibromyalgia, Osteoarthritis.   Hazel Sams PA-C  I examined and evaluated the patient with Hazel Sams PA.  Patient had no synovitis on my examination.  She has had no recurrence of rash being off methotrexate.  She has been having discomfort in her left knee joint.  She had crepitus in her bilateral knee joints.  The x-ray of the left knee joint was consistent with mild osteoarthritis and moderate chondromalacia patella.  Weight loss diet and exercise was discussed.  A handout on  knee joint exercises was given.  I will also call in a prescription for diclofenac gel.  Side effects were discussed at length.  The plan of care was discussed as noted above.  Bo Merino, MD    Note - This record has been created using Editor, commissioning.  Chart creation errors have been sought, but may not always  have been located. Such creation errors do not reflect on  the standard of medical care.

## 2018-10-12 ENCOUNTER — Encounter: Payer: Self-pay | Admitting: Rheumatology

## 2018-10-12 ENCOUNTER — Ambulatory Visit (INDEPENDENT_AMBULATORY_CARE_PROVIDER_SITE_OTHER): Payer: BLUE CROSS/BLUE SHIELD

## 2018-10-12 ENCOUNTER — Ambulatory Visit: Payer: BLUE CROSS/BLUE SHIELD | Admitting: Rheumatology

## 2018-10-12 VITALS — BP 128/86 | HR 86 | Resp 13 | Ht 60.0 in | Wt 180.0 lb

## 2018-10-12 DIAGNOSIS — M25562 Pain in left knee: Secondary | ICD-10-CM

## 2018-10-12 DIAGNOSIS — L932 Other local lupus erythematosus: Secondary | ICD-10-CM | POA: Diagnosis not present

## 2018-10-12 DIAGNOSIS — Z8659 Personal history of other mental and behavioral disorders: Secondary | ICD-10-CM

## 2018-10-12 DIAGNOSIS — R5383 Other fatigue: Secondary | ICD-10-CM

## 2018-10-12 DIAGNOSIS — M3219 Other organ or system involvement in systemic lupus erythematosus: Secondary | ICD-10-CM

## 2018-10-12 DIAGNOSIS — M1712 Unilateral primary osteoarthritis, left knee: Secondary | ICD-10-CM

## 2018-10-12 DIAGNOSIS — H35389 Toxic maculopathy, unspecified eye: Secondary | ICD-10-CM

## 2018-10-12 DIAGNOSIS — M797 Fibromyalgia: Secondary | ICD-10-CM

## 2018-10-12 DIAGNOSIS — Z79899 Other long term (current) drug therapy: Secondary | ICD-10-CM

## 2018-10-12 DIAGNOSIS — G8929 Other chronic pain: Secondary | ICD-10-CM

## 2018-10-12 DIAGNOSIS — Z8639 Personal history of other endocrine, nutritional and metabolic disease: Secondary | ICD-10-CM

## 2018-10-12 DIAGNOSIS — Z853 Personal history of malignant neoplasm of breast: Secondary | ICD-10-CM

## 2018-10-12 DIAGNOSIS — F5101 Primary insomnia: Secondary | ICD-10-CM

## 2018-10-12 DIAGNOSIS — Z8719 Personal history of other diseases of the digestive system: Secondary | ICD-10-CM

## 2018-10-12 DIAGNOSIS — T372X5A Adverse effect of antimalarials and drugs acting on other blood protozoa, initial encounter: Secondary | ICD-10-CM

## 2018-10-12 DIAGNOSIS — Z8679 Personal history of other diseases of the circulatory system: Secondary | ICD-10-CM

## 2018-10-12 MED ORDER — DICLOFENAC SODIUM 1 % TD GEL: TRANSDERMAL | 3 refills | Status: DC

## 2018-10-12 MED ORDER — "TUBERCULIN-ALLERGY SYRINGES 28G X 1/2"" 1 ML MISC": 0 refills | Status: DC

## 2018-10-12 NOTE — Patient Instructions (Signed)

## 2018-10-13 LAB — CBC WITH DIFFERENTIAL/PLATELET
BASOS PCT: 0.7 %
Basophils Absolute: 39 cells/uL (ref 0–200)
EOS PCT: 1.4 %
Eosinophils Absolute: 78 cells/uL (ref 15–500)
HEMATOCRIT: 43.2 % (ref 35.0–45.0)
Hemoglobin: 15 g/dL (ref 11.7–15.5)
LYMPHS ABS: 1870 {cells}/uL (ref 850–3900)
MCH: 31.2 pg (ref 27.0–33.0)
MCHC: 34.7 g/dL (ref 32.0–36.0)
MCV: 89.8 fL (ref 80.0–100.0)
MPV: 10.5 fL (ref 7.5–12.5)
Monocytes Relative: 11.5 %
NEUTROS ABS: 2968 {cells}/uL (ref 1500–7800)
Neutrophils Relative %: 53 %
Platelets: 339 10*3/uL (ref 140–400)
RBC: 4.81 10*6/uL (ref 3.80–5.10)
RDW: 14 % (ref 11.0–15.0)
Total Lymphocyte: 33.4 %
WBC: 5.6 10*3/uL (ref 3.8–10.8)
WBCMIX: 644 {cells}/uL (ref 200–950)

## 2018-10-13 LAB — URINALYSIS, ROUTINE W REFLEX MICROSCOPIC
Bacteria, UA: NONE SEEN /HPF
Bilirubin Urine: NEGATIVE
Glucose, UA: NEGATIVE
Hgb urine dipstick: NEGATIVE
Hyaline Cast: NONE SEEN /LPF
KETONES UR: NEGATIVE
Nitrite: NEGATIVE
PH: 7 (ref 5.0–8.0)
Protein, ur: NEGATIVE
RBC / HPF: NONE SEEN /HPF (ref 0–2)
Specific Gravity, Urine: 1.02 (ref 1.001–1.03)

## 2018-10-13 LAB — COMPLETE METABOLIC PANEL WITH GFR
AG RATIO: 1.5 (calc) (ref 1.0–2.5)
ALT: 36 U/L — AB (ref 6–29)
AST: 32 U/L (ref 10–35)
Albumin: 4.5 g/dL (ref 3.6–5.1)
Alkaline phosphatase (APISO): 76 U/L (ref 33–130)
BILIRUBIN TOTAL: 0.4 mg/dL (ref 0.2–1.2)
BUN: 20 mg/dL (ref 7–25)
CHLORIDE: 100 mmol/L (ref 98–110)
CO2: 33 mmol/L — ABNORMAL HIGH (ref 20–32)
Calcium: 9.3 mg/dL (ref 8.6–10.4)
Creat: 0.74 mg/dL (ref 0.50–0.99)
GFR, EST AFRICAN AMERICAN: 99 mL/min/{1.73_m2} (ref >=60)
GFR, Est Non African American: 86 mL/min/{1.73_m2} (ref >=60)
Globulin: 3 g/dL (calc) (ref 1.9–3.7)
Glucose, Bld: 79 mg/dL (ref 65–99)
POTASSIUM: 3.6 mmol/L (ref 3.5–5.3)
SODIUM: 142 mmol/L (ref 135–146)
TOTAL PROTEIN: 7.5 g/dL (ref 6.1–8.1)

## 2018-10-13 LAB — C3 AND C4
C3 COMPLEMENT: 188 mg/dL (ref 83–193)
C4 COMPLEMENT: 35 mg/dL (ref 15–57)

## 2018-10-13 LAB — ANTI-DNA ANTIBODY, DOUBLE-STRANDED: ds DNA Ab: <1 IU/mL

## 2018-10-13 LAB — SEDIMENTATION RATE: SED RATE: 14 mm/h (ref 0–30)

## 2018-10-13 NOTE — Progress Notes (Signed)
ALT is 36. I spoke with Dr. Estanislado Pandy and she is ok with the patient continuing on MTX 0.4 ml once weekly. CBC WNL. Sed rate WNL. Complements WNL. DsDNA negative. UA stable.

## 2018-10-14 ENCOUNTER — Telehealth: Payer: Self-pay | Admitting: Rheumatology

## 2018-10-14 NOTE — Telephone Encounter (Signed)
Patient left a voicemail stating she was returning your call.   

## 2018-10-14 NOTE — Telephone Encounter (Signed)
ALT is 36. Patient advised to continue on MTX 0.4 ml once weekly. CBC WNL. Sed rate WNL. Complements WNL. DsDNA negative. UA stable. Patient verbalized understanding.

## 2018-11-13 ENCOUNTER — Other Ambulatory Visit: Payer: Self-pay | Admitting: Rheumatology

## 2018-11-15 NOTE — Telephone Encounter (Signed)
last visit: 10/12/18 Next visit: 03/16/19 Labs: 10/12/18 ALT is 36. CBC WNL. Sed rate WNL. Complements WNL. DsDNA negative. UA stable.  Okay to refill per Dr. Estanislado Pandy

## 2018-12-10 ENCOUNTER — Other Ambulatory Visit: Payer: Self-pay | Admitting: Rheumatology

## 2018-12-10 NOTE — Telephone Encounter (Signed)
last visit: 10/12/18 Next visit: 03/16/19  Okay to refill per Dr. Estanislado Pandy

## 2019-02-08 ENCOUNTER — Other Ambulatory Visit: Payer: Self-pay | Admitting: Rheumatology

## 2019-02-08 NOTE — Telephone Encounter (Signed)
last visit: 10/12/18 Next visit: 03/16/19 Labs:10/12/18 ALT is 36. CBC WNL.  Attempted to contact the patient and left message to advise she is due to update labs   Okay to refill 30 day supply per Dr. Estanislado Pandy

## 2019-02-10 ENCOUNTER — Telehealth: Payer: Self-pay | Admitting: Rheumatology

## 2019-02-10 NOTE — Telephone Encounter (Signed)
Patient left voicemail requesting a return call to let her know if she needs to fast for her labwork.

## 2019-02-11 ENCOUNTER — Other Ambulatory Visit: Payer: Self-pay

## 2019-02-11 DIAGNOSIS — Z79899 Other long term (current) drug therapy: Secondary | ICD-10-CM

## 2019-02-11 NOTE — Telephone Encounter (Signed)
Patient advised she does not have to be fasting for labs. PAtient will come by for lab work today.

## 2019-02-12 LAB — CBC WITH DIFFERENTIAL/PLATELET
Absolute Monocytes: 451 cells/uL (ref 200–950)
Basophils Absolute: 32 cells/uL (ref 0–200)
Basophils Relative: 0.7 %
EOS ABS: 92 {cells}/uL (ref 15–500)
Eosinophils Relative: 2 %
HCT: 40.1 % (ref 35.0–45.0)
Hemoglobin: 13.9 g/dL (ref 11.7–15.5)
LYMPHS ABS: 1523 {cells}/uL (ref 850–3900)
MCH: 31.2 pg (ref 27.0–33.0)
MCHC: 34.7 g/dL (ref 32.0–36.0)
MCV: 89.9 fL (ref 80.0–100.0)
MPV: 10.4 fL (ref 7.5–12.5)
Monocytes Relative: 9.8 %
Neutro Abs: 2502 cells/uL (ref 1500–7800)
Neutrophils Relative %: 54.4 %
Platelets: 290 10*3/uL (ref 140–400)
RBC: 4.46 10*6/uL (ref 3.80–5.10)
RDW: 12.9 % (ref 11.0–15.0)
Total Lymphocyte: 33.1 %
WBC: 4.6 10*3/uL (ref 3.8–10.8)

## 2019-02-12 LAB — COMPLETE METABOLIC PANEL WITH GFR
AG Ratio: 1.8 (calc) (ref 1.0–2.5)
ALT: 34 U/L — ABNORMAL HIGH (ref 6–29)
AST: 26 U/L (ref 10–35)
Albumin: 4.4 g/dL (ref 3.6–5.1)
Alkaline phosphatase (APISO): 93 U/L (ref 37–153)
BUN: 16 mg/dL (ref 7–25)
CO2: 30 mmol/L (ref 20–32)
Calcium: 9.5 mg/dL (ref 8.6–10.4)
Chloride: 104 mmol/L (ref 98–110)
Creat: 0.78 mg/dL (ref 0.50–0.99)
GFR, Est African American: 93 mL/min/{1.73_m2} (ref >=60)
GFR, Est Non African American: 80 mL/min/{1.73_m2} (ref >=60)
GLOBULIN: 2.4 g/dL (ref 1.9–3.7)
Glucose, Bld: 112 mg/dL — ABNORMAL HIGH (ref 65–99)
Potassium: 4.9 mmol/L (ref 3.5–5.3)
SODIUM: 143 mmol/L (ref 135–146)
Total Bilirubin: 0.4 mg/dL (ref 0.2–1.2)
Total Protein: 6.8 g/dL (ref 6.1–8.1)

## 2019-02-14 NOTE — Progress Notes (Signed)
Labs are stable.

## 2019-02-21 ENCOUNTER — Telehealth: Payer: Self-pay | Admitting: Rheumatology

## 2019-02-21 MED ORDER — FOLIC ACID 1 MG PO TABS: 1.0000 mg | ORAL_TABLET | Freq: Every day | ORAL | 4 refills | Status: DC

## 2019-02-21 NOTE — Telephone Encounter (Signed)
Patient left a voicemail requesting prescription refill of Folic Acid. 

## 2019-02-21 NOTE — Telephone Encounter (Signed)
last visit: 10/12/18 Next visit: 03/16/19  Okay to refill per Dr. Estanislado Pandy

## 2019-03-04 ENCOUNTER — Other Ambulatory Visit: Payer: Self-pay | Admitting: Rheumatology

## 2019-03-04 NOTE — Telephone Encounter (Signed)
last visit: 10/12/18 Next visit: 03/16/19 Labs:02/11/19 stable  Okay to refill per Dr. Estanislado Pandy

## 2019-03-14 NOTE — Progress Notes (Signed)
Virtual Visit via Telephone Note  I connected with Deborah Levy on 03/16/19 at 11:30 AM EDT by telephone and verified that I am speaking with the correct person using two identifiers.   I discussed the limitations, risks, security and privacy concerns of performing an evaluation and management service by telephone and the availability of in person appointments. I also discussed with the patient that there may be a patient responsible charge related to this service. The patient expressed understanding and agreed to proceed.  IO:[id number]  History of Present Illness: Patient is a 63 year old female with a past medical history of systemic lupus erythematosus, fibromyalgia, and osteoarthritis.  She is on MTX 0.4 ml sq once weekly and folic acid 1 mg po daily.  She takes zofran PRN for nausea after injecting MTX. She denies any recent lupus flares.  She denies any recent rashes.  She has chronic sicca symptoms.  She has no oral or nasal ulcerations.  She has generalized muscle aches and muscle tenderness due to fibromyalgia.  She has chronic pain in both knee joints.  She denies any joint swelling.  She was going to [place] on a regular basis but is unable to currently due to the coronavirus.  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  They are sterilizing the building today and she will return to the office Thursday.  She denies any signs or symptoms of coronavirus.    Review of Systems  Constitutional: Negative for chills, fever and malaise/fatigue.  HENT:       +Mouth dryness Denies oral or nasal ulcerations   Eyes:       +eye dryness  Respiratory: Negative for cough, shortness of breath and wheezing.   Cardiovascular: Negative for chest pain and palpitations.  Gastrointestinal: Negative for blood in stool, constipation and diarrhea.  Genitourinary: Negative for dysuria.  Musculoskeletal: Positive for joint pain and myalgias.   Skin: Negative for rash.  Neurological: Negative for dizziness and headaches.  Psychiatric/Behavioral: Negative for depression. The patient is not nervous/anxious and does not have insomnia.     Observations/Objective: Physical Exam  Constitutional: She is oriented to person, place, and time.  Neurological: She is alert and oriented to person, place, and time.  Psychiatric: Mood, memory, affect and judgment normal.   Patient reports morning stiffness for 1  hour.   Patient reports nocturnal pain.  Difficulty dressing/grooming: Denies Difficulty climbing stairs: Denies Difficulty getting out of chair: Denies Difficulty using hands for taps, buttons, cutlery, and/or writing: Denies  Assessment and Plan: Other systemic lupus erythematosus with other organ involvement (HCC) - Fatigue, arthralgia, malar rash, photosensitivity, positive subcutaneous lupus on biopsy, Positive ANA, Ro+ and La +: She has not had any recent lupus flares.  She is clinically doing well on MTX 0.4 ml sq once weekly and folic acid 1 mg po daily.  She takes Zofran 4 mg po PRN for nausea after taking MTX.  She has not any recent rashes resembling cutaneous lupus.  She was encouraged to wear sunscreen on a daily basis. No malar rash.  Denies any oral or nasal ulcerations.  She continues to have chronic sicca symptoms.  She has not had any symptoms of Raynaud's recently.  No SOB or palpitations.  She will continue injecting MTX 0.4 ml sq once weekly and folic acid 1 mg po daily.  She does not need any refills at this time.  She was advised to  notify us if she develops any signs or symptoms of a flare.  She will follow up in 3 months.   Cutaneous lupus erythematosus: She has not had any recent rashes.  The importance of sunscreen use was discussed.     High risk medication use - MTX 0.4 ml sq once weekly and folic acid 1 mg po daily  She does not need any refills at this time.  We discussed that anytime she gets an infection  she is to hold her dose of MTX.  The importance of good hand hygiene was discussed.  She will return for lab work in May and every 3 months.  Future orders placed today.   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  Fibromyalgia: She continues to have generalized muscle aches and muscle tenderness due to fibromyalgia. She has been seeing a Restaurant manager, fast food. She has chronic fatigue related to insomnia.  She takes Ambien 10 mg po at bedtime PRN.  We discussed the importance of regular exercise and good sleep hygiene.   Other fatigue: Chronic and related to insomnia. The need for regular exercise was discussed.   Primary insomnia - She is on Ambien 10 mg by mouth at bedtime.  Good sleep hygiene was discussed.   Primary osteoarthritis of left knee joint: She has chronic left knee joint pain.  No joint swelling.  No difficulty climbing steps or getting up from a chair.   Follow Up Instructions: She will return to the office in 3 months.   Future orders for autoimmune labs were placed today.  She is due for lab work in May and every 3 months.    I discussed the assessment and treatment plan with the patient. The patient was provided an opportunity to ask questions and all were answered. The patient agreed with the plan and demonstrated an understanding of the instructions.   The patient was advised to call back or seek an in-person evaluation if the symptoms worsen or if the condition fails to improve as anticipated.  I provided 23 minutes of non-face-to-face time during this encounter.  Bo Merino, MD   Scribed by- Ofilia Neas, PA-C

## 2019-03-16 ENCOUNTER — Telehealth (INDEPENDENT_AMBULATORY_CARE_PROVIDER_SITE_OTHER): Payer: BLUE CROSS/BLUE SHIELD | Admitting: Rheumatology

## 2019-03-16 ENCOUNTER — Other Ambulatory Visit: Payer: Self-pay

## 2019-03-16 ENCOUNTER — Encounter: Payer: Self-pay | Admitting: Rheumatology

## 2019-03-16 DIAGNOSIS — F5101 Primary insomnia: Secondary | ICD-10-CM

## 2019-03-16 DIAGNOSIS — M797 Fibromyalgia: Secondary | ICD-10-CM

## 2019-03-16 DIAGNOSIS — H35389 Toxic maculopathy, unspecified eye: Secondary | ICD-10-CM

## 2019-03-16 DIAGNOSIS — L932 Other local lupus erythematosus: Secondary | ICD-10-CM

## 2019-03-16 DIAGNOSIS — M3219 Other organ or system involvement in systemic lupus erythematosus: Secondary | ICD-10-CM

## 2019-03-16 DIAGNOSIS — Z8659 Personal history of other mental and behavioral disorders: Secondary | ICD-10-CM

## 2019-03-16 DIAGNOSIS — Z79899 Other long term (current) drug therapy: Secondary | ICD-10-CM

## 2019-03-16 DIAGNOSIS — Z8679 Personal history of other diseases of the circulatory system: Secondary | ICD-10-CM

## 2019-03-16 DIAGNOSIS — M1712 Unilateral primary osteoarthritis, left knee: Secondary | ICD-10-CM

## 2019-03-16 DIAGNOSIS — Z8639 Personal history of other endocrine, nutritional and metabolic disease: Secondary | ICD-10-CM

## 2019-03-16 DIAGNOSIS — Z853 Personal history of malignant neoplasm of breast: Secondary | ICD-10-CM

## 2019-03-16 DIAGNOSIS — R5383 Other fatigue: Secondary | ICD-10-CM

## 2019-03-16 DIAGNOSIS — Z8719 Personal history of other diseases of the digestive system: Secondary | ICD-10-CM

## 2019-03-16 DIAGNOSIS — T372X5A Adverse effect of antimalarials and drugs acting on other blood protozoa, initial encounter: Secondary | ICD-10-CM

## 2019-03-16 NOTE — Patient Instructions (Addendum)
Standing Labs We placed an order today for your standing lab work.    Please come back and get your standing labs at end of May and every 3 months  We have open lab Monday through Friday from 8:30-11:30 AM and 1:30-4:00 PM  at the office of Dr. Bo Merino.   You may experience shorter wait times on Monday and Friday afternoons. The office is located at [address] [place], [place],  [place] [address] No appointment is necessary.   Labs are drawn by [facility].  You may receive a bill from [facility] for your lab work.  If you wish to have your labs drawn at another location, please call the office 24 hours in advance to send orders.  If you have any questions regarding directions or hours of operation,  please call [phone].   Just as a reminder please drink plenty of water prior to coming for your lab work. Thanks!

## 2019-03-17 ENCOUNTER — Telehealth: Payer: Self-pay | Admitting: Rheumatology

## 2019-03-17 NOTE — Telephone Encounter (Signed)
-----   Message from [place] sent at 03/16/2019 12:15 PM EDT ----- Patient had virtual visit today with Dr. Estanislado Pandy. Please call to schedule 3 month follow up. Thanks!

## 2019-03-17 NOTE — Telephone Encounter (Signed)
LMOM for patient to call and schedule 3 month follow-up appointment. °

## 2019-03-22 ENCOUNTER — Ambulatory Visit: Payer: BLUE CROSS/BLUE SHIELD | Admitting: Neurology

## 2019-03-22 ENCOUNTER — Encounter

## 2019-04-06 ENCOUNTER — Other Ambulatory Visit: Payer: Self-pay | Admitting: Obstetrics and Gynecology

## 2019-04-06 DIAGNOSIS — Z1231 Encounter for screening mammogram for malignant neoplasm of breast: Secondary | ICD-10-CM

## 2019-04-07 ENCOUNTER — Other Ambulatory Visit: Payer: Self-pay | Admitting: Rheumatology

## 2019-06-06 ENCOUNTER — Ambulatory Visit: Payer: BLUE CROSS/BLUE SHIELD

## 2019-06-07 ENCOUNTER — Ambulatory Visit
Admission: RE | Admit: 2019-06-07 | Discharge: 2019-06-07 | Disposition: A | Discharge status: Home / Self Care | Payer: BC Managed Care – PPO | Source: Ambulatory Visit | Attending: Obstetrics and Gynecology | Admitting: Obstetrics and Gynecology

## 2019-06-07 ENCOUNTER — Other Ambulatory Visit: Payer: Self-pay

## 2019-06-07 DIAGNOSIS — Z1231 Encounter for screening mammogram for malignant neoplasm of breast: Secondary | ICD-10-CM

## 2019-06-08 ENCOUNTER — Other Ambulatory Visit: Payer: Self-pay | Admitting: Obstetrics and Gynecology

## 2019-06-08 DIAGNOSIS — R928 Other abnormal and inconclusive findings on diagnostic imaging of breast: Secondary | ICD-10-CM

## 2019-06-14 ENCOUNTER — Other Ambulatory Visit: Payer: Self-pay

## 2019-06-14 ENCOUNTER — Ambulatory Visit
Admission: RE | Admit: 2019-06-14 | Discharge: 2019-06-14 | Disposition: A | Discharge status: Home / Self Care | Payer: BC Managed Care – PPO | Source: Ambulatory Visit | Attending: Obstetrics and Gynecology | Admitting: Obstetrics and Gynecology

## 2019-06-14 DIAGNOSIS — R928 Other abnormal and inconclusive findings on diagnostic imaging of breast: Secondary | ICD-10-CM

## 2019-08-22 ENCOUNTER — Other Ambulatory Visit: Payer: Self-pay | Admitting: Rheumatology

## 2019-08-23 NOTE — Telephone Encounter (Signed)
Last Visit: 03/16/19 Next Visit was due July 2020. Message sent to the front to schedule   Okay to refill per Dr. Estanislado Pandy

## 2019-08-23 NOTE — Telephone Encounter (Signed)
Please schedule patient for a follow up visit. Patient was due July 2020. Thanks!

## 2019-08-24 NOTE — Progress Notes (Signed)
Office Visit Note  Patient: Deborah Levy             Date of Birth: 21-Nov-1956           MRN: [id number]             PCP: Serita Grit, PA-C Referring: Serita Grit, PA* Visit Date: 08/30/2019 Occupation: @GUAROCC @  Subjective:  Generalized pain   History of Present Illness: Deborah Levy is a 63 y.o. female with history of systemic lupus erythematosus, cutaneous lupus, and fibromyalgia.  Patient is on methotrexate 0.4 mL subcutaneous only once weekly and folic acid 1 mg by mouth daily.  She states she recently missed 1 dose of methotrexate but did not notice any new or worsening symptoms.  She denies any recent signs of a lupus flare.  She has not had any cutaneous lupus rashes or lesions recently.  She continues to have photosensitivity and tries to avoid sun exposure.  She continues to have chronic pain in bilateral hands and bilateral knee joints.  She denies any joint swelling.  She has chronic fatigue related to insomnia.  She takes Ambien as prescribed for insomnia.  She continues have generalized muscle aches and muscle tenderness.  She has been seeing a chiropractor twice a month and has been working with him for her lower back pain.  She denies any symptoms of Raynaud's.  She continues to have chronic sicca symptoms.  She denies any sores in her mouth or nose.  She denies any chest pain, shortness of breath, or palpitations.  She has not had any hair loss recently.    Activities of Daily Living:  Patient reports morning stiffness for 1.5 hours.   Patient Denies nocturnal pain.  Difficulty dressing/grooming: Denies Difficulty climbing stairs: Reports Difficulty getting out of chair: Reports Difficulty using hands for taps, buttons, cutlery, and/or writing: Reports  Review of Systems  Constitutional: Positive for fatigue.  HENT: Positive for mouth dryness. Negative for mouth sores and nose dryness.   Eyes: Positive for dryness. Negative for pain and  visual disturbance.  Respiratory: Negative for cough, hemoptysis, shortness of breath and difficulty breathing.   Cardiovascular: Negative for chest pain, palpitations, hypertension and swelling in legs/feet.  Gastrointestinal: Negative for blood in stool, constipation and diarrhea.  Endocrine: Negative for increased urination.  Genitourinary: Negative for difficulty urinating and painful urination.  Musculoskeletal: Positive for arthralgias, joint pain, joint swelling and morning stiffness. Negative for myalgias, muscle weakness, muscle tenderness and myalgias.  Skin: Positive for sensitivity to sunlight. Negative for color change, pallor, rash, hair loss, nodules/bumps, skin tightness and ulcers.  Allergic/Immunologic: Negative for susceptible to infections.  Neurological: Negative for dizziness and numbness.  Hematological: Negative for bruising/bleeding tendency and swollen glands.  Psychiatric/Behavioral: Negative for depressed mood and sleep disturbance. The patient is not nervous/anxious.     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   Type 2 diabetes mellitus ([place]) 11/22/2014   Chronic pain 11/13/2014   Fibromyalgia 11/13/2014   Rotator cuff impingement syndrome 11/13/2014   Cannot sleep 02/13/2014   Essential (primary) hypertension 09/07/2013   HLD (hyperlipidemia) 09/07/2013   Adiposity 09/07/2013   Systemic lupus  erythematosus ([place]) 05/21/2013   Hypokalemia 04/21/2013   Breast cancer of lower-inner quadrant of left female breast ([place]) 01/12/2012    Past Medical History:  Diagnosis Date   Breast cancer ([place])    Cancer ([place])      Fibromyalgia    GERD (gastroesophageal reflux disease)    Hypertension    Lupus ([place])     Family History  Problem Relation Age of Onset   Cancer Father        colon   Hypertension Father    Heart disease Father    Cancer Sister        Cervical   Cancer Brother        prostate    Heart disease Maternal Grandfather    Hypertension Paternal Grandmother    Heart disease Paternal Grandmother    Breast cancer Neg Hx    Past Surgical History:  Procedure Laterality Date   BREAST LUMPECTOMY Left 2012   BREAST SURGERY  03/10/2011   Lt br lumpectomy   COLONOSCOPY     Removed 2 polpys    KNEE SURGERY     SHOULDER SURGERY     TUBAL LIGATION     Social History   Social History Narrative   Not on file    There is no immunization history on file for this patient.   Objective: Vital Signs: BP 134/77 (BP Location: Right Arm, Patient Position: Sitting, Cuff Size: Normal)    Pulse 77    Resp 18    Ht 4' 11.5" (1.511 m)    Wt 183 lb 6.4 oz (83.2 kg)    BMI 36.42 kg/m    Physical Exam Vitals signs and nursing note reviewed.  Constitutional:      Appearance: She is well-developed.  HENT:     Head: Normocephalic and atraumatic.  Eyes:     Conjunctiva/sclera: Conjunctivae normal.  Neck:     Musculoskeletal: Normal range of motion.  Cardiovascular:     Rate and Rhythm: Normal rate and regular rhythm.     Heart sounds: Normal heart sounds.  Pulmonary:     Effort: Pulmonary effort is normal.     Breath sounds: Normal breath sounds.  Abdominal:     General: Bowel sounds are normal.     Palpations: Abdomen is soft.  Lymphadenopathy:     Cervical: No cervical adenopathy.  Skin:    General: Skin is warm and dry.     Capillary Refill: Capillary refill takes less than 2 seconds.  Neurological:     Mental Status: She is alert and oriented to person, place, and time.  Psychiatric:        Behavior: Behavior normal.      Musculoskeletal Exam: C-spine,  thoracic spine, lumbar spine good range of motion.  No midline spinal tenderness.  Shoulder joints, elbow joints, wrist joints, MCPs, PIPs, DIPs good range of motion with no synovitis.  She has PIP and DIP synovial thickening consistent with osteoarthritis of both hands.  Hip joints, knee joints, ankle joints, MTPs, PIPs, DIPs good range of motion no synovitis.  No warmth or effusion of bilateral knee joints.  No tenderness or swelling of ankle joints.  CDAI Exam: CDAI Score: -- Patient Global: --; Provider Global: -- Swollen: --; Tender: -- Joint Exam   No joint exam has been documented for this visit   There is currently no information documented on the homunculus. Go to the Rheumatology activity and complete the homunculus joint exam.  Investigation: No additional  findings.  Imaging: No results found.  Recent Labs: Lab Results  Component Value Date   WBC 4.6 02/11/2019   HGB 13.9 02/11/2019   PLT 290 02/11/2019   NA 143 02/11/2019   K 4.9 02/11/2019   CL 104 02/11/2019   CO2 30 02/11/2019   GLUCOSE 112 (H) 02/11/2019   BUN 16 02/11/2019   CREATININE 0.78 02/11/2019   BILITOT 0.4 02/11/2019   ALKPHOS 86 07/01/2017   AST 26 02/11/2019   ALT 34 (H) 02/11/2019   PROT 6.8 02/11/2019   ALBUMIN 4.3 07/01/2017   CALCIUM 9.5 02/11/2019   GFRAA 93 02/11/2019    Speciality Comments: No specialty comments available.  Procedures:  No procedures performed Allergies: Sulfa antibiotics, Metformin, and Sulfasalazine       Assessment / Plan:     Visit Diagnoses: Other systemic lupus erythematosus with other organ involvement (HCC) - Fatigue, arthralgia, malar rash, photosensitivity, positive subcutaneous lupus on biopsy, Positive ANA, Ro+ and La +: She has not had any signs or symptoms of a lupus flare recently.  She is clinically doing well on methotrexate 0.4 mL subcu once weekly and folic acid 1 mg by mouth daily.  She has chronic pain in bilateral hands and bilateral knee  joints but no synovitis was noted.  She is been having generalized muscle aches and muscle tenderness due to fibromyalgia.  She has chronic fatigue due to insomnia but has not had any fevers or swollen lymph nodes recently.  She has not had any symptoms of Raynaud's and no digital stations or signs of gangrene were noted.  He has chronic sicca symptoms.  No oral or nasal ulcerations noted.  No malar rash on exam.  She denies any shortness of breath palpitations or chest pain.  She will continue on methotrexate as prescribed.  She does not need any refills at this time.  She was advised to notify us if she develops signs or symptoms of a flare.  We will check autoimmune lab work today.  She will follow-up in the office in 5 months.- Plan: CBC with Differential/Platelet, COMPLETE METABOLIC PANEL WITH GFR, Urinalysis, Routine w reflex microscopic, ANA, Anti-DNA antibody, double-stranded, C3 and C4, Sedimentation rate  Cutaneous lupus erythematosus: She has not had any recent cutaneous lupus rashes or lesions.  We discussed the importance of avoiding sun exposure.  We discussed the importance of wearing sunscreen greater than SPF 50 on a daily basis.  She was advised to notify us if she develops signs or symptoms of a flare.  High risk medication use - MTX 0.4 ml sq once weekly and folic acid 1 mg po daily.Most recent CBC/CMP within normal limits except for elevated ALT but stable on 02/11/2019. She is overdue for labs.  CBC/CMP ordered for today and will monitor every 3 months. 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  No warmth or effusion was noted.  She has intermittent discomfort in both knee joints.  Fibromyalgia: She has generalized muscle aches and muscle tenderness due to fibromyalgia.  She takes Robaxin and Zanaflex  as prescribed.  She is on Lyrica 300 mg by mouth daily.  She takes tramadol if she is in severe discomfort.  She has chronic fatigue related to insomnia.  She takes Ambien 10 mg by mouth at bedtime.  We discussed importance of  regular exercise and good sleep hygiene.  She has been trying to sleep about 7 - 8 hours per night.  Other fatigue: She has chronic fatigue related to insomnia.  Primary insomnia - She takes Ambien 10 mg po at bedtime PRN.   Other medical conditions are listed as follows:   History of vitamin D deficiency  History of hypertension  History of hyperlipidemia  History of diabetes mellitus  History of gastroesophageal reflux (GERD)  History of depression  History of breast cancer  Orders: Orders Placed This Encounter  Procedures   CBC with Differential/Platelet   COMPLETE METABOLIC PANEL WITH GFR   Urinalysis, Routine w reflex microscopic   ANA   Anti-DNA antibody, double-stranded   C3 and C4   Sedimentation rate   No orders of the defined types were placed in this encounter.   Face-to-face time spent with patient was 30 minutes. Greater than 50% of time was spent in counseling and coordination of care.  Follow-Up Instructions: Return in about 3 months (around 11/29/2019) for Osteoarthritis, Systemic lupus, FMS.   Ofilia Neas, PA-C  Note - This record has been created using Dragon software.  Chart creation errors have been sought, but may not always  have been located. Such creation errors do not reflect on  the standard of medical care.

## 2019-08-30 ENCOUNTER — Encounter: Payer: Self-pay | Admitting: Physician Assistant

## 2019-08-30 ENCOUNTER — Other Ambulatory Visit: Payer: Self-pay

## 2019-08-30 ENCOUNTER — Ambulatory Visit: Payer: BC Managed Care – PPO | Admitting: Physician Assistant

## 2019-08-30 VITALS — BP 134/77 | HR 77 | Resp 18 | Ht 59.5 in | Wt 183.4 lb

## 2019-08-30 DIAGNOSIS — Z79899 Other long term (current) drug therapy: Secondary | ICD-10-CM | POA: Diagnosis not present

## 2019-08-30 DIAGNOSIS — M1712 Unilateral primary osteoarthritis, left knee: Secondary | ICD-10-CM

## 2019-08-30 DIAGNOSIS — H35389 Toxic maculopathy, unspecified eye: Secondary | ICD-10-CM | POA: Diagnosis not present

## 2019-08-30 DIAGNOSIS — Z8659 Personal history of other mental and behavioral disorders: Secondary | ICD-10-CM

## 2019-08-30 DIAGNOSIS — Z853 Personal history of malignant neoplasm of breast: Secondary | ICD-10-CM

## 2019-08-30 DIAGNOSIS — M3219 Other organ or system involvement in systemic lupus erythematosus: Secondary | ICD-10-CM | POA: Diagnosis not present

## 2019-08-30 DIAGNOSIS — Z8639 Personal history of other endocrine, nutritional and metabolic disease: Secondary | ICD-10-CM

## 2019-08-30 DIAGNOSIS — L932 Other local lupus erythematosus: Secondary | ICD-10-CM | POA: Diagnosis not present

## 2019-08-30 DIAGNOSIS — F5101 Primary insomnia: Secondary | ICD-10-CM

## 2019-08-30 DIAGNOSIS — Z8719 Personal history of other diseases of the digestive system: Secondary | ICD-10-CM

## 2019-08-30 DIAGNOSIS — M797 Fibromyalgia: Secondary | ICD-10-CM

## 2019-08-30 DIAGNOSIS — R5383 Other fatigue: Secondary | ICD-10-CM

## 2019-08-30 DIAGNOSIS — T372X5A Adverse effect of antimalarials and drugs acting on other blood protozoa, initial encounter: Secondary | ICD-10-CM

## 2019-08-30 DIAGNOSIS — Z8679 Personal history of other diseases of the circulatory system: Secondary | ICD-10-CM

## 2019-08-30 NOTE — Patient Instructions (Signed)
Standing Labs We placed an order today for your standing lab work.    Please come back and get your standing labs in December and every 3 months  We have open lab daily Monday through Thursday from 8:30-12:30 PM and 1:30-4:30 PM and Friday from 8:30-12:30 PM and 1:30 -4:00 PM at the office of Dr. Shaili Deveshwar.   You may experience shorter wait times on Monday and Friday afternoons. The office is located at [address], [address], [place], [place] [address] No appointment is necessary.   Labs are drawn by [facility].  You may receive a bill from [facility] for your lab work.  If you wish to have your labs drawn at another location, please call the office 24 hours in advance to send orders.  If you have any questions regarding directions or hours of operation,  please call [phone].   Just as a reminder please drink plenty of water prior to coming for your lab work. Thanks!  

## 2019-09-01 LAB — CBC WITH DIFFERENTIAL/PLATELET
Absolute Monocytes: 522 cells/uL (ref 200–950)
Basophils Absolute: 42 cells/uL (ref 0–200)
Basophils Relative: 0.7 %
Eosinophils Absolute: 102 cells/uL (ref 15–500)
Eosinophils Relative: 1.7 %
HCT: 40.1 % (ref 35.0–45.0)
Hemoglobin: 14.1 g/dL (ref 11.7–15.5)
Lymphs Abs: 1668 cells/uL (ref 850–3900)
MCH: 30.9 pg (ref 27.0–33.0)
MCHC: 35.2 g/dL (ref 32.0–36.0)
MCV: 87.9 fL (ref 80.0–100.0)
MPV: 10.8 fL (ref 7.5–12.5)
Monocytes Relative: 8.7 %
Neutro Abs: 3666 cells/uL (ref 1500–7800)
Neutrophils Relative %: 61.1 %
Platelets: 288 10*3/uL (ref 140–400)
RBC: 4.56 10*6/uL (ref 3.80–5.10)
RDW: 13.1 % (ref 11.0–15.0)
Total Lymphocyte: 27.8 %
WBC: 6 10*3/uL (ref 3.8–10.8)

## 2019-09-01 LAB — URINALYSIS, ROUTINE W REFLEX MICROSCOPIC
Bacteria, UA: NONE SEEN /HPF
Bilirubin Urine: NEGATIVE
Glucose, UA: NEGATIVE
Hgb urine dipstick: NEGATIVE
Hyaline Cast: NONE SEEN /LPF
Ketones, ur: NEGATIVE
Nitrite: NEGATIVE
Protein, ur: NEGATIVE
Specific Gravity, Urine: 1.023 (ref 1.001–1.03)
Squamous Epithelial / HPF: NONE SEEN /HPF (ref <=5)
pH: 7 (ref 5.0–8.0)

## 2019-09-01 LAB — COMPLETE METABOLIC PANEL WITH GFR
AG Ratio: 1.5 (calc) (ref 1.0–2.5)
ALT: 61 U/L — ABNORMAL HIGH (ref 6–29)
AST: 48 U/L — ABNORMAL HIGH (ref 10–35)
Albumin: 4.1 g/dL (ref 3.6–5.1)
Alkaline phosphatase (APISO): 86 U/L (ref 37–153)
BUN: 15 mg/dL (ref 7–25)
CO2: 32 mmol/L (ref 20–32)
Calcium: 9.3 mg/dL (ref 8.6–10.4)
Chloride: 100 mmol/L (ref 98–110)
Creat: 0.79 mg/dL (ref 0.50–0.99)
GFR, Est African American: 91 mL/min/{1.73_m2} (ref >=60)
GFR, Est Non African American: 79 mL/min/{1.73_m2} (ref >=60)
Globulin: 2.8 g/dL (calc) (ref 1.9–3.7)
Glucose, Bld: 119 mg/dL — ABNORMAL HIGH (ref 65–99)
Potassium: 3.8 mmol/L (ref 3.5–5.3)
Sodium: 140 mmol/L (ref 135–146)
Total Bilirubin: 0.5 mg/dL (ref 0.2–1.2)
Total Protein: 6.9 g/dL (ref 6.1–8.1)

## 2019-09-01 LAB — SEDIMENTATION RATE: Sed Rate: 14 mm/h (ref 0–30)

## 2019-09-01 LAB — ANTI-NUCLEAR AB-TITER (ANA TITER): ANA Titer 1: 1:640 {titer} — ABNORMAL HIGH

## 2019-09-01 LAB — C3 AND C4
C3 Complement: 159 mg/dL (ref 83–193)
C4 Complement: 28 mg/dL (ref 15–57)

## 2019-09-01 LAB — ANTI-DNA ANTIBODY, DOUBLE-STRANDED: ds DNA Ab: <1 IU/mL

## 2019-09-01 LAB — ANA: Anti Nuclear Antibody (ANA): POSITIVE — AB

## 2019-09-01 NOTE — Progress Notes (Signed)
LFTs are elevated and trending up.  She is taking MTX 0.4 ml sq once weekly and is on Lipitor. Spoke with Dr. Estanislado Pandy and she would like the patient to reduce MTX to 0.3 ml sq one weekly.  Please advise patient to avoid tylenol, NSAIDs, and alcohol.  CBC WNL.  UA trace leukocytes.  Please notify patient and advise patient to follow up with PCP if she is symptomatic for a UTI. DsDNA negative.  Complements WNL.  Sed rate WNL. ANA 1:640 NS-stable.

## 2019-09-05 ENCOUNTER — Other Ambulatory Visit: Payer: Self-pay | Admitting: *Deleted

## 2019-09-05 MED ORDER — ONDANSETRON HCL 4 MG PO TABS: ORAL_TABLET | ORAL | 2 refills | Status: DC

## 2019-09-05 NOTE — Telephone Encounter (Signed)
Last Visit: 08/30/19 Next Visit: 12/05/19  Okay to refill per Dr. Estanislado Pandy

## 2019-09-15 ENCOUNTER — Telehealth: Payer: Self-pay | Admitting: Rheumatology

## 2019-09-15 ENCOUNTER — Other Ambulatory Visit: Payer: Self-pay

## 2019-09-15 DIAGNOSIS — Z20822 Contact with and (suspected) exposure to covid-19: Secondary | ICD-10-CM

## 2019-09-15 NOTE — Telephone Encounter (Signed)
Attempted to contact the patient and left message for patient to contact the office.  

## 2019-09-15 NOTE — Telephone Encounter (Signed)
Please advise patient to notify PCP of her recent exposure. She should get tested for COVID-19. She is on MTX for the treatment of lupus.  Please advise patient to notify us on the test results.

## 2019-09-15 NOTE — Telephone Encounter (Signed)
Patient states she advised on 09/14/19 that she was exposed to a coworker who has tested positive for COVID. Patient states the coworker had went on vacation a week prior to coming back and  testing positive Patient states she has been in close contact with this coworker. Patient states she was advised by her job to stay home an quarantine for 14 days. Patient Patient states they were wearing mask sometimes and other times they were not. Patient states she has her "normal headache and aches and pains but nothing that seems out of the ordinary". Patient denies any cough fever or SOB. Patient would like to know if she should get tested for COVID.

## 2019-09-15 NOTE — Telephone Encounter (Signed)
Patient advised  to notify PCP of her recent exposure. Patient advised she should get tested for COVID-19. She is on MTX for the treatment of lupus.  Patient advised to notify us on the test results.  Patient verbalized understanding.

## 2019-09-15 NOTE — Telephone Encounter (Signed)
Patient left a voicemail stating she just found out that someone at work has tested positive for COVID.  Patient states she was within 6 feet of the employee.  Patient is requesting a return call.

## 2019-09-16 ENCOUNTER — Telehealth: Payer: Self-pay

## 2019-09-16 LAB — NOVEL CORONAVIRUS, NAA: SARS-CoV-2, NAA: NOT DETECTED

## 2019-09-16 NOTE — Telephone Encounter (Signed)
Patient left message on machine stating her COVID test came back negative. Patient would like to know if she can return to work or should she quarantine the entire 14 days at home. Please call patient to advise.

## 2019-09-19 NOTE — Telephone Encounter (Signed)
Patient advised that she should continue to quarantine the rest of the 14 days as she had the close exposure to COVID.

## 2019-10-22 ENCOUNTER — Other Ambulatory Visit: Payer: Self-pay | Admitting: Rheumatology

## 2019-10-24 NOTE — Telephone Encounter (Addendum)
Last Visit: 08/30/19 Next Visit: 12/05/19 Labs: 08/30/19 LFTs are elevated and trending up. CBC WNL. reduce MTX to 0.3 ml sq one weekly  Okay to refill per Dr. Estanislado Pandy

## 2019-10-30 IMAGING — MG DIGITAL DIAGNOSTIC UNILATERAL RIGHT MAMMOGRAM WITH TOMO AND CAD
6 of 10 series · 6 of 30 positions shown · non-contrast
Comparison: Previous exams including recent screening mammogram
dated 06/07/2019

CLINICAL DATA: Patient returns today to evaluate a possible RIGHT
breast mass questioned on recent screening mammogram.History of LEFT
breast cancer in 9419 status post breast conservation surgery.

EXAM:
DIGITAL DIAGNOSTIC RIGHT MAMMOGRAM WITH CAD AND TOMO
ULTRASOUND RIGHT BREAST

[R CC synth-2D (1 of 2)]
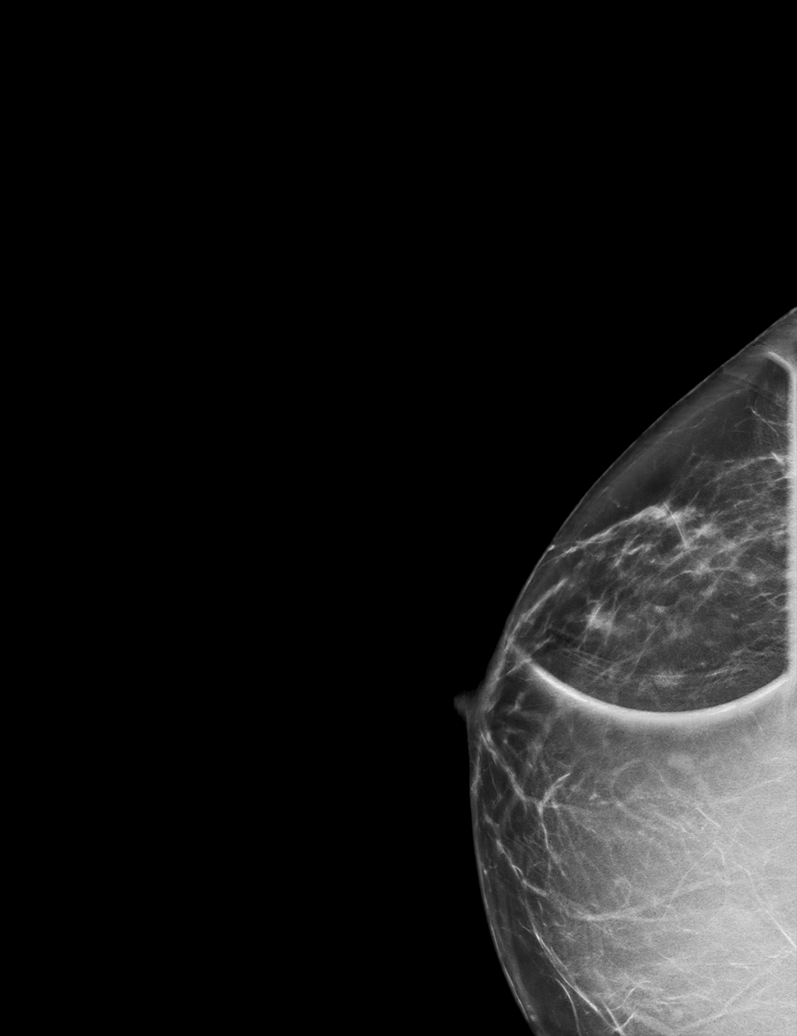

[R MLO synth-2D (1 of 2)]
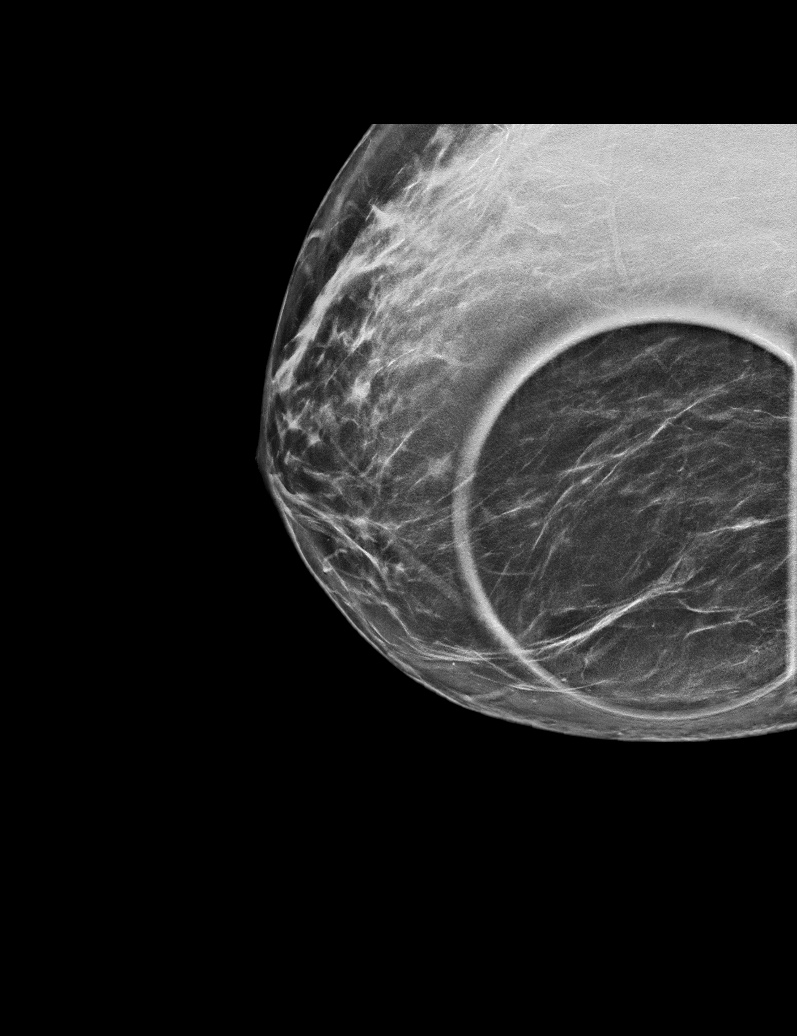

[R MLO synth-2D (2 of 2)]
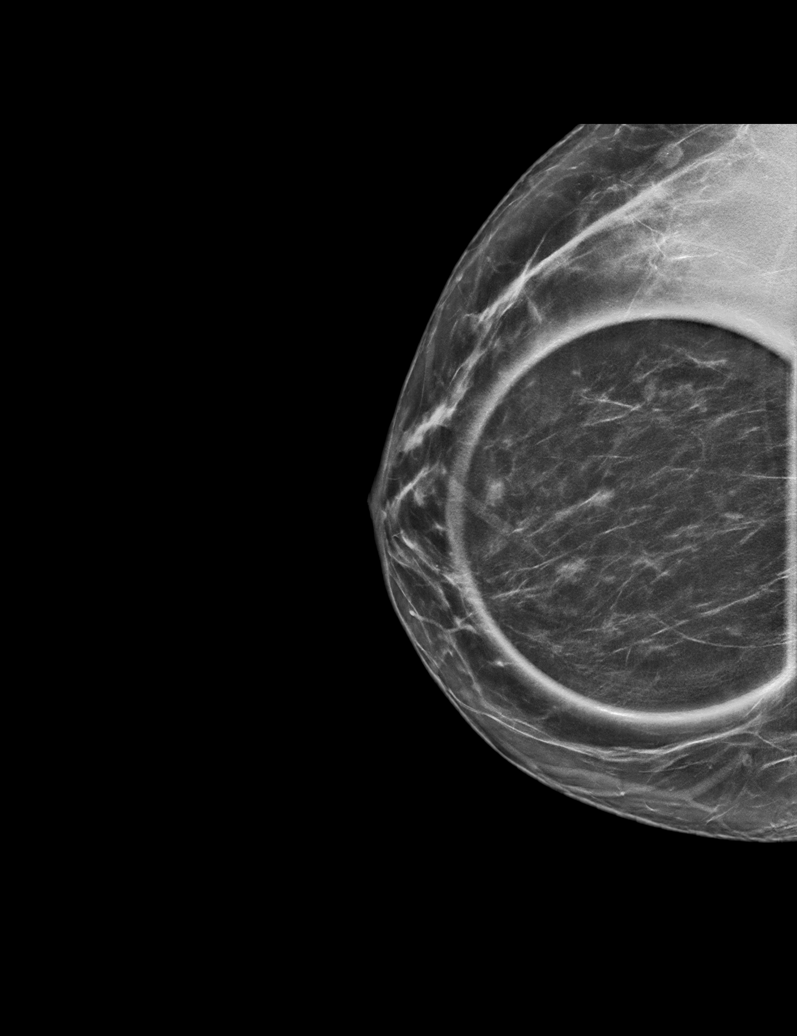

[R ML synth-2D]
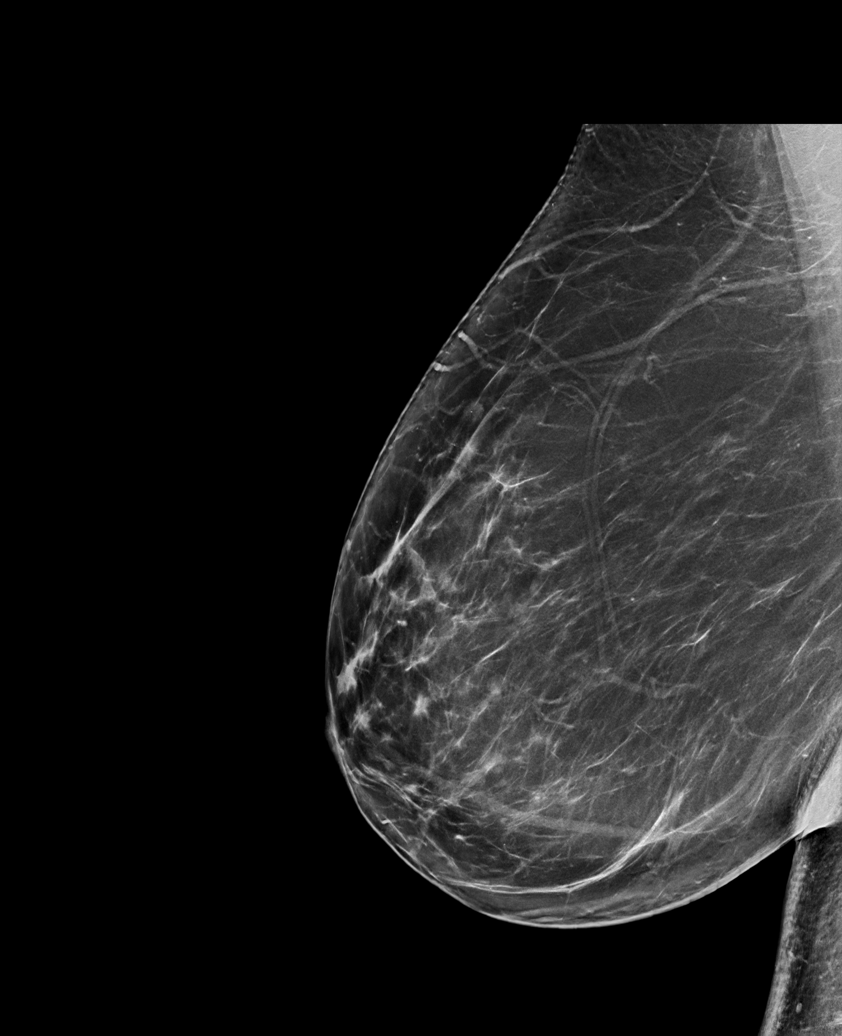

[R CC synth-2D (2 of 2)]
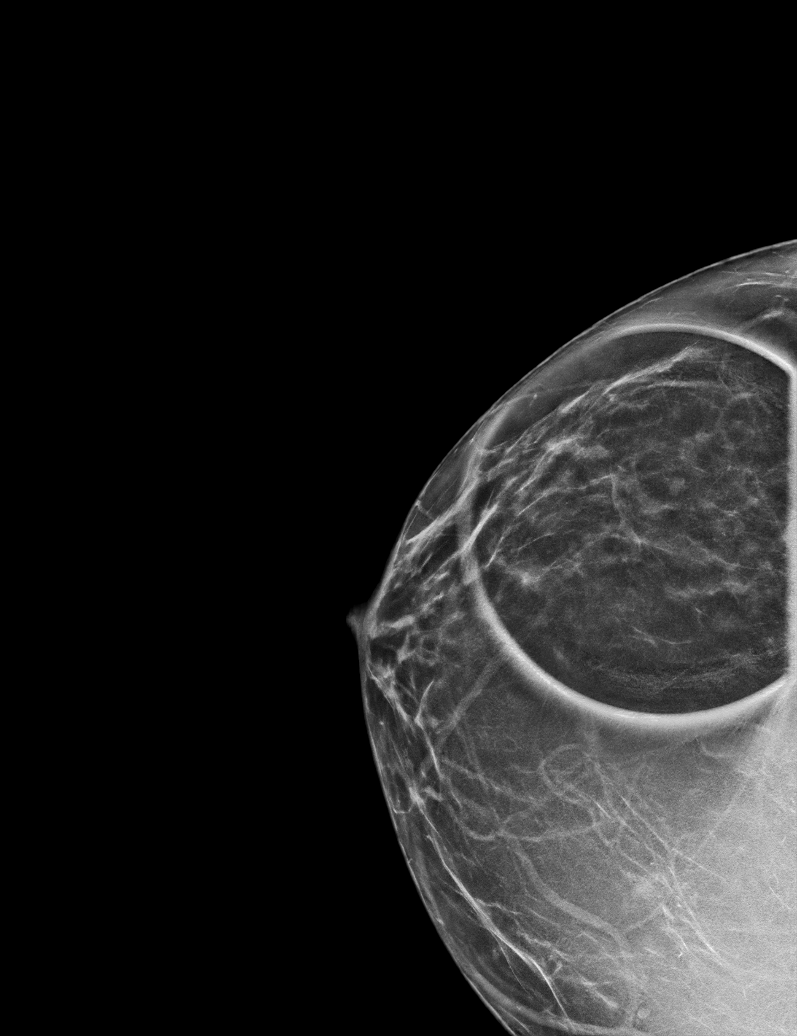

[R CC tomo · tomo slice 31/62.0]
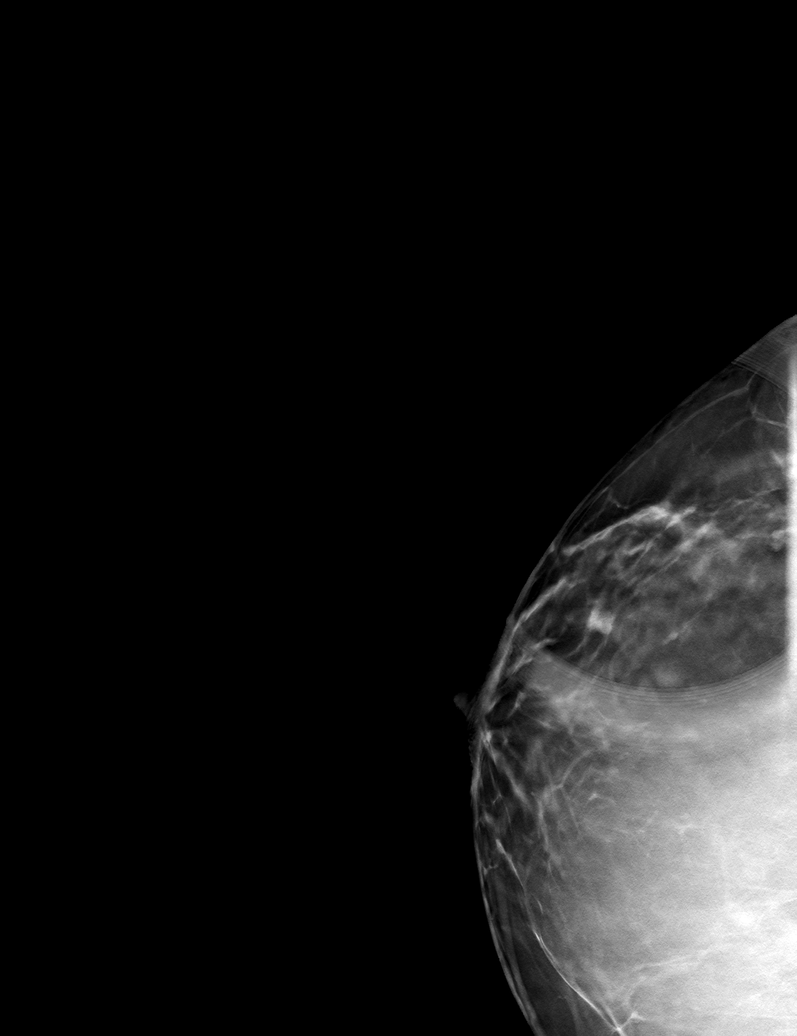

[6 of 30 positions shown; findings below may reference images not displayed]

ACR Breast Density Category b: There are scattered areas of
fibroglandular density.
FINDINGS: Scattered small partially obscured masses are confirmed within the
subareolar RIGHT breast, slightly inner and slightly outer, largest
measuring approximately 5 mm greatest dimension.

Mammographic images were processed with CAD.

Targeted ultrasound is performed, showing scattered small cysts
within the subareolar/periareolar RIGHT breast, corresponding to the
mammographic findings, largest measuring 6 mm. No suspicious solid
or cystic mass is identified.
IMPRESSION: No evidence of malignancy within the RIGHT breast. Small benign
cysts within the subareolar/periareolar RIGHT breast.

Patient may return to routine annual bilateral screening mammogram
schedule.

RECOMMENDATION:
Screening mammogram in one year.(Code:[id number])

I have discussed the findings and recommendations with the patient.
Results were also provided in writing at the conclusion of the
visit. If applicable, a reminder letter will be sent to the patient
regarding the next appointment.

BI-RADS CATEGORY  2: Benign.

## 2019-11-20 ENCOUNTER — Other Ambulatory Visit: Payer: Self-pay | Admitting: Rheumatology

## 2019-11-21 NOTE — Telephone Encounter (Deleted)
Last Visit: 08/30/2019 Next Visit: 12/05/2019 Labs: 08/30/2019 LFTs are elevated and trending up. She is taking MTX 0.4 ml sq once weekly and is on Lipitor. Spoke with Dr. Estanislado Pandy and she would like the patient to reduce MTX to 0.3 ml sq one weekly. Please advise patient to avoid tylenol, NSAIDs, and alcohol. CBC WNL. UA trace leukocytes. Please notify patient and advise patient to follow up with PCP if she is symptomatic for a UTI. DsDNA negative. Complements WNL. Sed rate WNL. ANA 1:640 NS-stable.   Okay to refill per Dr. Estanislado Pandy.

## 2019-12-01 NOTE — Progress Notes (Signed)
Virtual Visit via Telephone Note  I connected with Deborah Levy on 12/05/19 at 11:15 AM EST by telephone and verified that I am speaking with the correct person using two identifiers.  Location: Patient: Home  Provider: Clinic  This service was conducted via virtual visit. The patient was located at home. I was located in my office.  Consent was obtained prior to the virtual visit and is aware of possible charges through their insurance for this visit.  The patient is an established patient.  Dr. Estanislado Pandy, MD conducted the virtual visit and Hazel Sams, PA-C acted as scribe during the service.  Office staff helped with scheduling follow up visits after the service was conducted.   I discussed the limitations, risks, security and privacy concerns of performing an evaluation and management service by telephone and the availability of in person appointments. I also discussed with the patient that there may be a patient responsible charge related to this service. The patient expressed understanding and agreed to proceed.  CC: Generalized pain  History of Present Illness: Patient is a 63 year old female with a past medical history systemic lupus erythematosus, cutaneous lupus, fibromyalgia, and osteoarthritis.  She is on MTX 0.3 ml sq once weekly and folic acid 1 mg po daily. She denies ant recent lupus flares.  She is having muscle tenderness in bilateral lower extremities.  She has chronic pain in both knee joints but denies any joint swelling. She goes to a pain management clinic.  She is scheduled for an upcoming right SI joint injection.  She has chronic fatigue. She denies any recent rashes or photosensitivity.  She has chronic sicca symptoms.  She denies any fevers or enlarged lymph nodes.   Review of Systems  Constitutional: Positive for malaise/fatigue. Negative for fever.  HENT: Negative for congestion.        +Dry mouth  Eyes: Negative for photophobia, pain, discharge and redness.      +Dry eyes  Respiratory: Negative for cough, shortness of breath and wheezing.   Cardiovascular: Negative for chest pain, palpitations and leg swelling.  Gastrointestinal: Positive for constipation. Negative for blood in stool and diarrhea.  Genitourinary: Negative for dysuria and frequency.  Musculoskeletal: Positive for back pain and joint pain. Negative for myalgias and neck pain.  Skin: Negative for rash.  Neurological: Negative for dizziness and headaches.  Psychiatric/Behavioral: Negative for depression. The patient has insomnia. The patient is not nervous/anxious.       Observations/Objective: Physical Exam  Constitutional: She is oriented to person, place, and time.  Neurological: She is alert and oriented to person, place, and time.  Psychiatric: Mood, memory, affect and judgment normal.   Patient reports morning stiffness for 1.5 hours.   Patient denies nocturnal pain.  Difficulty dressing/grooming: Denies Difficulty climbing stairs: Reports Difficulty getting out of chair: Reports Difficulty using hands for taps, buttons, cutlery, and/or writing: Denies   Assessment and Plan: Visit Diagnoses: Other systemic lupus erythematosus with other organ involvement (HCC) - Fatigue, arthralgia, malar rash, photosensitivity, positive subcutaneous lupus on biopsy, Positive ANA, Ro+ and La +: She has not had any signs or symptoms of a lupus flare recently.  She is clinically doing well on MTX 0.3 ml sq once weekly and folic acid 1 mg po daily.  She has chronic pain related to fibromyalgia and takes robaxin, zanaflex, and lyrica as prescribed for pain management.  She does not have any joint inflammation at this time. She has not had any recent rashes,  photosensitivity, Raynaud's, oral or nasal ulcerations, or enlarged lymph nodes.  She has chronic sicca symptoms.  She has chronic fatigue related to insomnia secondary to nocturnal pain. She will continue on MTX and folic acid as prescribed.  Future orders for autoimmune las are in place.  She was advised to notify us if she develops any new or worsening symptoms.  She will follow up in 4-5 months.   Cutaneous lupus erythematosus: She has not had any flares recently. She has no photosensitivity at this time.  She was encouraged to continue to avoid sun exposure and to wear sunscreen daily.   High risk medication use - MTX 0.3 ml sq once weekly and folic acid 1 mg po daily.  CBC and CMP were drawn on 08/30/19.  She is due to update lab work.  She is on the reduced dose of MTX due to elevated LFTs.   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  No inflammation at this time.  She has been experiencing increased muscle tenderness in bilateral lower extremities, which she attributes to fibromyalgia.   Fibromyalgia: She has generalized muscle aches and muscle tenderness due to fibromyalgia.  She takes Robaxin and Zanaflex as needed for muscle spasms.  She is on Lyrica 300 mg by mouth daily. She will continue on the current treatment regimen.  She goes to pain management.  She has an upcoming right SI joint cortisone injection scheduled.  She has been experiencing increased muscle tenderness in bilateral lower extremities recently.  We discussed the importance of regular exercise and good sleep hygiene.      Other fatigue: She has chronic fatigue related to insomnia.  Primary insomnia - She takes Ambien 10 mg po at bedtime PRN. Good sleep hygiene was discussed.   Other medical conditions are listed as follows:   History of vitamin D deficiency  History of hypertension  History of hyperlipidemia  History of diabetes mellitus  History of gastroesophageal reflux (GERD)  History of depression  History of breast cancer   Follow Up Instructions: She will follow up in 4-5 months.    I discussed the assessment and treatment plan  with the patient. The patient was provided an opportunity to ask questions and all were answered. The patient agreed with the plan and demonstrated an understanding of the instructions.   The patient was advised to call back or seek an in-person evaluation if the symptoms worsen or if the condition fails to improve as anticipated.  I provided 15 minutes of non-face-to-face time during this encounter.   Bo Merino, MD    Scribed by-  Hazel Sams, PA-C

## 2019-12-05 ENCOUNTER — Telehealth (INDEPENDENT_AMBULATORY_CARE_PROVIDER_SITE_OTHER): Payer: BC Managed Care – PPO | Admitting: Rheumatology

## 2019-12-05 ENCOUNTER — Other Ambulatory Visit: Payer: Self-pay

## 2019-12-05 ENCOUNTER — Encounter: Payer: Self-pay | Admitting: Rheumatology

## 2019-12-05 DIAGNOSIS — F5101 Primary insomnia: Secondary | ICD-10-CM

## 2019-12-05 DIAGNOSIS — L932 Other local lupus erythematosus: Secondary | ICD-10-CM | POA: Diagnosis not present

## 2019-12-05 DIAGNOSIS — H35389 Toxic maculopathy, unspecified eye: Secondary | ICD-10-CM

## 2019-12-05 DIAGNOSIS — M797 Fibromyalgia: Secondary | ICD-10-CM

## 2019-12-05 DIAGNOSIS — R5383 Other fatigue: Secondary | ICD-10-CM

## 2019-12-05 DIAGNOSIS — M1712 Unilateral primary osteoarthritis, left knee: Secondary | ICD-10-CM

## 2019-12-05 DIAGNOSIS — Z79899 Other long term (current) drug therapy: Secondary | ICD-10-CM | POA: Diagnosis not present

## 2019-12-05 DIAGNOSIS — M3219 Other organ or system involvement in systemic lupus erythematosus: Secondary | ICD-10-CM

## 2019-12-05 DIAGNOSIS — Z8719 Personal history of other diseases of the digestive system: Secondary | ICD-10-CM

## 2019-12-05 DIAGNOSIS — Z853 Personal history of malignant neoplasm of breast: Secondary | ICD-10-CM

## 2019-12-05 DIAGNOSIS — Z8679 Personal history of other diseases of the circulatory system: Secondary | ICD-10-CM

## 2019-12-05 DIAGNOSIS — Z8639 Personal history of other endocrine, nutritional and metabolic disease: Secondary | ICD-10-CM

## 2019-12-05 DIAGNOSIS — Z8659 Personal history of other mental and behavioral disorders: Secondary | ICD-10-CM

## 2019-12-05 DIAGNOSIS — T372X5A Adverse effect of antimalarials and drugs acting on other blood protozoa, initial encounter: Secondary | ICD-10-CM

## 2019-12-07 ENCOUNTER — Telehealth: Payer: Self-pay | Admitting: Rheumatology

## 2019-12-07 ENCOUNTER — Other Ambulatory Visit: Payer: Self-pay

## 2019-12-07 DIAGNOSIS — Z79899 Other long term (current) drug therapy: Secondary | ICD-10-CM

## 2019-12-07 NOTE — Telephone Encounter (Signed)
LMOM for patient to call and schedule 4-5 month follow-up appointment.

## 2019-12-07 NOTE — Telephone Encounter (Signed)
-----   Message from Shona Needles, RT sent at 12/05/2019  2:29 PM EST ----- Regarding: 4-5 MONTH F/U

## 2019-12-08 LAB — COMPLETE METABOLIC PANEL WITH GFR
AG Ratio: 1.7 (calc) (ref 1.0–2.5)
ALT: 64 U/L — ABNORMAL HIGH (ref 6–29)
AST: 47 U/L — ABNORMAL HIGH (ref 10–35)
Albumin: 4.1 g/dL (ref 3.6–5.1)
Alkaline phosphatase (APISO): 83 U/L (ref 37–153)
BUN: 12 mg/dL (ref 7–25)
CO2: 32 mmol/L (ref 20–32)
Calcium: 8.7 mg/dL (ref 8.6–10.4)
Chloride: 103 mmol/L (ref 98–110)
Creat: 0.73 mg/dL (ref 0.50–0.99)
GFR, Est African American: 100 mL/min/{1.73_m2} (ref >=60)
GFR, Est Non African American: 86 mL/min/{1.73_m2} (ref >=60)
Globulin: 2.4 g/dL (calc) (ref 1.9–3.7)
Glucose, Bld: 77 mg/dL (ref 65–99)
Potassium: 3.7 mmol/L (ref 3.5–5.3)
Sodium: 142 mmol/L (ref 135–146)
Total Bilirubin: 0.5 mg/dL (ref 0.2–1.2)
Total Protein: 6.5 g/dL (ref 6.1–8.1)

## 2019-12-08 LAB — CBC WITH DIFFERENTIAL/PLATELET
Absolute Monocytes: 532 cells/uL (ref 200–950)
Basophils Absolute: 20 cells/uL (ref 0–200)
Basophils Relative: 0.5 %
Eosinophils Absolute: 120 cells/uL (ref 15–500)
Eosinophils Relative: 3 %
HCT: 40.2 % (ref 35.0–45.0)
Hemoglobin: 14 g/dL (ref 11.7–15.5)
Lymphs Abs: 1568 cells/uL (ref 850–3900)
MCH: 31.3 pg (ref 27.0–33.0)
MCHC: 34.8 g/dL (ref 32.0–36.0)
MCV: 89.9 fL (ref 80.0–100.0)
MPV: 10.8 fL (ref 7.5–12.5)
Monocytes Relative: 13.3 %
Neutro Abs: 1760 cells/uL (ref 1500–7800)
Neutrophils Relative %: 44 %
Platelets: 248 10*3/uL (ref 140–400)
RBC: 4.47 10*6/uL (ref 3.80–5.10)
RDW: 13.4 % (ref 11.0–15.0)
Total Lymphocyte: 39.2 %
WBC: 4 10*3/uL (ref 3.8–10.8)

## 2019-12-12 NOTE — Progress Notes (Signed)
CBC is within normal limits.  She has elevated LFTs which are stable.  She is on methotrexate and a statin combination which contributes to elevated LFTs.  We will continue to monitor labs.  Please advise patient to avoid all NSAIDs and Tylenol.

## 2020-01-07 ENCOUNTER — Other Ambulatory Visit: Payer: Self-pay | Admitting: Rheumatology

## 2020-01-09 NOTE — Telephone Encounter (Signed)
Last Visit: 12/05/19 Next Visit:04/24/20 Labs: 12/07/19 CBC is within normal limits. She has elevated LFTs which are stable  Okay to refill per Dr. Estanislado Pandy

## 2020-01-11 ENCOUNTER — Telehealth: Payer: Self-pay | Admitting: Rheumatology

## 2020-01-11 NOTE — Telephone Encounter (Signed)
Attempted to contact the patient and left message to advise patient prescription for MTX was sent to the pharmacy on 01/09/20. Advised patient to call the office if she has any trouble getting prescription.

## 2020-01-11 NOTE — Telephone Encounter (Signed)
Patient request a refill on MTX sent to [facility] on [address].

## 2020-03-30 ENCOUNTER — Other Ambulatory Visit: Payer: Self-pay | Admitting: Rheumatology

## 2020-03-30 ENCOUNTER — Other Ambulatory Visit: Payer: Self-pay

## 2020-03-30 DIAGNOSIS — M3219 Other organ or system involvement in systemic lupus erythematosus: Secondary | ICD-10-CM

## 2020-03-30 DIAGNOSIS — Z79899 Other long term (current) drug therapy: Secondary | ICD-10-CM

## 2020-03-30 NOTE — Telephone Encounter (Signed)
Last Visit: 12/05/2019 telemedicine  Next Visit: 04/24/2020 Labs: 12/07/2019 CBC is within normal limits. She has elevated LFTs which are stable. She is on methotrexate and a statin combination which contributes to elevated LFTs. We will continue to monitor labs.   Advised patient she is due to update labs, patient states she will come by the office today to update.

## 2020-04-02 LAB — URINALYSIS, ROUTINE W REFLEX MICROSCOPIC
Bilirubin Urine: NEGATIVE
Glucose, UA: NEGATIVE
Hgb urine dipstick: NEGATIVE
Ketones, ur: NEGATIVE
Leukocytes,Ua: NEGATIVE
Nitrite: NEGATIVE
Protein, ur: NEGATIVE
Specific Gravity, Urine: 1.022 (ref 1.001–1.03)
pH: 7 (ref 5.0–8.0)

## 2020-04-02 LAB — COMPLETE METABOLIC PANEL WITH GFR
AG Ratio: 1.8 (calc) (ref 1.0–2.5)
ALT: 39 U/L — ABNORMAL HIGH (ref 6–29)
AST: 40 U/L — ABNORMAL HIGH (ref 10–35)
Albumin: 3.9 g/dL (ref 3.6–5.1)
Alkaline phosphatase (APISO): 67 U/L (ref 37–153)
BUN: 15 mg/dL (ref 7–25)
CO2: 34 mmol/L — ABNORMAL HIGH (ref 20–32)
Calcium: 9.1 mg/dL (ref 8.6–10.4)
Chloride: 100 mmol/L (ref 98–110)
Creat: 0.72 mg/dL (ref 0.50–0.99)
GFR, Est African American: 102 mL/min/{1.73_m2} (ref >=60)
GFR, Est Non African American: 88 mL/min/{1.73_m2} (ref >=60)
Globulin: 2.2 g/dL (calc) (ref 1.9–3.7)
Glucose, Bld: 142 mg/dL — ABNORMAL HIGH (ref 65–99)
Potassium: 3.2 mmol/L — ABNORMAL LOW (ref 3.5–5.3)
Sodium: 142 mmol/L (ref 135–146)
Total Bilirubin: 0.4 mg/dL (ref 0.2–1.2)
Total Protein: 6.1 g/dL (ref 6.1–8.1)

## 2020-04-02 LAB — C3 AND C4
C3 Complement: 157 mg/dL (ref 83–193)
C4 Complement: 30 mg/dL (ref 15–57)

## 2020-04-02 LAB — CBC WITH DIFFERENTIAL/PLATELET
Absolute Monocytes: 412 cells/uL (ref 200–950)
Basophils Absolute: 20 cells/uL (ref 0–200)
Basophils Relative: 0.5 %
Eosinophils Absolute: 200 cells/uL (ref 15–500)
Eosinophils Relative: 5 %
HCT: 40.3 % (ref 35.0–45.0)
Hemoglobin: 13.9 g/dL (ref 11.7–15.5)
Lymphs Abs: 1244 cells/uL (ref 850–3900)
MCH: 31.2 pg (ref 27.0–33.0)
MCHC: 34.5 g/dL (ref 32.0–36.0)
MCV: 90.6 fL (ref 80.0–100.0)
MPV: 10.6 fL (ref 7.5–12.5)
Monocytes Relative: 10.3 %
Neutro Abs: 2124 cells/uL (ref 1500–7800)
Neutrophils Relative %: 53.1 %
Platelets: 242 10*3/uL (ref 140–400)
RBC: 4.45 10*6/uL (ref 3.80–5.10)
RDW: 13.1 % (ref 11.0–15.0)
Total Lymphocyte: 31.1 %
WBC: 4 10*3/uL (ref 3.8–10.8)

## 2020-04-02 LAB — SEDIMENTATION RATE: Sed Rate: 11 mm/h (ref 0–30)

## 2020-04-02 LAB — ANTI-DNA ANTIBODY, DOUBLE-STRANDED: ds DNA Ab: <1 IU/mL

## 2020-04-02 LAB — ANTI-NUCLEAR AB-TITER (ANA TITER): ANA Titer 1: 1:640 {titer} — ABNORMAL HIGH

## 2020-04-02 LAB — ANA: Anti Nuclear Antibody (ANA): POSITIVE — AB

## 2020-04-02 NOTE — Telephone Encounter (Signed)
Labs: 03/30/20 AST 40 ALT 39 CO2 34 Potassium 3.2  Glucose 142 CBC WNL  Okay to refill MTX?

## 2020-04-02 NOTE — Telephone Encounter (Signed)
Ok to refill MTX 0.3 ml sq once weekly.

## 2020-04-02 NOTE — Progress Notes (Signed)
ESR WNL. Complements WNL.  DsDNA is negative.  ANA titer stable.  Labs are not consistent with a lupus flare.  UA normal. CBC WNL.  LFTs are elevated but stable.  She is taking low dose MTX. Glucose is elevated-142.  Please notify the patient.

## 2020-04-03 ENCOUNTER — Telehealth: Payer: Self-pay

## 2020-04-03 MED ORDER — METHOTREXATE SODIUM CHEMO INJECTION 50 MG/2ML: 7.5000 mg | INTRAMUSCULAR | 0 refills | Status: DC

## 2020-04-03 NOTE — Telephone Encounter (Signed)
Patient states she has changed pharmacies and would like the methotrexate prescription sent to [facility] on [address].   Last Visit: 12/05/2019 telemedicine  Next Visit: 04/24/2020 Labs: 03/30/2020 ESR WNL. Complements WNL. DsDNA is negative. ANA titer stable. Labs are not consistent with a lupus flare.  UA normal. CBC WNL. LFTs are elevated but stable. She is taking low dose MTX. Glucose is elevated-142.  Okay to refill per Hazel Sams, PA-C. (see refill request from 03/30/2020).   [facility] and [facility] prescription.

## 2020-04-17 NOTE — Progress Notes (Signed)
Office Visit Note  Patient: Deborah Levy             Date of Birth: 01-19-1956           MRN: [id number]             PCP: Serita Grit, PA-C Referring: Serita Grit, PA* Visit Date: 04/24/2020 Occupation: @GUAROCC @  Subjective:  Arthralgias   History of Present Illness: Deborah Levy is a 64 y.o. female with history of systemic lupus erythematous, osteoarthritis, and fibromyalgia.  She is on MTX 0.3 ml sq once weekly and folic acid 1 mg po daily. She denies any recent systemic or cutaneous lupus flares. She reports increased arthralgias and morning stiffness in multiple joints.  She denies any joint swelling.  She has generalized myalgias and muscle tenderness due to fibromyalgia.  She continues to take lyrica as prescribed and belbuca for pain relief.  She had a recent epidural injection in February by her pain management specialist. She denies any recent rashes.  She tries to avoid sun exposure.  She denies any oral or nasal ulcerations.  She has chronic sicca symptoms.    Activities of Daily Living:  Patient reports morning stiffness for several hours.   Patient Denies nocturnal pain.  Difficulty dressing/grooming: Denies Difficulty climbing stairs: Reports Difficulty getting out of chair: Denies Difficulty using hands for taps, buttons, cutlery, and/or writing: Denies  Review of Systems  Constitutional: Positive for fatigue.  HENT: Positive for mouth dryness. Negative for mouth sores and nose dryness.   Eyes: Positive for dryness. Negative for pain, itching and visual disturbance.  Respiratory: Negative for cough, hemoptysis, shortness of breath and difficulty breathing.   Cardiovascular: Negative for chest pain, palpitations, hypertension and swelling in legs/feet.  Gastrointestinal: Positive for constipation. Negative for blood in stool and diarrhea.  Endocrine: Negative for increased urination.  Genitourinary: Negative for difficulty urinating and  painful urination.  Musculoskeletal: Positive for arthralgias, joint pain and morning stiffness. Negative for joint swelling, myalgias, muscle weakness, muscle tenderness and myalgias.  Skin: Negative for color change, pallor, rash, hair loss, nodules/bumps, redness, skin tightness, ulcers and sensitivity to sunlight.  Allergic/Immunologic: Negative for susceptible to infections.  Neurological: Negative for numbness and weakness.  Hematological: Negative for swollen glands.  Psychiatric/Behavioral: Negative for depressed mood, confusion and sleep disturbance. The patient is not nervous/anxious.     PMFS History:  Patient Active Problem List   Diagnosis Date Noted  . 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 History of gastroesophageal reflux (GERD) 07/01/2017  . Other fatigue 03/23/2017  . Primary insomnia 03/23/2017  . History of vitamin D deficiency 03/23/2017  . High risk medication use 10/18/2016  . 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 Type 2 diabetes mellitus ([place]) 11/22/2014  . Chronic pain 11/13/2014  . Fibromyalgia 11/13/2014  . Rotator cuff impingement syndrome 11/13/2014  . Cannot sleep 02/13/2014  . Essential (primary) hypertension 09/07/2013  . HLD (hyperlipidemia) 09/07/2013  . Adiposity 09/07/2013  . Systemic lupus erythematosus ([place]) 05/21/2013  . Hypokalemia 04/21/2013  . Breast cancer of lower-inner quadrant of left female breast ([place]) 01/12/2012    Past Medical History:  Diagnosis Date  . Breast cancer ([place])   . Cancer ([place])   . Fibromyalgia   . GERD (gastroesophageal reflux disease)   . Hypertension   . Lupus ([place])     Family History  Problem Relation Age of Onset  . Cancer Father  colon  . Hypertension Father   . Heart disease Father   . Cancer Sister        Cervical  . Cancer Brother        prostate   . Heart disease Maternal Grandfather   . Hypertension Paternal Grandmother   . Heart disease Paternal  Grandmother   . Breast cancer Neg Hx    Past Surgical History:  Procedure Laterality Date  . BREAST LUMPECTOMY Left 2012  . BREAST SURGERY  03/10/2011   Lt br lumpectomy  . COLONOSCOPY     Removed 2 polpys   . KNEE SURGERY    . SHOULDER SURGERY    . TUBAL LIGATION     Social History   Social History Narrative  . Not on file    There is no immunization history on file for this patient.   Objective: Vital Signs: BP 133/85 (BP Location: Left Arm, Patient Position: Sitting, Cuff Size: Normal)   Pulse 73   Resp 15   Ht 5' (1.524 m)   Wt 179 lb 3.2 oz (81.3 kg)   BMI 35.00 kg/m    Physical Exam Vitals and nursing note reviewed.  Constitutional:      Appearance: She is well-developed.  HENT:     Head: Normocephalic and atraumatic.  Eyes:     Conjunctiva/sclera: Conjunctivae normal.  Pulmonary:     Effort: Pulmonary effort is normal.     Breath sounds: Normal breath sounds.  Abdominal:     General: Bowel sounds are normal.     Palpations: Abdomen is soft.  Musculoskeletal:     Cervical back: Normal range of motion.  Lymphadenopathy:     Cervical: No cervical adenopathy.  Skin:    General: Skin is warm and dry.     Capillary Refill: Capillary refill takes less than 2 seconds.  Neurological:     Mental Status: She is alert and oriented to person, place, and time.  Psychiatric:        Behavior: Behavior normal.      Musculoskeletal Exam: C-spine, thoracic spine, and lumbar spine good ROM.  Shoulder joints, elbow joints, wrist joints, MCPs, PIPs, and DIPs good ROM with no synovitis.  Complete fist formation bilaterally.  Hip joints, knee joints, ankle joints, MTPs, PIPs, and DIPs good ROM with no synovitis.  No warmth or effusion of knee joints.  No tenderness or swelling of ankle joints.   CDAI Exam: CDAI Score: -- Patient Global: --; Provider Global: -- Swollen: --; Tender: -- Joint Exam 04/24/2020   No joint exam has been documented for this visit   There is  currently no information documented on the homunculus. Go to the Rheumatology activity and complete the homunculus joint exam.  Investigation: No additional findings.  Imaging: No results found.  Recent Labs: Lab Results  Component Value Date   WBC 4.0 03/30/2020   HGB 13.9 03/30/2020   PLT 242 03/30/2020   NA 142 03/30/2020   K 3.2 (L) 03/30/2020   CL 100 03/30/2020   CO2 34 (H) 03/30/2020   GLUCOSE 142 (H) 03/30/2020   BUN 15 03/30/2020   CREATININE 0.72 03/30/2020   BILITOT 0.4 03/30/2020   ALKPHOS 86 07/01/2017   AST 40 (H) 03/30/2020   ALT 39 (H) 03/30/2020   PROT 6.1 03/30/2020   ALBUMIN 4.3 07/01/2017   CALCIUM 9.1 03/30/2020   GFRAA 102 03/30/2020    Speciality Comments: No specialty comments available.  Procedures:  No procedures performed Allergies: Sulfa  antibiotics, Metformin, and Sulfasalazine   Assessment / Plan:     Visit Diagnoses: Other systemic lupus erythematosus with other organ involvement (HCC) -  Fatigue, arthralgia, malar rash, photosensitivity, positive subcutaneous lupus on biopsy, Positive ANA, Ro+ and La +: She has not had any signs or symptoms of a systemic or cutaneous lupus flare recently.  She is clinically doing well on MTX 0.3 ml sq injections once weekly and folic acid 1 mg po daily.  She continues to have arthralgias and morning stiffness but no synovitis was noted on exam.  She has not had any recent rashes.  She was encouraged to avoid direct sun exposure and to wear sunscreen spf>50 on a daily basis.  She has not had any recent oral or nasal ulcerations.  She continues to have chronic sicca symptoms.  She will continue on MTX and folic acid as prescribed.  She was advised to notify us if she develops any signs or symptoms of a flare.  Future orders for autoimmune lab work was placed today.  - Plan: Urinalysis, Routine w reflex microscopic, Anti-DNA antibody, double-stranded, C3 and C4, Sedimentation rate, ANA, Sjogrens syndrome-A  extractable nuclear antibody, Sjogrens syndrome-B extractable nuclear antibody  High risk medication use - MTX 0.3 ml sq once weekly and folic acid 1 mg po daily. Reduced dose of MTX due to elevated LFTs.  CBC and CMP were drawn on 03/30/20.  She is due to update lab work in July and every 3 months.  Standing orders for CBC and CMP are in place.  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  The importance of sunscreen use was discussed.  She will continue on MTX as prescribed.   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  No warmth or effusion of the left knee joint noted.   Fibromyalgia: She continues to have generalized myalgias and arthralgias due to fibromyalgia.  She is taking lyrica as prescribed. She takes belbuca for pain relief and is followed closely by pain management.   Other fatigue: She has chronic fatigue secondary to insomnia.  Primary insomnia: She takes ambien 10 mg po at bedtime for insomnia.   History of vitamin D deficiency  Other medical conditions are listed as follows:   History of hypertension  History of hyperlipidemia  History of diabetes mellitus  History of gastroesophageal reflux (GERD)  History of breast cancer  History of depression  Orders: Orders Placed This Encounter  Procedures  . Urinalysis, Routine w reflex microscopic  . Anti-DNA antibody, double-stranded  . C3 and C4  . Sedimentation rate  . ANA  . Sjogrens syndrome-A extractable nuclear antibody  . Sjogrens syndrome-B extractable nuclear antibody   No orders of the defined types were placed in this encounter.    Follow-Up Instructions: Return in about 5 months (around 09/24/2020) for Systemic lupus erythematosus, Osteoarthritis, Fibromyalgia.   Hazel Sams, PA-C  I examined and evaluated the patient with Hazel Sams PA.  Patient is clinically doing well.  She had no synovitis  on examination.  She has not had any skin rash.  She will continue current treatment.  The plan of care was discussed as noted above.  Bo Merino, MD  Note - This record has been created using Editor, commissioning.  Chart creation errors have been sought, but may not always  have been located. Such creation errors do not reflect on  the standard of medical  care.

## 2020-04-24 ENCOUNTER — Encounter: Payer: Self-pay | Admitting: Rheumatology

## 2020-04-24 ENCOUNTER — Ambulatory Visit (INDEPENDENT_AMBULATORY_CARE_PROVIDER_SITE_OTHER): Payer: BC Managed Care – PPO | Admitting: Rheumatology

## 2020-04-24 ENCOUNTER — Other Ambulatory Visit: Payer: Self-pay

## 2020-04-24 VITALS — BP 133/85 | HR 73 | Resp 15 | Ht 60.0 in | Wt 179.2 lb

## 2020-04-24 DIAGNOSIS — Z853 Personal history of malignant neoplasm of breast: Secondary | ICD-10-CM

## 2020-04-24 DIAGNOSIS — Z8639 Personal history of other endocrine, nutritional and metabolic disease: Secondary | ICD-10-CM

## 2020-04-24 DIAGNOSIS — F5101 Primary insomnia: Secondary | ICD-10-CM

## 2020-04-24 DIAGNOSIS — M1712 Unilateral primary osteoarthritis, left knee: Secondary | ICD-10-CM

## 2020-04-24 DIAGNOSIS — Z8659 Personal history of other mental and behavioral disorders: Secondary | ICD-10-CM

## 2020-04-24 DIAGNOSIS — L932 Other local lupus erythematosus: Secondary | ICD-10-CM | POA: Diagnosis not present

## 2020-04-24 DIAGNOSIS — M3219 Other organ or system involvement in systemic lupus erythematosus: Secondary | ICD-10-CM | POA: Diagnosis not present

## 2020-04-24 DIAGNOSIS — H35389 Toxic maculopathy, unspecified eye: Secondary | ICD-10-CM

## 2020-04-24 DIAGNOSIS — R5383 Other fatigue: Secondary | ICD-10-CM

## 2020-04-24 DIAGNOSIS — Z79899 Other long term (current) drug therapy: Secondary | ICD-10-CM

## 2020-04-24 DIAGNOSIS — Z8679 Personal history of other diseases of the circulatory system: Secondary | ICD-10-CM

## 2020-04-24 DIAGNOSIS — Z8719 Personal history of other diseases of the digestive system: Secondary | ICD-10-CM

## 2020-04-24 DIAGNOSIS — M797 Fibromyalgia: Secondary | ICD-10-CM

## 2020-04-24 DIAGNOSIS — T372X5A Adverse effect of antimalarials and drugs acting on other blood protozoa, initial encounter: Secondary | ICD-10-CM

## 2020-04-24 NOTE — Patient Instructions (Signed)
Standing Labs We placed an order today for your standing lab work.    Please come back and get your standing labs in July and every 3 months  We have open lab daily Monday through Thursday from 8:30-12:30 PM and 1:30-4:30 PM and Friday from 8:30-12:30 PM and 1:30-4:00 PM at the office of Dr. Shaili Deveshwar.   You may experience shorter wait times on Monday and Friday afternoons. The office is located at [address], [address], [place], [place] [address] No appointment is necessary.   Labs are drawn by [facility].  You may receive a bill from [facility] for your lab work.  If you wish to have your labs drawn at another location, please call the office 24 hours in advance to send orders.  If you have any questions regarding directions or hours of operation,  please call [phone].   Just as a reminder please drink plenty of water prior to coming for your lab work. Thanks!  

## 2020-05-11 ENCOUNTER — Telehealth: Payer: Self-pay | Admitting: Rheumatology

## 2020-05-11 ENCOUNTER — Other Ambulatory Visit: Payer: Self-pay | Admitting: Obstetrics and Gynecology

## 2020-05-11 DIAGNOSIS — Z1231 Encounter for screening mammogram for malignant neoplasm of breast: Secondary | ICD-10-CM

## 2020-05-11 MED ORDER — TUBERCULIN SYRINGE 27G X 1/2" 1 ML MISC
12.0000 | 3 refills | Status: AC
Start: 2020-05-11 — End: ?

## 2020-05-11 NOTE — Telephone Encounter (Signed)
Last Visit: 04/24/2020 Next visit: 09/25/2020  Okay to refill per Dr. Estanislado Pandy   Left message to advise patient prescription to the pharmacy on file.

## 2020-05-11 NOTE — Telephone Encounter (Signed)
Patient left a voicemail stating the new syringes she picked up are larger than the ones in the past and hurt when she uses them.  Patient is requesting a new prescription for smaller syringes.

## 2020-06-22 ENCOUNTER — Other Ambulatory Visit: Payer: Self-pay | Admitting: *Deleted

## 2020-06-22 MED ORDER — METHOTREXATE SODIUM CHEMO INJECTION 50 MG/2ML: 7.5000 mg | INTRAMUSCULAR | 0 refills | Status: DC

## 2020-06-22 NOTE — Telephone Encounter (Signed)
Refill request received via fax  Last Visit: 04/24/2020 Next Visit: 09/25/2020 Labs: 03/30/2020 CBC WNL. LFTs are elevated but stable. She is taking low dose MTX. Glucose is elevated-142.   Current Dose per office note 04/24/2020: - MTX 0.3 ml sq once weekly  DX:  Other systemic lupus erythematosus with other organ involvement   Left message to advise patient she is due to update labs this month.  Okay to refill MTX?

## 2020-07-03 ENCOUNTER — Ambulatory Visit
Admission: RE | Admit: 2020-07-03 | Discharge: 2020-07-03 | Disposition: A | Discharge status: Home / Self Care | Payer: BC Managed Care – PPO | Source: Ambulatory Visit | Attending: Obstetrics and Gynecology | Admitting: Obstetrics and Gynecology

## 2020-07-03 ENCOUNTER — Other Ambulatory Visit: Payer: Self-pay

## 2020-07-03 DIAGNOSIS — Z1231 Encounter for screening mammogram for malignant neoplasm of breast: Secondary | ICD-10-CM

## 2020-07-20 ENCOUNTER — Other Ambulatory Visit: Payer: Self-pay | Admitting: Rheumatology

## 2020-07-20 ENCOUNTER — Telehealth: Payer: Self-pay | Admitting: *Deleted

## 2020-07-20 NOTE — Telephone Encounter (Signed)
Last Visit: 04/24/2020 Next Visit: 09/25/2020  Okay to refill per Dr. Estanislado Pandy

## 2020-07-20 NOTE — Telephone Encounter (Signed)
error 

## 2020-09-11 NOTE — Progress Notes (Signed)
Office Visit Note  Patient: Deborah Levy             Date of Birth: 01-25-1956           MRN: [id number]             PCP: Serita Grit, PA-C Referring: Serita Grit, PA* Visit Date: 09/25/2020 Occupation: @GUAROCC @  Subjective:  Lupus (Doing good)   History of Present Illness: Deborah Levy is a 64 y.o. female with history of systemic lupus erythematosus and fibromyalgia.  She states she has not had any subcutaneous lupus rash in a while.  She continues to have some fatigue, arthralgias and morning stiffness.  She also complains of sicca symptoms.  She continues to have some generalized pain from fibromyalgia.  Activities of Daily Living:  Patient reports morning stiffness for 1.5 hours.   Patient Denies nocturnal pain.  Difficulty dressing/grooming: Denies Difficulty climbing stairs: Denies Difficulty getting out of chair: Denies Difficulty using hands for taps, buttons, cutlery, and/or writing: Denies  Review of Systems  Constitutional: Positive for fatigue. Negative for night sweats, weight gain and weight loss.  HENT: Positive for mouth dryness. Negative for mouth sores, trouble swallowing, trouble swallowing and nose dryness.   Eyes: Positive for dryness. Negative for pain, redness and visual disturbance.  Respiratory: Negative for cough, shortness of breath and difficulty breathing.   Cardiovascular: Positive for swelling in legs/feet. Negative for chest pain, palpitations, hypertension and irregular heartbeat.  Gastrointestinal: Positive for constipation. Negative for blood in stool and diarrhea.  Endocrine: Positive for heat intolerance. Negative for increased urination.  Genitourinary: Negative for difficulty urinating and vaginal dryness.  Musculoskeletal: Positive for arthralgias, joint pain, morning stiffness and muscle tenderness. Negative for joint swelling, myalgias, muscle weakness and myalgias.  Skin: Negative for color change, rash, hair  loss, skin tightness, ulcers and sensitivity to sunlight.  Allergic/Immunologic: Negative for susceptible to infections.  Neurological: Positive for weakness. Negative for dizziness, memory loss and night sweats.  Hematological: Positive for bruising/bleeding tendency. Negative for swollen glands.  Psychiatric/Behavioral: Positive for depressed mood. Negative for sleep disturbance. The patient is not nervous/anxious.        Situational due to loss of her brother    PMFS History:  Patient Active Problem List   Diagnosis Date Noted  . 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 History of gastroesophageal reflux (GERD) 07/01/2017  . Other fatigue 03/23/2017  . Primary insomnia 03/23/2017  . History of vitamin D deficiency 03/23/2017  . High risk medication use 10/18/2016  . 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 Type 2 diabetes mellitus ([place]) 11/22/2014  . Chronic pain 11/13/2014  . Fibromyalgia 11/13/2014  . Rotator cuff impingement syndrome 11/13/2014  . Cannot sleep 02/13/2014  . Essential (primary) hypertension 09/07/2013  . HLD (hyperlipidemia) 09/07/2013  . Adiposity 09/07/2013  . Systemic lupus erythematosus ([place]) 05/21/2013  . Hypokalemia 04/21/2013  . Breast cancer of lower-inner quadrant of left female breast ([place]) 01/12/2012    Past Medical History:  Diagnosis Date  . Breast cancer ([place])   . Cancer ([place])   . Fibromyalgia   . GERD (gastroesophageal reflux disease)   . Hypertension   . Lupus ([place])     Family History  Problem Relation Age of Onset  . Cancer Father        colon  . Hypertension Father   . Heart disease Father   . Cancer Sister  Cervical  . Cancer Brother        prostate   . Heart disease Maternal Grandfather   . Hypertension Paternal Grandmother   . Heart disease Paternal Grandmother   . Breast cancer Neg Hx    Past Surgical History:  Procedure Laterality Date  . BREAST LUMPECTOMY Left 2012  . BREAST  SURGERY  03/10/2011   Lt br lumpectomy  . COLONOSCOPY     Removed 2 polpys   . KNEE SURGERY    . SHOULDER SURGERY    . TUBAL LIGATION     Social History   Social History Narrative  . Not on file   Immunization History  Administered Date(s) Administered  . PFIZER SARS-COV-2 Vaccination 03/04/2020, 03/25/2020     Objective: Vital Signs: BP (!) 157/78 (BP Location: Left Arm, Patient Position: Sitting, Cuff Size: Normal)   Pulse (!) 57   Resp 15   Ht 4' 10.5" (1.486 m)   Wt 168 lb (76.2 kg)   BMI 34.51 kg/m    Physical Exam Vitals and nursing note reviewed.  Constitutional:      Appearance: She is well-developed.  HENT:     Head: Normocephalic and atraumatic.  Eyes:     Conjunctiva/sclera: Conjunctivae normal.  Cardiovascular:     Rate and Rhythm: Normal rate and regular rhythm.     Heart sounds: Normal heart sounds.  Pulmonary:     Effort: Pulmonary effort is normal.     Breath sounds: Normal breath sounds.  Abdominal:     General: Bowel sounds are normal.     Palpations: Abdomen is soft.  Musculoskeletal:     Cervical back: Normal range of motion.  Lymphadenopathy:     Cervical: No cervical adenopathy.  Skin:    General: Skin is warm and dry.     Capillary Refill: Capillary refill takes less than 2 seconds.  Neurological:     Mental Status: She is alert and oriented to person, place, and time.  Psychiatric:        Behavior: Behavior normal.      Musculoskeletal Exam: C-spine was in good range of motion.  Shoulder joints, elbow joints, wrist joints, MCPs PIPs and DIPs with good range of motion with no synovitis.  Hip joints, knee joints, ankles, MTPs and PIPs with good range of motion with no synovitis.  CDAI Exam: CDAI Score: -- Patient Global: --; Provider Global: -- Swollen: --; Tender: -- Joint Exam 09/25/2020   No joint exam has been documented for this visit   There is currently no information documented on the homunculus. Go to the Rheumatology  activity and complete the homunculus joint exam.  Investigation: No additional findings.  Imaging: No results found.  Recent Labs: Lab Results  Component Value Date   WBC 3.7 (L) 09/24/2020   HGB 13.2 09/24/2020   PLT 277 09/24/2020   NA 141 09/24/2020   K 4.4 09/24/2020   CL 103 09/24/2020   CO2 33 (H) 09/24/2020   GLUCOSE 102 (H) 09/24/2020   BUN 13 09/24/2020   CREATININE 0.73 09/24/2020   BILITOT 0.4 09/24/2020   ALKPHOS 86 07/01/2017   AST 26 09/24/2020   ALT 28 09/24/2020   PROT 6.5 09/24/2020   ALBUMIN 4.3 07/01/2017   CALCIUM 9.3 09/24/2020   GFRAA 99 09/24/2020   September 24, 2020 C3-C4 normal, Ro positive, La negative, ESR 14, ANA pending, dsDNA negative, UA showed trace leukocytes.  Speciality Comments: No specialty comments available.  Procedures:  No procedures  performed Allergies: Sulfa antibiotics, Escitalopram, Metformin, and Sulfasalazine   Assessment / Plan:     Visit Diagnoses: Other systemic lupus erythematosus with other organ involvement (HCC) - Fatigue, arthralgia, malar rash, photosensitivity, positive subcutaneous lupus on biopsy, Positive ANA, Ro+ and La +: The labs are stable.  ANA is pending.  Her Ro antibody is positive.  Sed rate and complements are normal.  She continues to have some joint stiffness.  She denies any recent rash.  High risk medication use - MTX 0.3 ml sq once weekly and folic acid 1 mg po daily. Reduced dose of MTX due to elevated LFTs.  Repeat LFTs are normal on the reduced dose.  We will continue current dose.  She will get labs in 3 months and then every 3 months to monitor for drug toxicity.  Cutaneous lupus erythematosus-use of sunscreen was emphasized.  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  Weight loss and exercise was discussed.  Fibromyalgia-she has generalized pain from fibromyalgia.  Primary insomnia - ambien 10 mg  po at bedtime for insomnia.   Other fatigue-she has fatigue due to chronic insomnia, fibromyalgia and due to increased depression.  History of vitamin D deficiency  History of hyperlipidemia  History of hypertension-her blood pressure is elevated.  She has been going through a lot of stress.  Have advised her to monitor her blood pressure.  History of gastroesophageal reflux (GERD)  History of diabetes mellitus  History of depression-she lost her younger brother recently and she had to start some antidepressant.  History of breast cancer  Educated about COVID-19 virus infection-she is fully vaccinated against COVID-19.  Use of booster was discussed.  Use of mask, social distancing and hand hygiene was discussed.  I also discussed use of monoclonal antibody infusion in case she develops COVID-19 infection.  Orders: No orders of the defined types were placed in this encounter.  No orders of the defined types were placed in this encounter. .  Follow-Up Instructions: Return in about 5 months (around 02/23/2021) for Systemic lupus.   Bo Merino, MD  Note - This record has been created using Editor, commissioning.  Chart creation errors have been sought, but may not always  have been located. Such creation errors do not reflect on  the standard of medical care.

## 2020-09-24 ENCOUNTER — Other Ambulatory Visit: Payer: Self-pay | Admitting: *Deleted

## 2020-09-24 DIAGNOSIS — M3219 Other organ or system involvement in systemic lupus erythematosus: Secondary | ICD-10-CM

## 2020-09-24 DIAGNOSIS — Z79899 Other long term (current) drug therapy: Secondary | ICD-10-CM

## 2020-09-25 ENCOUNTER — Ambulatory Visit (INDEPENDENT_AMBULATORY_CARE_PROVIDER_SITE_OTHER): Payer: BC Managed Care – PPO | Admitting: Rheumatology

## 2020-09-25 ENCOUNTER — Encounter: Payer: Self-pay | Admitting: Physician Assistant

## 2020-09-25 ENCOUNTER — Encounter: Payer: Self-pay | Admitting: Rheumatology

## 2020-09-25 ENCOUNTER — Other Ambulatory Visit: Payer: Self-pay

## 2020-09-25 VITALS — BP 157/78 | HR 57 | Resp 15 | Ht 58.5 in | Wt 168.0 lb

## 2020-09-25 DIAGNOSIS — M3219 Other organ or system involvement in systemic lupus erythematosus: Secondary | ICD-10-CM | POA: Diagnosis not present

## 2020-09-25 DIAGNOSIS — F5101 Primary insomnia: Secondary | ICD-10-CM

## 2020-09-25 DIAGNOSIS — L932 Other local lupus erythematosus: Secondary | ICD-10-CM

## 2020-09-25 DIAGNOSIS — Z8639 Personal history of other endocrine, nutritional and metabolic disease: Secondary | ICD-10-CM

## 2020-09-25 DIAGNOSIS — Z79899 Other long term (current) drug therapy: Secondary | ICD-10-CM

## 2020-09-25 DIAGNOSIS — M1712 Unilateral primary osteoarthritis, left knee: Secondary | ICD-10-CM

## 2020-09-25 DIAGNOSIS — Z8659 Personal history of other mental and behavioral disorders: Secondary | ICD-10-CM

## 2020-09-25 DIAGNOSIS — Z7189 Other specified counseling: Secondary | ICD-10-CM

## 2020-09-25 DIAGNOSIS — Z8719 Personal history of other diseases of the digestive system: Secondary | ICD-10-CM

## 2020-09-25 DIAGNOSIS — Z853 Personal history of malignant neoplasm of breast: Secondary | ICD-10-CM

## 2020-09-25 DIAGNOSIS — R5383 Other fatigue: Secondary | ICD-10-CM

## 2020-09-25 DIAGNOSIS — T372X5A Adverse effect of antimalarials and drugs acting on other blood protozoa, initial encounter: Secondary | ICD-10-CM

## 2020-09-25 DIAGNOSIS — H35389 Toxic maculopathy, unspecified eye: Secondary | ICD-10-CM | POA: Diagnosis not present

## 2020-09-25 DIAGNOSIS — M797 Fibromyalgia: Secondary | ICD-10-CM

## 2020-09-25 DIAGNOSIS — Z8679 Personal history of other diseases of the circulatory system: Secondary | ICD-10-CM

## 2020-09-25 NOTE — Patient Instructions (Signed)
Standing Labs We placed an order today for your standing lab work.   Please have your standing labs drawn in January and every 3 months  If possible, please have your labs drawn 2 weeks prior to your appointment so that the provider can discuss your results at your appointment.  We have open lab daily Monday through Thursday from 8:30-12:30 PM and 1:30-4:30 PM and Friday from 8:30-12:30 PM and 1:30-4:00 PM at the office of Dr. Aloria Looper, [facility].   Please be advised, patients with office appointments requiring lab work will take precedents over walk-in lab work.  If possible, please come for your lab work on Monday and Friday afternoons, as you may experience shorter wait times. The office is located at [address], [address], [place], [place] [address] No appointment is necessary.   Labs are drawn by [facility]. Please bring your co-pay at the time of your lab draw.  You may receive a bill from [facility] for your lab work.  If you wish to have your labs drawn at another location, please call the office 24 hours in advance to send orders.  If you have any questions regarding directions or hours of operation,  please call [phone].   As a reminder, please drink plenty of water prior to coming for your lab work. Thanks!   COVID-19 vaccine recommendations:   COVID-19 vaccine is recommended for everyone (unless you are allergic to a vaccine component), even if you are on a medication that suppresses your immune system.   If you are on Methotrexate, Cellcept (mycophenolate), Rinvoq, Xeljanz, and Olumiant- hold the medication for 1 week after each vaccine. Hold Methotrexate for 2 weeks after the single dose COVID-19 vaccine.   Do not take Tylenol or any anti-inflammatory medications (NSAIDs) 24 hours prior to the COVID-19 vaccination.   There is no direct evidence about the efficacy of the COVID-19 vaccine in individuals who are on medications that suppress the immune  system.   Even if you are fully vaccinated, and you are on any medications that suppress your immune system, please continue to wear a mask, maintain at least six feet social distance and practice hand hygiene.   If you develop a COVID-19 infection, please contact your PCP or our office to determine if you need antibody infusion.  The booster vaccine is now available for immunocompromised patients. It is advised that if you had Pfizer vaccine you should get Pfizer booster.  If you had a Moderna vaccine then you should get a Moderna booster. Johnson and Johnson does not have a booster vaccine at this time.  Please see the following web sites for updated information.   [email or web address]-Patient-Resources.[email or web address]    

## 2020-09-25 NOTE — Progress Notes (Signed)
Dr. Estanislado Pandy discussed lab work with the patient today in the office.  ANA is pending.

## 2020-09-28 LAB — COMPLETE METABOLIC PANEL WITH GFR
AG Ratio: 1.6 (calc) (ref 1.0–2.5)
ALT: 28 U/L (ref 6–29)
AST: 26 U/L (ref 10–35)
Albumin: 4 g/dL (ref 3.6–5.1)
Alkaline phosphatase (APISO): 66 U/L (ref 37–153)
BUN: 13 mg/dL (ref 7–25)
CO2: 33 mmol/L — ABNORMAL HIGH (ref 20–32)
Calcium: 9.3 mg/dL (ref 8.6–10.4)
Chloride: 103 mmol/L (ref 98–110)
Creat: 0.73 mg/dL (ref 0.50–0.99)
GFR, Est African American: 99 mL/min/{1.73_m2} (ref >=60)
GFR, Est Non African American: 86 mL/min/{1.73_m2} (ref >=60)
Globulin: 2.5 g/dL (calc) (ref 1.9–3.7)
Glucose, Bld: 102 mg/dL — ABNORMAL HIGH (ref 65–99)
Potassium: 4.4 mmol/L (ref 3.5–5.3)
Sodium: 141 mmol/L (ref 135–146)
Total Bilirubin: 0.4 mg/dL (ref 0.2–1.2)
Total Protein: 6.5 g/dL (ref 6.1–8.1)

## 2020-09-28 LAB — CBC WITH DIFFERENTIAL/PLATELET
Absolute Monocytes: 229 cells/uL (ref 200–950)
Basophils Absolute: 19 cells/uL (ref 0–200)
Basophils Relative: 0.5 %
Eosinophils Absolute: 30 cells/uL (ref 15–500)
Eosinophils Relative: 0.8 %
HCT: 39.1 % (ref 35.0–45.0)
Hemoglobin: 13.2 g/dL (ref 11.7–15.5)
Lymphs Abs: 884 cells/uL (ref 850–3900)
MCH: 30.8 pg (ref 27.0–33.0)
MCHC: 33.8 g/dL (ref 32.0–36.0)
MCV: 91.1 fL (ref 80.0–100.0)
MPV: 10.4 fL (ref 7.5–12.5)
Monocytes Relative: 6.2 %
Neutro Abs: 2538 cells/uL (ref 1500–7800)
Neutrophils Relative %: 68.6 %
Platelets: 277 10*3/uL (ref 140–400)
RBC: 4.29 10*6/uL (ref 3.80–5.10)
RDW: 13.6 % (ref 11.0–15.0)
Total Lymphocyte: 23.9 %
WBC: 3.7 10*3/uL — ABNORMAL LOW (ref 3.8–10.8)

## 2020-09-28 LAB — C3 AND C4
C3 Complement: 171 mg/dL (ref 83–193)
C4 Complement: 35 mg/dL (ref 15–57)

## 2020-09-28 LAB — URINALYSIS, ROUTINE W REFLEX MICROSCOPIC
Bacteria, UA: NONE SEEN /HPF
Bilirubin Urine: NEGATIVE
Glucose, UA: NEGATIVE
Hgb urine dipstick: NEGATIVE
Hyaline Cast: NONE SEEN /LPF
Ketones, ur: NEGATIVE
Nitrite: NEGATIVE
Protein, ur: NEGATIVE
RBC / HPF: NONE SEEN /HPF (ref 0–2)
Specific Gravity, Urine: 1.011 (ref 1.001–1.03)
Squamous Epithelial / HPF: NONE SEEN /HPF (ref <=5)
WBC, UA: NONE SEEN /HPF (ref 0–5)
pH: >=8.5 — AB (ref 5.0–8.0)

## 2020-09-28 LAB — ANTI-NUCLEAR AB-TITER (ANA TITER): ANA Titer 1: 1:640 {titer} — ABNORMAL HIGH

## 2020-09-28 LAB — SJOGRENS SYNDROME-A EXTRACTABLE NUCLEAR ANTIBODY: SSA (Ro) (ENA) Antibody, IgG: >8 AI — AB

## 2020-09-28 LAB — ANA: Anti Nuclear Antibody (ANA): POSITIVE — AB

## 2020-09-28 LAB — ANTI-DNA ANTIBODY, DOUBLE-STRANDED: ds DNA Ab: <1 IU/mL

## 2020-09-28 LAB — SJOGRENS SYNDROME-B EXTRACTABLE NUCLEAR ANTIBODY: SSB (La) (ENA) Antibody, IgG: <1 AI

## 2020-09-28 LAB — SEDIMENTATION RATE: Sed Rate: 14 mm/h (ref 0–30)

## 2021-02-12 NOTE — Progress Notes (Signed)
Office Visit Note  Patient: Deborah Levy             Date of Birth: Oct 09, 1956           MRN: [id number]             PCP: Serita Grit, PA-C Referring: Serita Grit, PA* Visit Date: 02/26/2021 Occupation: @GUAROCC @  Subjective:  Medication management.   History of Present Illness: Deborah Levy is a 65 y.o. female history of systemic lupus, osteoarthritis and fibromyalgia syndrome.  She states she continues to have some fatigue and discomfort from fibromyalgia.  She denies any flare of cutaneous lupus.  There is no history of malar rash, Raynaud's phenomenon, lymphadenopathy, oral ulcers, nasal ulcers.  She has some sicca symptoms.  She continues to have some discomfort in her left knee joint off and on.  She denies any joint swelling.  Activities of Daily Living:  Patient reports morning stiffness for 2-3 hours.   Patient Denies nocturnal pain.  Difficulty dressing/grooming: Denies Difficulty climbing stairs: Denies Difficulty getting out of chair: Denies Difficulty using hands for taps, buttons, cutlery, and/or writing: Reports  Review of Systems  Constitutional: Positive for fatigue.  HENT: Positive for mouth dryness. Negative for mouth sores and nose dryness.   Eyes: Positive for dryness. Negative for pain and itching.  Respiratory: Negative for shortness of breath and difficulty breathing.   Cardiovascular: Negative for chest pain and palpitations.  Gastrointestinal: Negative for blood in stool, constipation and diarrhea.  Endocrine: Negative for increased urination.  Genitourinary: Negative for difficulty urinating.  Musculoskeletal: Positive for arthralgias, joint pain, joint swelling, myalgias, morning stiffness and myalgias. Negative for muscle tenderness.  Skin: Positive for rash. Negative for color change and redness.  Allergic/Immunologic: Negative for susceptible to infections.  Neurological: Positive for headaches, memory loss and weakness.  Negative for dizziness and numbness.  Hematological: Positive for bruising/bleeding tendency.  Psychiatric/Behavioral: Positive for confusion.    PMFS History:  Patient Active Problem List   Diagnosis Date Noted  . 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 History of gastroesophageal reflux (GERD) 07/01/2017  . Other fatigue 03/23/2017  . Primary insomnia 03/23/2017  . History of vitamin D deficiency 03/23/2017  . High risk medication use 10/18/2016  . 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 Type 2 diabetes mellitus ([place]) 11/22/2014  . Chronic pain 11/13/2014  . Fibromyalgia 11/13/2014  . Rotator cuff impingement syndrome 11/13/2014  . Cannot sleep 02/13/2014  . Essential (primary) hypertension 09/07/2013  . HLD (hyperlipidemia) 09/07/2013  . Adiposity 09/07/2013  . Systemic lupus erythematosus ([place]) 05/21/2013  . Hypokalemia 04/21/2013  . Breast cancer of lower-inner quadrant of left female breast ([place]) 01/12/2012    Past Medical History:  Diagnosis Date  . Breast cancer ([place])   . Cancer ([place])   . Fibromyalgia   . GERD (gastroesophageal reflux disease)   . Hypertension   . Lupus ([place])     Family History  Problem Relation Age of Onset  . Cancer Father        colon  . Hypertension Father   . Heart disease Father   . Cancer Sister        Cervical  . Cancer Brother        prostate   . Heart disease Maternal Grandfather   . Hypertension Paternal Grandmother   . Heart disease Paternal Grandmother   . Breast cancer Neg Hx    Past Surgical History:  Procedure  Laterality Date  . BREAST LUMPECTOMY Left 2012  . BREAST SURGERY  03/10/2011   Lt br lumpectomy  . COLONOSCOPY     Removed 2 polpys   . KNEE SURGERY    . SHOULDER SURGERY    . TUBAL LIGATION     Social History   Social History Narrative  . Not on file   Immunization History  Administered Date(s) Administered  . PFIZER(Purple Top)SARS-COV-2 Vaccination 03/04/2020, 03/25/2020      Objective: Vital Signs: BP 126/77 (BP Location: Left Arm, Patient Position: Sitting, Cuff Size: Normal)   Pulse 76   Resp 14   Ht 5' (1.524 m)   Wt 168 lb 6.4 oz (76.4 kg)   BMI 32.89 kg/m    Physical Exam Vitals and nursing note reviewed.  Constitutional:      Appearance: She is well-developed.  HENT:     Head: Normocephalic and atraumatic.  Eyes:     Conjunctiva/sclera: Conjunctivae normal.  Cardiovascular:     Rate and Rhythm: Normal rate and regular rhythm.     Heart sounds: Normal heart sounds.  Pulmonary:     Effort: Pulmonary effort is normal.     Breath sounds: Normal breath sounds.  Abdominal:     General: Bowel sounds are normal.     Palpations: Abdomen is soft.  Musculoskeletal:     Cervical back: Normal range of motion.  Lymphadenopathy:     Cervical: No cervical adenopathy.  Skin:    General: Skin is warm and dry.     Capillary Refill: Capillary refill takes less than 2 seconds.  Neurological:     Mental Status: She is alert and oriented to person, place, and time.  Psychiatric:        Behavior: Behavior normal.      Musculoskeletal Exam: C-spine was in good range of motion.  She has no thoracic kyphosis.  Lumbar spine was in good range of motion.  Shoulder joints, elbow joints, wrist joints, MCPs PIPs and DIPs with good range of motion with no synovitis.  Hip joints, knee joints, ankles, MTPs and PIPs with good range of motion with no synovitis.  CDAI Exam: CDAI Score: - Patient Global: -; Provider Global: - Swollen: -; Tender: - Joint Exam 02/26/2021   No joint exam has been documented for this visit   There is currently no information documented on the homunculus. Go to the Rheumatology activity and complete the homunculus joint exam.  Investigation: No additional findings.  Imaging: No results found.  Recent Labs: Lab Results  Component Value Date   WBC 3.7 (L) 09/24/2020   HGB 13.2 09/24/2020   PLT 277 09/24/2020   NA 141  09/24/2020   K 4.4 09/24/2020   CL 103 09/24/2020   CO2 33 (H) 09/24/2020   GLUCOSE 102 (H) 09/24/2020   BUN 13 09/24/2020   CREATININE 0.73 09/24/2020   BILITOT 0.4 09/24/2020   ALKPHOS 86 07/01/2017   AST 26 09/24/2020   ALT 28 09/24/2020   PROT 6.5 09/24/2020   ALBUMIN 4.3 07/01/2017   CALCIUM 9.3 09/24/2020   GFRAA 99 09/24/2020    Speciality Comments: No specialty comments available.  Procedures:  No procedures performed Allergies: Sulfa antibiotics, Escitalopram, Metformin, and Sulfasalazine   Assessment / Plan:     Visit Diagnoses: Other systemic lupus erythematosus with other organ involvement (HCC) - Fatigue, arthralgia, malar rash, photosensitivity, positive subcutaneous lupus on biopsy, Positive ANA, Ro+ and La +:  -She is clinically doing well with her  no recent episode of rash, malar rash, synovitis, photosensitivity.  She continues to have some joint discomfort and fatigue which may be related to fibromyalgia.  I will obtain following labs today.  Plan: Urinalysis, Routine w reflex microscopic, Anti-DNA antibody, double-stranded, C3 and C4, Sedimentation rate  High risk medication use - MTX 0.3 ml sq once weekly and folic acid 1 mg po daily. Reduced dose of MTX due to elevated LFTs.  - Plan: CBC with Differential/Platelet, COMPLETE METABOLIC PANEL WITH GFR today and then every 3 months to monitor for drug toxicity.  Cutaneous lupus erythematosus-she denies any episodes of rash.  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  Strengthening exercises were discussed.  Fibromyalgia-she continues to have some generalized pain and discomfort.  Primary insomnia-good sleep hygiene was discussed.  Other fatigue-related to fibromyalgia and insomnia.  History of vitamin D deficiency-she is currently not taking vitamin D.  History of hyperlipidemia-increased risk of heart disease with autoimmune  disease was discussed.  Need for regular exercise, weight loss and dietary management was discussed.  Handout was placed in the AVS.  History of hypertension-her blood pressure is well controlled.  History of diabetes mellitus  History of depression  History of breast cancer  History of gastroesophageal reflux (GERD)  Osteoporosis screening-I will schedule DEXA scan.  Use of calcium, vitamin D and resistive exercises were discussed.  Postmenopausal  Orders: Orders Placed This Encounter  Procedures  . DG BONE DENSITY (DXA)  . CBC with Differential/Platelet  . Urinalysis, Routine w reflex microscopic  . COMPLETE METABOLIC PANEL WITH GFR  . Anti-DNA antibody, double-stranded  . C3 and C4  . Sedimentation rate   No orders of the defined types were placed in this encounter.    Follow-Up Instructions: Return in about 5 months (around 07/29/2021) for Systemic lupus.   Bo Merino, MD  Note - This record has been created using Editor, commissioning.  Chart creation errors have been sought, but may not always  have been located. Such creation errors do not reflect on  the standard of medical care.

## 2021-02-26 ENCOUNTER — Other Ambulatory Visit: Payer: Self-pay

## 2021-02-26 ENCOUNTER — Ambulatory Visit (INDEPENDENT_AMBULATORY_CARE_PROVIDER_SITE_OTHER): Payer: BC Managed Care – PPO | Admitting: Rheumatology

## 2021-02-26 ENCOUNTER — Encounter: Payer: Self-pay | Admitting: Rheumatology

## 2021-02-26 VITALS — BP 126/77 | HR 76 | Resp 14 | Ht 60.0 in | Wt 168.4 lb

## 2021-02-26 DIAGNOSIS — Z8679 Personal history of other diseases of the circulatory system: Secondary | ICD-10-CM

## 2021-02-26 DIAGNOSIS — L932 Other local lupus erythematosus: Secondary | ICD-10-CM | POA: Diagnosis not present

## 2021-02-26 DIAGNOSIS — M3219 Other organ or system involvement in systemic lupus erythematosus: Secondary | ICD-10-CM | POA: Diagnosis not present

## 2021-02-26 DIAGNOSIS — R5383 Other fatigue: Secondary | ICD-10-CM

## 2021-02-26 DIAGNOSIS — H35389 Toxic maculopathy, unspecified eye: Secondary | ICD-10-CM

## 2021-02-26 DIAGNOSIS — Z79899 Other long term (current) drug therapy: Secondary | ICD-10-CM | POA: Diagnosis not present

## 2021-02-26 DIAGNOSIS — Z13820 Encounter for screening for osteoporosis: Secondary | ICD-10-CM

## 2021-02-26 DIAGNOSIS — Z780 Asymptomatic menopausal state: Secondary | ICD-10-CM

## 2021-02-26 DIAGNOSIS — M797 Fibromyalgia: Secondary | ICD-10-CM

## 2021-02-26 DIAGNOSIS — Z8719 Personal history of other diseases of the digestive system: Secondary | ICD-10-CM

## 2021-02-26 DIAGNOSIS — Z8639 Personal history of other endocrine, nutritional and metabolic disease: Secondary | ICD-10-CM

## 2021-02-26 DIAGNOSIS — Z8659 Personal history of other mental and behavioral disorders: Secondary | ICD-10-CM

## 2021-02-26 DIAGNOSIS — M1712 Unilateral primary osteoarthritis, left knee: Secondary | ICD-10-CM

## 2021-02-26 DIAGNOSIS — F5101 Primary insomnia: Secondary | ICD-10-CM

## 2021-02-26 DIAGNOSIS — T372X5A Adverse effect of antimalarials and drugs acting on other blood protozoa, initial encounter: Secondary | ICD-10-CM

## 2021-02-26 DIAGNOSIS — Z853 Personal history of malignant neoplasm of breast: Secondary | ICD-10-CM

## 2021-02-26 NOTE — Patient Instructions (Signed)
Standing Labs We placed an order today for your standing lab work.   Please have your standing labs drawn in June and every 3 months  If possible, please have your labs drawn 2 weeks prior to your appointment so that the provider can discuss your results at your appointment.  We have open lab daily Monday through Thursday from 1:30-4:30 PM and Friday from 1:30-4:00 PM at the office of Dr. Bo Merino, [facility].   Please be advised, all patients with office appointments requiring lab work will take precedents over walk-in lab work.  If possible, please come for your lab work on Monday and Friday afternoons, as you may experience shorter wait times. The office is located at [address], [place], [place], [place] [address] No appointment is necessary.   Labs are drawn by [facility]. Please bring your co-pay at the time of your lab draw.  You may receive a bill from [facility] for your lab work.  If you wish to have your labs drawn at another location, please call the office 24 hours in advance to send orders.  If you have any questions regarding directions or hours of operation,  please call [phone].   As a reminder, please drink plenty of water prior to coming for your lab work. Thanks!   COVID-19 vaccine recommendations:   COVID-19 vaccine is recommended for everyone (unless you are allergic to a vaccine component), even if you are on a medication that suppresses your immune system.   If you are on Methotrexate, Cellcept (mycophenolate), Rinvoq, Morrie Sheldon, and Olumiant- hold the medication for 1 week after each vaccine. Hold Methotrexate for 2 weeks after the single dose COVID-19 vaccine.   Recommendations are that patients on immunosuppressive medications should get first 3 COVID-19 vaccination 1 month apart and then the fourth dose (booster) 6 months after the third dose.   Do not take Tylenol or any anti-inflammatory medications (NSAIDs) 24 hours prior to the  COVID-19 vaccination.   There is no direct evidence about the efficacy of the COVID-19 vaccine in individuals who are on medications that suppress the immune system.   Even if you are fully vaccinated, and you are on any medications that suppress your immune system, please continue to wear a mask, maintain at least six feet social distance and practice hand hygiene.   If you develop a COVID-19 infection, please contact your PCP or our office to determine if you need monoclonal antibody infusion.  The booster vaccine is now available for immunocompromised patients.   Please see the following web sites for updated information.   [email or web address]-Patient-Resources.pdf  Heart Disease Prevention   Your inflammatory disease increases your risk of heart disease which includes heart attack, stroke, atrial fibrillation (irregular heartbeats), high blood pressure, heart failure and atherosclerosis (plaque in the arteries).  It is important to reduce your risk by:   . Keep blood pressure, cholesterol, and blood sugar at healthy levels   . Smoking Cessation   . Maintain a healthy weight  o BMI 20-25   . Eat a healthy diet  o Plenty of fresh fruit, vegetables, and whole grains  o Limit saturated fats, foods high in sodium, and added sugars  o DASH and Mediterranean diet   . Increase physical activity  o Recommend moderate physically activity for 150 minutes per week/ 30 minutes a day for five days a week These can be broken up into three separate ten-minute sessions during the day.   . Reduce Stress  .  Meditation, slow breathing exercises, yoga, coloring books  . Dental visits twice a year

## 2021-02-27 LAB — URINALYSIS, ROUTINE W REFLEX MICROSCOPIC
Bacteria, UA: NONE SEEN /HPF
Bilirubin Urine: NEGATIVE
Glucose, UA: NEGATIVE
Hgb urine dipstick: NEGATIVE
Hyaline Cast: NONE SEEN /LPF
Ketones, ur: NEGATIVE
Nitrite: NEGATIVE
Protein, ur: NEGATIVE
RBC / HPF: NONE SEEN /HPF (ref 0–2)
Specific Gravity, Urine: 1.006 (ref 1.001–1.03)
pH: >=8.5 — AB (ref 5.0–8.0)

## 2021-02-27 LAB — CBC WITH DIFFERENTIAL/PLATELET
Absolute Monocytes: 353 cells/uL (ref 200–950)
Basophils Absolute: 19 cells/uL (ref 0–200)
Basophils Relative: 0.3 %
Eosinophils Absolute: 63 cells/uL (ref 15–500)
Eosinophils Relative: 1 %
HCT: 38.6 % (ref 35.0–45.0)
Hemoglobin: 13.4 g/dL (ref 11.7–15.5)
Lymphs Abs: 1380 cells/uL (ref 850–3900)
MCH: 30.8 pg (ref 27.0–33.0)
MCHC: 34.7 g/dL (ref 32.0–36.0)
MCV: 88.7 fL (ref 80.0–100.0)
MPV: 10.5 fL (ref 7.5–12.5)
Monocytes Relative: 5.6 %
Neutro Abs: 4486 cells/uL (ref 1500–7800)
Neutrophils Relative %: 71.2 %
Platelets: 239 10*3/uL (ref 140–400)
RBC: 4.35 10*6/uL (ref 3.80–5.10)
RDW: 13.1 % (ref 11.0–15.0)
Total Lymphocyte: 21.9 %
WBC: 6.3 10*3/uL (ref 3.8–10.8)

## 2021-02-27 LAB — C3 AND C4
C3 Complement: 142 mg/dL (ref 83–193)
C4 Complement: 28 mg/dL (ref 15–57)

## 2021-02-27 LAB — COMPLETE METABOLIC PANEL WITH GFR
AG Ratio: 1.6 (calc) (ref 1.0–2.5)
ALT: 33 U/L — ABNORMAL HIGH (ref 6–29)
AST: 30 U/L (ref 10–35)
Albumin: 4.2 g/dL (ref 3.6–5.1)
Alkaline phosphatase (APISO): 71 U/L (ref 37–153)
BUN: 14 mg/dL (ref 7–25)
CO2: 34 mmol/L — ABNORMAL HIGH (ref 20–32)
Calcium: 9.8 mg/dL (ref 8.6–10.4)
Chloride: 102 mmol/L (ref 98–110)
Creat: 0.73 mg/dL (ref 0.50–0.99)
GFR, Est African American: 99 mL/min/{1.73_m2} (ref >=60)
GFR, Est Non African American: 86 mL/min/{1.73_m2} (ref >=60)
Globulin: 2.7 g/dL (calc) (ref 1.9–3.7)
Glucose, Bld: 99 mg/dL (ref 65–99)
Potassium: 4.5 mmol/L (ref 3.5–5.3)
Sodium: 141 mmol/L (ref 135–146)
Total Bilirubin: 0.5 mg/dL (ref 0.2–1.2)
Total Protein: 6.9 g/dL (ref 6.1–8.1)

## 2021-02-27 LAB — ANTI-DNA ANTIBODY, DOUBLE-STRANDED: ds DNA Ab: <1 IU/mL

## 2021-02-27 LAB — SEDIMENTATION RATE: Sed Rate: 11 mm/h (ref 0–30)

## 2021-02-27 NOTE — Progress Notes (Signed)
CBC is normal.  UA is negative.  ALT is mildly elevated most likely due to methotrexate and statin use.  All other labs are normal.  No change in treatment advised.

## 2021-03-28 ENCOUNTER — Telehealth: Payer: Self-pay

## 2021-03-28 NOTE — Telephone Encounter (Signed)
Noted  

## 2021-03-28 NOTE — Telephone Encounter (Signed)
Patient called to let Dr. Estanislado Pandy know that Dr. Malachi Pro placed an order for her bone density scan.

## 2021-04-26 ENCOUNTER — Telehealth: Payer: Self-pay

## 2021-04-26 NOTE — Telephone Encounter (Signed)
LMOM for patient to call to advise what the patient would like for the letter to say.

## 2021-04-26 NOTE — Telephone Encounter (Signed)
Patient requested a return call to see if Dr. Estanislado Pandy would be willing to write a letter stating what her disabilities are regarding her lupus and fibromyalgia for her employer.   I explained to patient that Dr. Estanislado Pandy was out of the office and would return on Monday, 5/16.

## 2021-04-29 ENCOUNTER — Encounter: Payer: Self-pay | Admitting: *Deleted

## 2021-04-29 MED ORDER — METHOTREXATE SODIUM CHEMO INJECTION 50 MG/2ML: 7.5000 mg | INTRAMUSCULAR | 0 refills | Status: DC

## 2021-04-29 NOTE — Telephone Encounter (Signed)
We can send a letter stating that due to patient's medical condition and increased fatigue please allow her to start work at 10 AM.

## 2021-04-29 NOTE — Telephone Encounter (Signed)
Patient requesting a letter stating that she has lupus and fibromyalgia and needs to start work at 10:00 am due to her symptoms. Patient is on MTX. Patient receives opioids from another provider that causes her drowiness where she cannot get to work on time at 8:00 am. I advised patient to reach out to the provider that prescribes her opioids for a letter as well. Please advise.

## 2021-04-29 NOTE — Telephone Encounter (Signed)
LMOM, letter at front desk for patient to pick up.

## 2021-04-29 NOTE — Telephone Encounter (Signed)
Next Visit: 08/13/2021  Last Visit: 02/26/2021  Last Fill: 06/22/2020   DX: Other systemic lupus erythematosus with other organ involvement   Current Dose per office note 02/26/2021, MTX 0.3 ml sq once weekly  Labs: 02/26/2021, CBC is normal. UA is negative. ALT is mildly elevated most likely due to methotrexate and statin use. All other labs are normal. No change in treatment advised.  Okay to refill MTX ?

## 2021-05-26 ENCOUNTER — Other Ambulatory Visit: Payer: Self-pay | Admitting: Rheumatology

## 2021-05-29 ENCOUNTER — Other Ambulatory Visit: Payer: Self-pay | Admitting: Rheumatology

## 2021-05-29 NOTE — Telephone Encounter (Signed)
Next Visit: 08/13/2021  Last Visit: 02/26/2021  Last Fill: 09/04/2021  Dx:  Other systemic lupus erythematosus with other organ involvement  Current Dose per office note on 02/26/2021: not mentioned  Okay to refill zofran?

## 2021-06-07 ENCOUNTER — Other Ambulatory Visit: Payer: Self-pay | Admitting: Obstetrics and Gynecology

## 2021-06-07 DIAGNOSIS — Z1231 Encounter for screening mammogram for malignant neoplasm of breast: Secondary | ICD-10-CM

## 2021-07-31 ENCOUNTER — Ambulatory Visit: Payer: BC Managed Care – PPO

## 2021-08-06 ENCOUNTER — Ambulatory Visit
Admission: RE | Admit: 2021-08-06 | Discharge: 2021-08-06 | Disposition: A | Discharge status: Home / Self Care | Payer: BC Managed Care – PPO | Source: Ambulatory Visit | Attending: Obstetrics and Gynecology | Admitting: Obstetrics and Gynecology

## 2021-08-06 ENCOUNTER — Other Ambulatory Visit: Payer: Self-pay

## 2021-08-06 DIAGNOSIS — Z1231 Encounter for screening mammogram for malignant neoplasm of breast: Secondary | ICD-10-CM

## 2021-08-13 ENCOUNTER — Ambulatory Visit: Payer: BC Managed Care – PPO | Admitting: Rheumatology

## 2021-08-20 NOTE — Progress Notes (Signed)
Office Visit Note  Patient: Deborah Levy             Date of Birth: Dec 21, 1955           MRN: [id number]             PCP: Drosinis, Pamalee Leyden, PA-C Referring: Serita Grit, PA* Visit Date: 09/02/2021 Occupation: '@GUAROCC'$ @  Subjective:  Joint pain and stiffness.   History of Present Illness: Deborah Levy is a 65 y.o. female with a history of systemic lupus, osteoarthritis and fibromyalgia syndrome.  She states she continues to have morning stiffness.  She also complains of pain and discomfort in her bilateral hips and her bilateral knee joints.  She notices some swelling in her hands and her knee joints.  She continues to have generalized pain and discomfort from fibromyalgia syndrome.  She has dry mouth and dry eyes for which she has been using over-the-counter products.  She continues to have some fatigue as well.  She has lower back pain when she is bending over and doing dishes.  She denies any radiculopathy.  Activities of Daily Living:  Patient reports morning stiffness for several hours.   Patient Denies nocturnal pain.  Difficulty dressing/grooming: Denies Difficulty climbing stairs: Reports Difficulty getting out of chair: Denies Difficulty using hands for taps, buttons, cutlery, and/or writing: Reports  Review of Systems  Constitutional:  Positive for fatigue.  HENT:  Positive for mouth dryness and nose dryness. Negative for mouth sores.   Eyes:  Positive for dryness. Negative for pain and itching.  Respiratory:  Negative for shortness of breath and difficulty breathing.   Cardiovascular:  Negative for chest pain and palpitations.  Gastrointestinal:  Positive for constipation. Negative for blood in stool and diarrhea.  Endocrine: Negative for increased urination.  Genitourinary:  Negative for difficulty urinating.  Musculoskeletal:  Positive for joint pain, joint pain, joint swelling, myalgias, morning stiffness, muscle tenderness and myalgias.  Skin:   Negative for color change, rash, redness and sensitivity to sunlight.  Allergic/Immunologic: Negative for susceptible to infections.  Neurological:  Positive for headaches. Negative for dizziness, numbness, memory loss and weakness.  Hematological:  Positive for bruising/bleeding tendency. Negative for swollen glands.  Psychiatric/Behavioral:  Positive for depressed mood and sleep disturbance. Negative for confusion. The patient is nervous/anxious.    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   Type 2 diabetes mellitus ([place]) 11/22/2014   Chronic pain 11/13/2014   Fibromyalgia 11/13/2014   Rotator cuff impingement syndrome 11/13/2014   Cannot sleep 02/13/2014   Essential (primary) hypertension 09/07/2013   HLD (hyperlipidemia) 09/07/2013   Adiposity 09/07/2013   Systemic lupus erythematosus ([place]) 05/21/2013   Hypokalemia 04/21/2013   Breast cancer of lower-inner quadrant of left female breast ([place]) 01/12/2012    Past Medical History:  Diagnosis Date   Breast cancer ([place])    Cancer ([place])    Fibromyalgia    GERD (gastroesophageal reflux disease)    Hypertension    Lupus ([place])     Family History  Problem Relation Age of Onset   Cancer Father        colon   Hypertension Father  Heart disease Father    Cancer Sister        Cervical   Cancer Brother        prostate    Heart disease Maternal Grandfather    Hypertension Paternal Grandmother    Heart disease Paternal Grandmother    Breast cancer Neg Hx    Past Surgical History:  Procedure Laterality Date   BREAST LUMPECTOMY Left 2012   BREAST SURGERY  03/10/2011   Lt br lumpectomy   COLONOSCOPY      Removed 2 polpys    KNEE SURGERY     SHOULDER SURGERY     TUBAL LIGATION     Social History   Social History Narrative   Not on file   Immunization History  Administered Date(s) Administered   PFIZER(Purple Top)SARS-COV-2 Vaccination 03/04/2020, 03/25/2020     Objective: Vital Signs: BP 123/79 (BP Location: Left Arm, Patient Position: Sitting, Cuff Size: Normal)   Pulse 69   Ht 5' (1.524 m)   Wt 168 lb (76.2 kg)   BMI 32.81 kg/m    Physical Exam Vitals and nursing note reviewed.  Constitutional:      Appearance: She is well-developed.  HENT:     Head: Normocephalic and atraumatic.  Eyes:     Conjunctiva/sclera: Conjunctivae normal.  Cardiovascular:     Rate and Rhythm: Normal rate and regular rhythm.     Heart sounds: Normal heart sounds.  Pulmonary:     Effort: Pulmonary effort is normal.     Breath sounds: Normal breath sounds.  Abdominal:     General: Bowel sounds are normal.     Palpations: Abdomen is soft.  Musculoskeletal:     Cervical back: Normal range of motion.  Lymphadenopathy:     Cervical: No cervical adenopathy.  Skin:    General: Skin is warm and dry.     Capillary Refill: Capillary refill takes less than 2 seconds.  Neurological:     Mental Status: She is alert and oriented to person, place, and time.  Psychiatric:        Behavior: Behavior normal.     Musculoskeletal Exam: Cervical spine was in good range of motion.  Shoulder joints, elbow joints, wrist joints, MCPs PIPs and DIPs with good range of motion.  She had bilateral DIP thickening with no synovitis.  Hip joints with good range of motion.  She had tenderness over bilateral trochanteric bursa.  She had discomfort range of motion knee joints without any warmth swelling or effusion.  There was no tenderness over ankles or MTPs.  She has some generalized hyperalgesia and positive tender points.    CDAI Exam: CDAI Score: -- Patient Global: --; Provider Global: -- Swollen: --; Tender:  -- Joint Exam 09/02/2021   No joint exam has been documented for this visit   There is currently no information documented on the homunculus. Go to the Rheumatology activity and complete the homunculus joint exam.  Investigation: No additional findings.  Imaging: MM 3D SCREEN BREAST BILATERAL  Result Date: 08/08/2021 CLINICAL DATA:  Screening. EXAM: DIGITAL SCREENING BILATERAL MAMMOGRAM WITH TOMOSYNTHESIS AND CAD TECHNIQUE: Bilateral screening digital craniocaudal and mediolateral oblique mammograms were obtained. Bilateral screening digital breast tomosynthesis was performed. The images were evaluated with computer-aided detection. COMPARISON:  Previous exam(s). ACR Breast Density Category b: There are scattered areas of fibroglandular density. FINDINGS: There are no findings suspicious for malignancy. IMPRESSION: No mammographic evidence of malignancy. A result letter of this screening mammogram will be mailed directly to  the patient. RECOMMENDATION: Screening mammogram in one year. (Code:SM-B-01Y) BI-RADS CATEGORY  1: Negative. Electronically Signed   By: Franki Cabot M.D.   On: 08/08/2021 09:40    Recent Labs: Lab Results  Component Value Date   WBC 6.3 02/26/2021   HGB 13.4 02/26/2021   PLT 239 02/26/2021   NA 141 02/26/2021   K 4.5 02/26/2021   CL 102 02/26/2021   CO2 34 (H) 02/26/2021   GLUCOSE 99 02/26/2021   BUN 14 02/26/2021   CREATININE 0.73 02/26/2021   BILITOT 0.5 02/26/2021   ALKPHOS 86 07/01/2017   AST 30 02/26/2021   ALT 33 (H) 02/26/2021   PROT 6.9 02/26/2021   ALBUMIN 4.3 07/01/2017   CALCIUM 9.8 02/26/2021   GFRAA 99 02/26/2021    Speciality Comments: No specialty comments available.  Procedures:  No procedures performed Allergies: Sulfa antibiotics, Escitalopram, Metformin, and Sulfasalazine   Assessment / Plan:     Visit Diagnoses: Other systemic lupus erythematosus with other organ involvement (HCC) - Fatigue, arthralgia, malar rash,  photosensitivity, positive subcutaneous lupus on biopsy, Positive ANA, Ro+ and La +:  -She continues to have some fatigue and sicca symptoms.  She has been using sunscreen.  There is no recent episodes of malar rash, subcutaneous lupus rash.  I will obtain autoimmune labs today.  Plan: Urinalysis, Routine w reflex microscopic, Anti-DNA antibody, double-stranded, C3 and C4, Sedimentation rate  High risk medication use - MTX 0.3 ml sq once weekly and folic acid 1 mg po daily. Reduced dose of MTX due to elevated LFTs. - Plan: CBC with Differential/Platelet, COMPLETE METABOLIC PANEL WITH GFR today and then every 3 months to monitor for drug toxicity.  Patient is planning to get COVID-19 vaccination.  I advised her to delay the methotrexate for 1 week after the procedure.  Instructions placed in the AVS.  Instructions regarding other vaccines were also placed in the AVS.  She has been advised to stop methotrexate in case she develops an infection and resume after infection resolves.  Cutaneous lupus erythematosus-she has been using sunscreen.  She denies having any recent rash.  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  A handout on IT band stretches was given.  Primary osteoarthritis of left knee-she continues to have some discomfort in her knee joints.  A handout on knee joint exercises was given.  Fibromyalgia-she has generalized pain from fibromyalgia and stiffness.  She has been doing water aerobics which will be helpful.  Other fatigue-related to fibromyalgia and insomnia.  Primary insomnia-good sleep hygiene was discussed.    History of vitamin D deficiency-her vitamin D was normal in 2019.  History of hypertension-blood pressure was normal.  History of hyperlipidemia-increased risk of heart disease with autoimmune disease was discussed.  Dietary modifications and exercise was  emphasized.  History of diabetes mellitus  History of breast cancer  History of depression-she recently retired and does not right retirement.  History of gastroesophageal reflux (GERD)  Osteoporosis screening - DEXA ordered at the last visit.  She was advised to schedule DEXA scan.  Orders: Orders Placed This Encounter  Procedures   CBC with Differential/Platelet   COMPLETE METABOLIC PANEL WITH GFR   Urinalysis, Routine w reflex microscopic   Anti-DNA antibody, double-stranded   C3 and C4   Sedimentation rate   No orders of the defined types were placed in this encounter.    Follow-Up Instructions: Return in about 5 months (around  02/02/2022) for Systemic lupus.   Bo Merino, MD  Note - This record has been created using Editor, commissioning.  Chart creation errors have been sought, but may not always  have been located. Such creation errors do not reflect on  the standard of medical care.

## 2021-09-02 ENCOUNTER — Ambulatory Visit: Payer: BC Managed Care – PPO | Admitting: Rheumatology

## 2021-09-02 ENCOUNTER — Other Ambulatory Visit: Payer: Self-pay

## 2021-09-02 ENCOUNTER — Encounter: Payer: Self-pay | Admitting: Rheumatology

## 2021-09-02 VITALS — BP 123/79 | HR 69 | Ht 60.0 in | Wt 168.0 lb

## 2021-09-02 DIAGNOSIS — Z79899 Other long term (current) drug therapy: Secondary | ICD-10-CM

## 2021-09-02 DIAGNOSIS — Z8679 Personal history of other diseases of the circulatory system: Secondary | ICD-10-CM

## 2021-09-02 DIAGNOSIS — Z13820 Encounter for screening for osteoporosis: Secondary | ICD-10-CM

## 2021-09-02 DIAGNOSIS — Z853 Personal history of malignant neoplasm of breast: Secondary | ICD-10-CM

## 2021-09-02 DIAGNOSIS — F5101 Primary insomnia: Secondary | ICD-10-CM

## 2021-09-02 DIAGNOSIS — H35389 Toxic maculopathy, unspecified eye: Secondary | ICD-10-CM | POA: Diagnosis not present

## 2021-09-02 DIAGNOSIS — M3219 Other organ or system involvement in systemic lupus erythematosus: Secondary | ICD-10-CM | POA: Diagnosis not present

## 2021-09-02 DIAGNOSIS — L932 Other local lupus erythematosus: Secondary | ICD-10-CM

## 2021-09-02 DIAGNOSIS — M7061 Trochanteric bursitis, right hip: Secondary | ICD-10-CM

## 2021-09-02 DIAGNOSIS — R5383 Other fatigue: Secondary | ICD-10-CM

## 2021-09-02 DIAGNOSIS — M1712 Unilateral primary osteoarthritis, left knee: Secondary | ICD-10-CM

## 2021-09-02 DIAGNOSIS — Z8639 Personal history of other endocrine, nutritional and metabolic disease: Secondary | ICD-10-CM

## 2021-09-02 DIAGNOSIS — M797 Fibromyalgia: Secondary | ICD-10-CM

## 2021-09-02 DIAGNOSIS — Z8659 Personal history of other mental and behavioral disorders: Secondary | ICD-10-CM

## 2021-09-02 DIAGNOSIS — M7062 Trochanteric bursitis, left hip: Secondary | ICD-10-CM

## 2021-09-02 DIAGNOSIS — T372X5A Adverse effect of antimalarials and drugs acting on other blood protozoa, initial encounter: Secondary | ICD-10-CM

## 2021-09-02 DIAGNOSIS — Z8719 Personal history of other diseases of the digestive system: Secondary | ICD-10-CM

## 2021-09-02 NOTE — Patient Instructions (Addendum)
Please call to schedule a bone density scan: Phone: [phone]  Standing Labs We placed an order today for your standing lab work.   Please have your standing labs drawn in December and every 3 months  If possible, please have your labs drawn 2 weeks prior to your appointment so that the provider can discuss your results at your appointment.  Please note that you may see your imaging and lab results in [place] before we have reviewed them. We may be awaiting multiple results to interpret others before contacting you. Please allow our office up to 72 hours to thoroughly review all of the results before contacting the office for clarification of your results.  We have open lab daily: Monday through Thursday from 1:30-4:30 PM and Friday from 1:30-4:00 PM at the office of Dr. Bo Merino, [facility].   Please be advised, all patients with office appointments requiring lab work will take precedent over walk-in lab work.  If possible, please come for your lab work on Monday and Friday afternoons, as you may experience shorter wait times. The office is located at [address] [place], [place], [place] [address] No appointment is necessary.   Labs are drawn by [facility]. Please bring your co-pay at the time of your lab draw.  You may receive a bill from [place] for your lab work.  If you wish to have your labs drawn at another location, please call the office 24 hours in advance to send orders.  If you have any questions regarding directions or hours of operation,  please call [phone].   As a reminder, please drink plenty of water prior to coming for your lab work. Thanks!  COVID-19 vaccine recommendations:   COVID-19 vaccine is recommended for everyone (unless you are allergic to a vaccine component), even if you are on a medication that suppresses your immune system.   If you are on Methotrexate, Cellcept (mycophenolate), Rinvoq, Morrie Sheldon, and Olumiant- hold the  medication for 1 week after each vaccine. Hold Methotrexate for 2 weeks after the single dose COVID-19 vaccine.   If you are on Orencia subcutaneous injection - hold medication one week prior to and one week after the first COVID-19 vaccine dose (only).   If you are on Orencia IV infusions- time vaccination administration so that the first COVID-19 vaccination will occur four weeks after the infusion and postpone the subsequent infusion by one week.   If you are on Cyclophosphamide or Rituxan infusions please contact your doctor prior to receiving the COVID-19 vaccine.   Do not take Tylenol or any anti-inflammatory medications (NSAIDs) 24 hours prior to the COVID-19 vaccination.   There is no direct evidence about the efficacy of the COVID-19 vaccine in individuals who are on medications that suppress the immune system.   Even if you are fully vaccinated, and you are on any medications that suppress your immune system, please continue to wear a mask, maintain at least six feet social distance and practice hand hygiene.   If you develop a COVID-19 infection, please contact your PCP or our office to determine if you need monoclonal antibody infusion.  The booster vaccine is now available for immunocompromised patients.   Please see the following web sites for updated information.   [email or web address]   Vaccines You are taking a medication(s) that can suppress your immune system.  The following immunizations are recommended: Flu annually Covid-19  Td/Tdap (tetanus, diphtheria, pertussis) every 10 years Pneumonia (Prevnar 15 then Pneumovax 23 at least  1 year apart.  Alternatively, can take Prevnar 20 without needing additional dose) Shingrix: 2 doses from 4 weeks to 6 months apart  Please check with your PCP to make sure you are up to date.   If you have signs or symptoms of an infection or start antibiotics: First,  call your PCP for workup of your infection. Hold your medication through the infection, until you complete your antibiotics, and until symptoms resolve if you take the following: Injectable medication (Actemra, Benlysta, Cimzia, Cosentyx, Enbrel, Humira, Kevzara, Orencia, Remicade, Simponi, Stelara, Taltz, Tremfya) Methotrexate Leflunomide (Arava) Mycophenolate (Cellcept) Roma Kayser, or Rinvoq   Knee Exercises Ask your health care provider which exercises are safe for you. Do exercises exactly as told by your health care provider and adjust them as directed. It is normal to feel mild stretching, pulling, tightness, or discomfort as you do these exercises. Stop right away if you feel sudden pain or your pain gets worse. Do not begin these exercises until told by your health care provider. Stretching and range-of-motion exercises These exercises warm up your muscles and joints and improve the movement and flexibility of your knee. These exercises also help to relieve pain and swelling. Knee extension, prone Lie on your abdomen (prone position) on a bed. Place your left / right knee just beyond the edge of the surface so your knee is not on the bed. You can put a towel under your left / right thigh just above your kneecap for comfort. Relax your leg muscles and allow gravity to straighten your knee (extension). You should feel a stretch behind your left / right knee. Hold this position for __________ seconds. Scoot up so your knee is supported between repetitions. Repeat __________ times. Complete this exercise __________ times a day. Knee flexion, active  Lie on your back with both legs straight. If this causes back discomfort, bend your left / right knee so your foot is flat on the floor. Slowly slide your left / right heel back toward your buttocks. Stop when you feel a gentle stretch in the front of your knee or thigh (flexion). Hold this position for __________ seconds. Slowly slide  your left / right heel back to the starting position. Repeat __________ times. Complete this exercise __________ times a day. Quadriceps stretch, prone  Lie on your abdomen on a firm surface, such as a bed or padded floor. Bend your left / right knee and hold your ankle. If you cannot reach your ankle or pant leg, loop a belt around your foot and grab the belt instead. Gently pull your heel toward your buttocks. Your knee should not slide out to the side. You should feel a stretch in the front of your thigh and knee (quadriceps). Hold this position for __________ seconds. Repeat __________ times. Complete this exercise __________ times a day. Hamstring, supine Lie on your back (supine position). Loop a belt or towel over the ball of your left / right foot. The ball of your foot is on the walking surface, right under your toes. Straighten your left / right knee and slowly pull on the belt to raise your leg until you feel a gentle stretch behind your knee (hamstring). Do not let your knee bend while you do this. Keep your other leg flat on the floor. Hold this position for __________ seconds. Repeat __________ times. Complete this exercise __________ times a day. Strengthening exercises These exercises build strength and endurance in your knee. Endurance is the ability to use your  muscles for a long time, even after they get tired. Quadriceps, isometric This exercise stretches the muscles in front of your thigh (quadriceps) without moving your knee joint (isometric). Lie on your back with your left / right leg extended and your other knee bent. Put a rolled towel or small pillow under your knee if told by your health care provider. Slowly tense the muscles in the front of your left / right thigh. You should see your kneecap slide up toward your hip or see increased dimpling just above the knee. This motion will push the back of the knee toward the floor. For __________ seconds, hold the muscle  as tight as you can without increasing your pain. Relax the muscles slowly and completely. Repeat __________ times. Complete this exercise __________ times a day. Straight leg raises This exercise stretches the muscles in front of your thigh (quadriceps) and the muscles that move your hips (hip flexors). Lie on your back with your left / right leg extended and your other knee bent. Tense the muscles in the front of your left / right thigh. You should see your kneecap slide up or see increased dimpling just above the knee. Your thigh may even shake a bit. Keep these muscles tight as you raise your leg 4-6 inches (10-15 cm) off the floor. Do not let your knee bend. Hold this position for __________ seconds. Keep these muscles tense as you lower your leg. Relax your muscles slowly and completely after each repetition. Repeat __________ times. Complete this exercise __________ times a day. Hamstring, isometric Lie on your back on a firm surface. Bend your left / right knee about __________ degrees. Dig your left / right heel into the surface as if you are trying to pull it toward your buttocks. Tighten the muscles in the back of your thighs (hamstring) to "dig" as hard as you can without increasing any pain. Hold this position for __________ seconds. Release the tension gradually and allow your muscles to relax completely for __________ seconds after each repetition. Repeat __________ times. Complete this exercise __________ times a day. Hamstring curls If told by your health care provider, do this exercise while wearing ankle weights. Begin with __________ lb weights. Then increase the weight by 1 lb (0.5 kg) increments. Do not wear ankle weights that are more than __________ lb. Lie on your abdomen with your legs straight. Bend your left / right knee as far as you can without feeling pain. Keep your hips flat against the floor. Hold this position for __________ seconds. Slowly lower your leg to  the starting position. Repeat __________ times. Complete this exercise __________ times a day. Squats This exercise strengthens the muscles in front of your thigh and knee (quadriceps). Stand in front of a table, with your feet and knees pointing straight ahead. You may rest your hands on the table for balance but not for support. Slowly bend your knees and lower your hips like you are going to sit in a chair. Keep your weight over your heels, not over your toes. Keep your lower legs upright so they are parallel with the table legs. Do not let your hips go lower than your knees. Do not bend lower than told by your health care provider. If your knee pain increases, do not bend as low. Hold the squat position for __________ seconds. Slowly push with your legs to return to standing. Do not use your hands to pull yourself to standing. Repeat __________ times. Complete this exercise  __________ times a day. Wall slides This exercise strengthens the muscles in front of your thigh and knee (quadriceps). Lean your back against a smooth wall or door, and walk your feet out 18-24 inches (46-61 cm) from it. Place your feet hip-width apart. Slowly slide down the wall or door until your knees bend __________ degrees. Keep your knees over your heels, not over your toes. Keep your knees in line with your hips. Hold this position for __________ seconds. Repeat __________ times. Complete this exercise __________ times a day. Straight leg raises This exercise strengthens the muscles that rotate the leg at the hip and move it away from your body (hip abductors). Lie on your side with your left / right leg in the top position. Lie so your head, shoulder, knee, and hip line up. You may bend your bottom knee to help you keep your balance. Roll your hips slightly forward so your hips are stacked directly over each other and your left / right knee is facing forward. Leading with your heel, lift your top leg 4-6  inches (10-15 cm). You should feel the muscles in your outer hip lifting. Do not let your foot drift forward. Do not let your knee roll toward the ceiling. Hold this position for __________ seconds. Slowly return your leg to the starting position. Let your muscles relax completely after each repetition. Repeat __________ times. Complete this exercise __________ times a day. Straight leg raises This exercise stretches the muscles that move your hips away from the front of the pelvis (hip extensors). Lie on your abdomen on a firm surface. You can put a pillow under your hips if that is more comfortable. Tense the muscles in your buttocks and lift your left / right leg about 4-6 inches (10-15 cm). Keep your knee straight as you lift your leg. Hold this position for __________ seconds. Slowly lower your leg to the starting position. Let your leg relax completely after each repetition. Repeat __________ times. Complete this exercise __________ times a day. This information is not intended to replace advice given to you by your health care provider. Make sure you discuss any questions you have with your health care provider. Document Revised: 09/21/2018 Document Reviewed: 09/21/2018 Elsevier Patient Education  2022 [place] [facility] Syndrome [facility] Ask your health care provider which exercises are safe for you. Do exercises exactly as told by your health care provider and adjust them as directed. It is normal to feel mild stretching, pulling, tightness, or discomfort as you do these exercises. Stop right away if you feel sudden pain or your pain gets significantly worse. Do not begin these exercises until told by your health care provider. Stretching and range-of-motion exercises These exercises warm up your muscles and joints and improve the movement and flexibility of your hip and pelvis. Quadriceps stretch, prone  Lie on your abdomen (prone position) on a firm surface, such as a bed  or padded floor. Bend your left / right knee and reach back to hold your ankle or pant leg. If you cannot reach your ankle or pant leg, loop a belt around your foot and grab the belt instead. Gently pull your heel toward your buttocks. Your knee should not slide out to the side. You should feel a stretch in the front of your thigh and knee (quadriceps). Hold this position for __________ seconds. Repeat __________ times. Complete this exercise __________ times a day. Iliotibial band stretch An iliotibial band is a strong band of muscle tissue  that runs from the outer side of your hip to the outer side of your thigh and knee. Lie on your side with your left / right leg in the top position. Bend both of your knees and grab your left / right ankle. Stretch out your bottom arm to help you balance. Slowly bring your top knee back so your thigh goes behind your trunk. Slowly lower your top leg toward the floor until you feel a gentle stretch on the outside of your left / right hip and thigh. If you do not feel a stretch and your knee will not fall farther, place the heel of your other foot on top of your knee and pull your knee down toward the floor with your foot. Hold this position for __________ seconds. Repeat __________ times. Complete this exercise __________ times a day. Strengthening exercises These exercises build strength and endurance in your hip and pelvis. Endurance is the ability to use your muscles for a long time, even after they get tired. Straight leg raises, side-lying This exercise strengthens the muscles that rotate the leg at the hip and move it away from your body (hip abductors). Lie on your side with your left / right leg in the top position. Lie so your head, shoulder, hip, and knee line up. You may bend your bottom knee to help you balance. Roll your hips slightly forward so your hips are stacked directly over each other and your left / right knee is facing forward. Tense the  muscles in your outer thigh and lift your top leg 4-6 inches (10-15 cm). Hold this position for __________ seconds. Slowly lower your leg to return to the starting position. Let your muscles relax completely before doing another repetition. Repeat __________ times. Complete this exercise __________ times a day. Leg raises, prone This exercise strengthens the muscles that move the hips backward (hip extensors). Lie on your abdomen (prone position) on your bed or a firm surface. You can put a pillow under your hips if that is more comfortable for your lower back. Bend your left / right knee so your foot is straight up in the air. Squeeze your buttocks muscles and lift your left / right thigh off the bed. Do not let your back arch. Tense your thigh muscle as hard as you can without increasing any knee pain. Hold this position for __________ seconds. Slowly lower your leg to return to the starting position and allow it to relax completely. Repeat __________ times. Complete this exercise __________ times a day. Hip hike Stand sideways on a bottom step. Stand on your left / right leg with your other foot unsupported next to the step. You can hold on to a railing or wall for balance if needed. Keep your knees straight and your torso square. Then lift your left / right hip up toward the ceiling. Slowly let your left / right hip lower toward the floor, past the starting position. Your foot should get closer to the floor. Do not lean or bend your knees. Repeat __________ times. Complete this exercise __________ times a day. This information is not intended to replace advice given to you by your health care provider. Make sure you discuss any questions you have with your health care provider. Document Revised: 02/08/2020 Document Reviewed: 02/08/2020 Elsevier Patient Education  [place]. Back Exercises The following exercises strengthen the muscles that help to support the trunk (torso) and  back. They also help to keep the lower back flexible. Doing  these exercises can help to prevent or lessen existing low back pain. If you have back pain or discomfort, try doing these exercises 2-3 times each day or as told by your health care provider. As your pain improves, do them once each day, but increase the number of times that you repeat the steps for each exercise (do more repetitions). To prevent the recurrence of back pain, continue to do these exercises once each day or as told by your health care provider. Do exercises exactly as told by your health care provider and adjust them as directed. It is normal to feel mild stretching, pulling, tightness, or discomfort as you do these exercises, but you should stop right away if you feel sudden pain or your pain gets worse. Exercises Single knee to chest Repeat these steps 3-5 times for each leg: Lie on your back on a firm bed or the floor with your legs extended. Bring one knee to your chest. Your other leg should stay extended and in contact with the floor. Hold your knee in place by grabbing your knee or thigh with both hands and hold. Pull on your knee until you feel a gentle stretch in your lower back or buttocks. Hold the stretch for 10-30 seconds. Slowly release and straighten your leg. Pelvic tilt Repeat these steps 5-10 times: Lie on your back on a firm bed or the floor with your legs extended. Bend your knees so they are pointing toward the ceiling and your feet are flat on the floor. Tighten your lower abdominal muscles to press your lower back against the floor. This motion will tilt your pelvis so your tailbone points up toward the ceiling instead of pointing to your feet or the floor. With gentle tension and even breathing, hold this position for 5-10 seconds. Cat-cow Repeat these steps until your lower back becomes more flexible: Get into a hands-and-knees position on a firm bed or the floor. Keep your hands under your  shoulders, and keep your knees under your hips. You may place padding under your knees for comfort. Let your head hang down toward your chest. Contract your abdominal muscles and point your tailbone toward the floor so your lower back becomes rounded like the back of a cat. Hold this position for 5 seconds. Slowly lift your head, let your abdominal muscles relax, and point your tailbone up toward the ceiling so your back forms a sagging arch like the back of a cow. Hold this position for 5 seconds.  Press-ups Repeat these steps 5-10 times: Lie on your abdomen (face-down) on a firm bed or the floor. Place your palms near your head, about shoulder-width apart. Keeping your back as relaxed as possible and keeping your hips on the floor, slowly straighten your arms to raise the top half of your body and lift your shoulders. Do not use your back muscles to raise your upper torso. You may adjust the placement of your hands to make yourself more comfortable. Hold this position for 5 seconds while you keep your back relaxed. Slowly return to lying flat on the floor.  Bridges Repeat these steps 10 times: Lie on your back on a firm bed or the floor. Bend your knees so they are pointing toward the ceiling and your feet are flat on the floor. Your arms should be flat at your sides, next to your body. Tighten your buttocks muscles and lift your buttocks off the floor until your waist is at almost the same height as your  knees. You should feel the muscles working in your buttocks and the back of your thighs. If you do not feel these muscles, slide your feet 1-2 inches (2.5-5 cm) farther away from your buttocks. Hold this position for 3-5 seconds. Slowly lower your hips to the starting position, and allow your buttocks muscles to relax completely. If this exercise is too easy, try doing it with your arms crossed over your chest. Abdominal crunches Repeat these steps 5-10 times: Lie on your back on a firm  bed or the floor with your legs extended. Bend your knees so they are pointing toward the ceiling and your feet are flat on the floor. Cross your arms over your chest. Tip your chin slightly toward your chest without bending your neck. Tighten your abdominal muscles and slowly raise your torso high enough to lift your shoulder blades a tiny bit off the floor. Avoid raising your torso higher than that because it can put too much stress on your lower back and does not help to strengthen your abdominal muscles. Slowly return to your starting position. Back lifts Repeat these steps 5-10 times: Lie on your abdomen (face-down) with your arms at your sides, and rest your forehead on the floor. Tighten the muscles in your legs and your buttocks. Slowly lift your chest off the floor while you keep your hips pressed to the floor. Keep the back of your head in line with the curve in your back. Your eyes should be looking at the floor. Hold this position for 3-5 seconds. Slowly return to your starting position. Contact a health care provider if: Your back pain or discomfort gets much worse when you do an exercise. Your worsening back pain or discomfort does not lessen within 2 hours after you exercise. If you have any of these problems, stop doing these exercises right away. Do not do them again unless your health care provider says that you can. Get help right away if: You develop sudden, severe back pain. If this happens, stop doing the exercises right away. Do not do them again unless your health care provider says that you can. This information is not intended to replace advice given to you by your health care provider. Make sure you discuss any questions you have with your health care provider. Document Revised: 02/13/2021 Document Reviewed: 02/13/2021 Elsevier Patient Education  [place].

## 2021-09-03 LAB — CBC WITH DIFFERENTIAL/PLATELET
Absolute Monocytes: 566 cells/uL (ref 200–950)
Basophils Absolute: 19 cells/uL (ref 0–200)
Basophils Relative: 0.4 %
Eosinophils Absolute: 120 cells/uL (ref 15–500)
Eosinophils Relative: 2.5 %
HCT: 40.6 % (ref 35.0–45.0)
Hemoglobin: 13.9 g/dL (ref 11.7–15.5)
Lymphs Abs: 1099 cells/uL (ref 850–3900)
MCH: 31 pg (ref 27.0–33.0)
MCHC: 34.2 g/dL (ref 32.0–36.0)
MCV: 90.4 fL (ref 80.0–100.0)
MPV: 11 fL (ref 7.5–12.5)
Monocytes Relative: 11.8 %
Neutro Abs: 2995 cells/uL (ref 1500–7800)
Neutrophils Relative %: 62.4 %
Platelets: 233 10*3/uL (ref 140–400)
RBC: 4.49 10*6/uL (ref 3.80–5.10)
RDW: 13.2 % (ref 11.0–15.0)
Total Lymphocyte: 22.9 %
WBC: 4.8 10*3/uL (ref 3.8–10.8)

## 2021-09-03 LAB — URINALYSIS, ROUTINE W REFLEX MICROSCOPIC
Bacteria, UA: NONE SEEN /HPF
Bilirubin Urine: NEGATIVE
Glucose, UA: NEGATIVE
Hgb urine dipstick: NEGATIVE
Hyaline Cast: NONE SEEN /LPF
Ketones, ur: NEGATIVE
Nitrite: NEGATIVE
Protein, ur: NEGATIVE
Specific Gravity, Urine: 1.02 (ref 1.001–1.035)
Squamous Epithelial / HPF: NONE SEEN /HPF (ref <=5)
pH: 6 (ref 5.0–8.0)

## 2021-09-03 LAB — COMPLETE METABOLIC PANEL WITHOUT GFR
AG Ratio: 1.6 (calc) (ref 1.0–2.5)
ALT: 22 U/L (ref 6–29)
AST: 23 U/L (ref 10–35)
Albumin: 4.3 g/dL (ref 3.6–5.1)
Alkaline phosphatase (APISO): 72 U/L (ref 37–153)
BUN: 16 mg/dL (ref 7–25)
CO2: 34 mmol/L — ABNORMAL HIGH (ref 20–32)
Calcium: 9.4 mg/dL (ref 8.6–10.4)
Chloride: 100 mmol/L (ref 98–110)
Creat: 0.84 mg/dL (ref 0.50–1.05)
Globulin: 2.7 g/dL (ref 1.9–3.7)
Glucose, Bld: 96 mg/dL (ref 65–99)
Potassium: 4.2 mmol/L (ref 3.5–5.3)
Sodium: 139 mmol/L (ref 135–146)
Total Bilirubin: 0.5 mg/dL (ref 0.2–1.2)
Total Protein: 7 g/dL (ref 6.1–8.1)
eGFR: 76 mL/min/{1.73_m2}

## 2021-09-03 LAB — SEDIMENTATION RATE: Sed Rate: 11 mm/h (ref 0–30)

## 2021-09-03 LAB — MICROSCOPIC MESSAGE

## 2021-09-03 LAB — ANTI-DNA ANTIBODY, DOUBLE-STRANDED: ds DNA Ab: <1 IU/mL

## 2021-09-03 LAB — C3 AND C4
C3 Complement: 165 mg/dL (ref 83–193)
C4 Complement: 31 mg/dL (ref 15–57)

## 2021-09-03 NOTE — Progress Notes (Signed)
CBC, CMP, sed rate, double-stranded ENA, complements are normal.  UA showed  calcium oxalate crystals.

## 2021-09-04 ENCOUNTER — Telehealth: Payer: Self-pay

## 2021-09-04 NOTE — Telephone Encounter (Signed)
I called patient, labs refaxed to [facility].

## 2021-09-04 NOTE — Telephone Encounter (Signed)
Patient called stating Dr. Estanislado Pandy was faxing her labwork results to her PCP [facility].  Patient states she called their office and was told they haven't received the results.  Patient states Dr. Estanislado Pandy told her the results of her urinalysis "has nothing to do with Lupus" and that she needed to contact her PCP to discuss those results.   Fax [phone]

## 2021-09-23 ENCOUNTER — Other Ambulatory Visit: Payer: Self-pay | Admitting: Rheumatology

## 2021-09-23 NOTE — Telephone Encounter (Signed)
Next Visit: 02/04/2022  Last Visit: 09/02/2021  Last Fill: 04/29/2021  DX: Other systemic lupus erythematosus with other organ involvement   Current Dose per office note 09/02/2021: MTX 0.3 ml sq once weekly   Labs: 09/02/2021 CBC, CMP, sed rate, double-stranded ENA, complements are normal.   Okay to refill MTX?

## 2021-09-30 ENCOUNTER — Other Ambulatory Visit: Payer: Self-pay | Admitting: Physician Assistant

## 2021-10-01 NOTE — Telephone Encounter (Signed)
Next Visit: 02/04/2022   Last Visit: 09/02/2021   Last Fill: 05/29/2021  Okay to refill Zofran?

## 2021-10-08 ENCOUNTER — Other Ambulatory Visit: Payer: Self-pay

## 2021-10-08 MED ORDER — MAGIC MOUTHWASH: 5.0000 mL | Freq: Four times a day (QID) | ORAL | 0 refills | Status: DC

## 2021-10-08 NOTE — Telephone Encounter (Signed)
Patient advised rx sent to pharmacy for magic mouthwash.

## 2021-10-08 NOTE — Telephone Encounter (Signed)
Ok to send in prescription for magic mouth wash. Please advise the patient to schedule a sooner follow up visit if her symptoms do not improve with the magic mouthwash.

## 2021-10-08 NOTE — Telephone Encounter (Signed)
Patient called stating approximately 2-3 days ago started experiencing sores in her mouth.  Patient states they are very painful and is having difficulty eating especially salty foods.  Patient requested a return call to let her know if there is anything Dr. Estanislado Pandy recommends.

## 2021-12-10 ENCOUNTER — Other Ambulatory Visit: Payer: Self-pay | Admitting: *Deleted

## 2021-12-10 DIAGNOSIS — Z79899 Other long term (current) drug therapy: Secondary | ICD-10-CM

## 2021-12-11 LAB — COMPLETE METABOLIC PANEL WITH GFR
AG Ratio: 1.6 (calc) (ref 1.0–2.5)
ALT: 30 U/L — ABNORMAL HIGH (ref 6–29)
AST: 35 U/L (ref 10–35)
Albumin: 4.1 g/dL (ref 3.6–5.1)
Alkaline phosphatase (APISO): 77 U/L (ref 37–153)
BUN: 14 mg/dL (ref 7–25)
CO2: 33 mmol/L — ABNORMAL HIGH (ref 20–32)
Calcium: 9.2 mg/dL (ref 8.6–10.4)
Chloride: 100 mmol/L (ref 98–110)
Creat: 0.75 mg/dL (ref 0.50–1.05)
Globulin: 2.5 g/dL (calc) (ref 1.9–3.7)
Glucose, Bld: 109 mg/dL — ABNORMAL HIGH (ref 65–99)
Potassium: 4.2 mmol/L (ref 3.5–5.3)
Sodium: 140 mmol/L (ref 135–146)
Total Bilirubin: 0.4 mg/dL (ref 0.2–1.2)
Total Protein: 6.6 g/dL (ref 6.1–8.1)
eGFR: 87 mL/min/{1.73_m2} (ref >=60)

## 2021-12-11 LAB — CBC WITH DIFFERENTIAL/PLATELET
Absolute Monocytes: 392 cells/uL (ref 200–950)
Basophils Absolute: 28 cells/uL (ref 0–200)
Basophils Relative: 0.5 %
Eosinophils Absolute: 101 cells/uL (ref 15–500)
Eosinophils Relative: 1.8 %
HCT: 38.8 % (ref 35.0–45.0)
Hemoglobin: 13.4 g/dL (ref 11.7–15.5)
Lymphs Abs: 1187 cells/uL (ref 850–3900)
MCH: 31.2 pg (ref 27.0–33.0)
MCHC: 34.5 g/dL (ref 32.0–36.0)
MCV: 90.2 fL (ref 80.0–100.0)
MPV: 11.3 fL (ref 7.5–12.5)
Monocytes Relative: 7 %
Neutro Abs: 3892 cells/uL (ref 1500–7800)
Neutrophils Relative %: 69.5 %
Platelets: 233 10*3/uL (ref 140–400)
RBC: 4.3 10*6/uL (ref 3.80–5.10)
RDW: 13 % (ref 11.0–15.0)
Total Lymphocyte: 21.2 %
WBC: 5.6 10*3/uL (ref 3.8–10.8)

## 2021-12-15 ENCOUNTER — Other Ambulatory Visit: Payer: Self-pay | Admitting: Physician Assistant

## 2021-12-17 NOTE — Telephone Encounter (Signed)
Next Visit: 02/04/2022  Last Visit: 09/02/2021  Last Fill: 09/24/2021  DX: Other systemic lupus erythematosus with other organ involvement   Current Dose per office note 09/02/2021: MTX 0.3 ml sq once weekly   Labs: 12/10/2021 CBC WNL. Glucose is 109.  ALT is borderline elevated-30. AST WNL. Rest of CMP WNL.    Okay to refill MTX?

## 2022-01-17 ENCOUNTER — Ambulatory Visit
Admission: EM | Admit: 2022-01-17 | Discharge: 2022-01-17 | Disposition: A | Discharge status: Home / Self Care | Payer: Medicare Other | Attending: Physician Assistant | Admitting: Physician Assistant

## 2022-01-17 ENCOUNTER — Encounter: Payer: Self-pay | Admitting: Physician Assistant

## 2022-01-17 ENCOUNTER — Other Ambulatory Visit: Payer: Self-pay

## 2022-01-17 ENCOUNTER — Ambulatory Visit (INDEPENDENT_AMBULATORY_CARE_PROVIDER_SITE_OTHER): Payer: Medicare Other

## 2022-01-17 DIAGNOSIS — S92352A Displaced fracture of fifth metatarsal bone, left foot, initial encounter for closed fracture: Secondary | ICD-10-CM

## 2022-01-17 DIAGNOSIS — S92812A Other fracture of left foot, initial encounter for closed fracture: Secondary | ICD-10-CM

## 2022-01-17 NOTE — ED Notes (Signed)
Pt offered cam boot. Pt requested to use crutches from home with the intent to go straight to ortho.

## 2022-01-17 NOTE — ED Triage Notes (Signed)
Pt c/o fall when getting out of car. Injured left foot. X3 days ago

## 2022-01-17 NOTE — ED Provider Notes (Signed)
[facility]    CSN: [id number] Arrival date & time: 01/17/22  1322      History   Chief Complaint Chief Complaint  Patient presents with   left foot injury    HPI Deborah Levy is a 66 y.o. female.   Patient here today for evaluation of left foot pain that started 3 days ago when she accidentally stepped on a small limb in her yard after the rain which caused her to slip and fall. She reports she stepped on the limb with her left foot and slid and then fell onto her right knee. She has some minor superficial abrasions to right knee but no pain otherwise in her knee. She has continued to have mild bruising and pain to lateral aspect of mid left foot with increased pain noted with weight bearing and movement of her foot. She does not report any numbness. She has tried keeping foot iced and has used compression without improvement of symptoms.   The history is provided by the patient.   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   Type 2 diabetes mellitus ([place]) 11/22/2014   Chronic pain 11/13/2014   Fibromyalgia 11/13/2014   Rotator cuff impingement syndrome 11/13/2014   Cannot sleep 02/13/2014   Essential (primary) hypertension 09/07/2013   HLD (hyperlipidemia) 09/07/2013   Adiposity 09/07/2013   Systemic lupus erythematosus ([place]) 05/21/2013   Hypokalemia 04/21/2013   Breast cancer of lower-inner quadrant of left female breast ([place]) 01/12/2012    Past Surgical History:   Procedure Laterality Date   BREAST LUMPECTOMY Left 2012   BREAST SURGERY  03/10/2011   Lt br lumpectomy   COLONOSCOPY     Removed 2 polpys    KNEE SURGERY     SHOULDER SURGERY     TUBAL LIGATION      OB History   No obstetric history on file.      Home Medications    Prior to Admission medications   Medication Sig Start Date End Date Taking? Authorizing Provider  aspirin 81 MG chewable tablet Chew by mouth.    [provider]  atorvastatin (LIPITOR) 20 MG tablet TAKE 1 TABLET BY MOUTH EVERY DAY 06/29/19   [provider]  BELBUCA 450 MCG FILM Take by mouth 2 (two) times daily. 08/24/20   [provider]  Biotin 5000 MCG CAPS Take 2 capsules by mouth daily.    [provider]  celecoxib (CELEBREX) 100 MG capsule TAKE 1 CAPSULE (100 MG TOTAL) BY MOUTH 2 TIMES DAILY FOR 30 DAYS. 02/11/21   [provider]  CHROMIUM PICOLINATE PO Take by mouth daily.    [provider]  Coenzyme Q10 (CO Q-10 PO) Take by mouth.    [provider]  Cyanocobalamin (B-12 PO) Take by mouth.    [provider]  diclofenac Sodium (VOLTAREN) 1 % GEL Apply topically. 05/29/20   [provider]  Docusate Calcium (STOOL SOFTENER PO) Take  by mouth daily.    [provider]  folic acid (FOLVITE) 1 MG tablet TAKE 1 TABLET BY MOUTH EVERY DAY 07/20/20   Bo Merino, MD  glipiZIDE (GLUCOTROL XL) 5 MG 24 hr tablet Take 2.5 mg by mouth daily.  06/25/19   [provider]  hydrochlorothiazide (HYDRODIURIL) 25 MG tablet TAKE 1 TABLET BY MOUTH EVERY DAY 08/29/19   [provider]  lisinopril (ZESTRIL) 10 MG tablet Take 10 mg by mouth daily. 09/20/20   [provider]  LYRICA 300 MG capsule Take 300 mg by mouth 2 (two) times daily.  06/04/17   [provider]  magic mouthwash SOLN Take 5 mLs by mouth 4 (four) times daily. 10/08/21   Ofilia Neas, PA-C  meclizine (ANTIVERT) 25 MG tablet Take 25 mg by  mouth 3 (three) times daily as needed. 11/21/19   [provider]  methocarbamol (ROBAXIN) 500 MG tablet Take 500 mg by mouth 2 (two) times daily as needed. 06/15/19   [provider]  methotrexate 50 MG/2ML injection INJECT 0.3 MLS (7.5 MG TOTAL) INTO THE SKIN ONCE A WEEK. 12/17/21   Ofilia Neas, PA-C  Multiple Vitamins-Minerals (MULTIVITAMIN ADULT PO) Take by mouth daily. Patient not taking: Reported on 09/02/2021    [provider]  omeprazole (PRILOSEC) 40 MG capsule Take 40 mg by mouth daily. 01/24/12   [provider]  ondansetron (ZOFRAN) 4 MG tablet TAKE 1 TABLET BY MOUTH EVERY 8 HOURS AS NEEDED FOR NAUSEA 10/01/21   Ofilia Neas, PA-C  oxybutynin (DITROPAN-XL) 5 MG 24 hr tablet Take 5 mg by mouth daily. 01/16/21   [provider]  rOPINIRole (REQUIP) 2 MG tablet Take 2 mg by mouth at bedtime.    [provider]  traMADol (ULTRAM) 50 MG tablet Take 100 mg by mouth as needed.  Patient not taking: Reported on 09/02/2021 07/04/15   Magrinat, Virgie Dad, MD  TUBERCULIN SYR 1CC/27GX1/2" (B-D TB SYRINGE 1CC/27GX1/2") 27G X 1/2" 1 ML MISC 12 Syringes by Does not apply route once a week. Patient to use to inject Methotrexate 05/11/20   Bo Merino, MD  TURMERIC PO Take by mouth daily.    [provider]  zolpidem (AMBIEN) 10 MG tablet Take 10 mg by mouth at bedtime.    [provider]  DULoxetine (CYMBALTA) 60 MG capsule Take 60 mg by mouth 2 (two) times daily.    02/23/12  [provider]  esomeprazole (NEXIUM) 40 MG capsule Take 40 mg by mouth daily.    02/23/12  [provider]    Family History Family History  Problem Relation Age of Onset   Cancer Father        colon   Hypertension Father    Heart disease Father    Cancer Sister        Cervical   Cancer Brother        prostate    Heart disease Maternal Grandfather    Hypertension Paternal Grandmother    Heart disease Paternal Grandmother    Breast  cancer Neg Hx     Social History Social History   Tobacco Use   Smoking status: Former    Packs/day: 1.00    Years: 20.00    Pack years: 20.00    Types: Cigarettes    Quit date: 08/30/1999    Years since quitting: 22.4   Smokeless tobacco: Never  Vaping Use   Vaping Use: Never used  Substance Use Topics  Alcohol use: No   Drug use: Never     Allergies   Sulfa antibiotics, Escitalopram, Metformin, and Sulfasalazine   Review of Systems Review of Systems  Constitutional:  Negative for chills and fever.  Eyes:  Negative for discharge and redness.  Respiratory:  Negative for shortness of breath.   Gastrointestinal:  Negative for abdominal pain, nausea and vomiting.  Musculoskeletal:  Positive for arthralgias and joint swelling.  Neurological:  Negative for numbness.    Physical Exam Triage Vital Signs ED Triage Vitals  Enc Vitals Group     BP      Pulse      Resp      Temp      Temp src      SpO2      Weight      Height      Head Circumference      Peak Flow      Pain Score      Pain Loc      Pain Edu?      Excl. in [place]?    No data found.  Updated Vital Signs BP 105/70 (BP Location: Right Arm)    Pulse 71    Temp 97.9 F (36.6 C) (Oral)    Resp 18    SpO2 96%      Physical Exam Vitals and nursing note reviewed.  Constitutional:      General: She is not in acute distress.    Appearance: Normal appearance. She is not ill-appearing.  HENT:     Head: Normocephalic and atraumatic.  Eyes:     Conjunctiva/sclera: Conjunctivae normal.  Cardiovascular:     Rate and Rhythm: Normal rate.  Pulmonary:     Effort: Pulmonary effort is normal.  Musculoskeletal:     Comments: Mild swelling and bruising noted to lateral left mid foot   Neurological:     Mental Status: She is alert.     Comments: Gross sensation intact to left toes distally  Psychiatric:        Mood and Affect: Mood normal.        Behavior: Behavior normal.        Thought Content: Thought  content normal.     UC Treatments / Results  Labs (all labs ordered are listed, but only abnormal results are displayed) Labs Reviewed - No data to display  EKG   Radiology DG Foot Complete Left  Result Date: 01/17/2022 CLINICAL DATA:  Fall getting out of car EXAM: LEFT FOOT - COMPLETE 3+ VIEW COMPARISON:  None. FINDINGS: Acute fracture through the base of the fifth metatarsal with mild distraction/displacement likely representing avulsion injury. No additional fracture. IMPRESSION: Acute avulsion fracture at the base of the fifth metatarsal with mild distraction/displacement. Electronically Signed   By: Macy Mis M.D.   On: 01/17/2022 14:17    Procedures Procedures (including critical care time)  Medications Ordered in UC Medications - No data to display  Initial Impression / Assessment and Plan / UC Course  I have reviewed the triage vital signs and the nursing notes.  Pertinent labs & imaging results that were available during my care of the patient were reviewed by me and considered in my medical decision making (see chart for details).    Avulsion fracture noted on xray. Recommended walking boot but patient has difficulty finding comfortable boot in office. Patient reports that she plans to visit ortho after leaving our office and has crutches at home. Discussed potential for increased  risk with any additional weight bearing and strongly encouraged patient to visit ortho ASAP.  Final Clinical Impressions(s) / UC Diagnoses   Final diagnoses:  Other fracture of left foot, initial encounter for closed fracture   Discharge Instructions   None    ED Prescriptions   None    PDMP not reviewed this encounter.   Francene Finders, PA-C 01/17/22 1450

## 2022-01-21 NOTE — Progress Notes (Signed)
Office Visit Note  Patient: Deborah Levy             Date of Birth: [birth year]           MRN: [id number]             PCP: Drosinis, Pamalee Leyden, PA-C Referring: Drosinis, Pamalee Leyden, PA-C Visit Date: 02/04/2022 Occupation: _0 @  Subjective:  Left foot pain   History of Present Illness: Deondrea Aguado is a 66 y.o. female with history of systemic lupus erythematosus and fibromyalgia.  Patient is on methotrexate 0.3 ml sq  injections once weekly and folic acid 1 mg by mouth daily.  She is on reduced dose of methotrexate due to history of elevated LFTs.  She has not missed any doses of methotrexate recently.  She denies any signs or symptoms of a lupus flare.  She experiences intermittent mouth sores but has not been taking folic acid on a consistent basis.  She has not had any nasal ulcers.  She has ongoing sicca symptoms which are unchanged.  She has not had any rashes, photosensitivity, Raynaud's phenomenon.  She continues to have ongoing hair thinning.  She has not had any shortness of breath, palpitations, or chest pain. Patient reports that 3 weeks ago she fell in her driveway and fractured her left fifth metatarsal.  She was evaluated by Dr. Gladstone Lighter and is currently wearing a boot. Her pain level has been tolerable.  She continues to have ongoing pain and stiffness in both hands.  She denies any increased discomfort in her knee joints currently.  She remains on tramadol, belbuca, and lyrica.     Activities of Daily Living:  Patient reports morning stiffness for 1.5-2 hours.   Patient Reports nocturnal pain.  Difficulty dressing/grooming: Denies Difficulty climbing stairs: Reports Difficulty getting out of chair: Denies Difficulty using hands for taps, buttons, cutlery, and/or writing: Denies  Review of Systems  Constitutional:  Positive for fatigue.  HENT:  Positive for mouth sores and mouth dryness. Negative for nose dryness.   Eyes:  Positive for dryness. Negative for pain  and itching.  Respiratory:  Negative for shortness of breath and difficulty breathing.   Cardiovascular:  Negative for chest pain and palpitations.  Gastrointestinal:  Positive for constipation. Negative for blood in stool and diarrhea.  Endocrine: Positive for increased urination.  Genitourinary:  Negative for difficulty urinating.  Musculoskeletal:  Positive for joint pain, joint pain, joint swelling and morning stiffness. Negative for myalgias, muscle tenderness and myalgias.  Skin:  Negative for color change, rash and redness.  Allergic/Immunologic: Negative for susceptible to infections.  Neurological:  Positive for headaches, memory loss and weakness. Negative for dizziness and numbness.  Hematological:  Positive for bruising/bleeding tendency.  Psychiatric/Behavioral:  Negative for confusion.    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   Type 2 diabetes mellitus ([place]) 11/22/2014   Chronic pain 11/13/2014   Fibromyalgia 11/13/2014   Rotator cuff impingement syndrome 11/13/2014   Cannot sleep 02/13/2014   Essential (primary) hypertension 09/07/2013   HLD (hyperlipidemia) 09/07/2013   Adiposity 09/07/2013   Systemic lupus erythematosus ([place]) 05/21/2013   Hypokalemia 04/21/2013   Breast cancer  of lower-inner quadrant of left female breast ([facility]) 01/12/2012    Past Medical History:  Diagnosis Date   Breast cancer ([place])    Cancer ([facility])    Fibromyalgia    GERD (gastroesophageal reflux disease)    Hypertension    Lupus ([place])     Family History  Problem Relation Age of Onset   Cancer Father        colon   Hypertension Father    Heart disease Father     Cancer Sister        Cervical   Cancer Brother        prostate    Heart disease Maternal Grandfather    Hypertension Paternal Grandmother    Heart disease Paternal Grandmother    Breast cancer Neg Hx    Past Surgical History:  Procedure Laterality Date   BREAST LUMPECTOMY Left 2012   BREAST SURGERY  03/10/2011   Lt br lumpectomy   COLONOSCOPY     Removed 2 polpys    KNEE SURGERY     SHOULDER SURGERY     TUBAL LIGATION     Social History   Social History Narrative   Not on file   Immunization History  Administered Date(s) Administered   PFIZER(Purple Top)SARS-COV-2 Vaccination 03/04/2020, 03/25/2020     Objective: Vital Signs: BP 115/77 (BP Location: Left Arm, Patient Position: Sitting, Cuff Size: Normal)    Pulse 61    Ht 5' (1.524 m)    Wt 168 lb (76.2 kg)    BMI 32.81 kg/m    Physical Exam Vitals and nursing note reviewed.  Constitutional:      Appearance: She is well-developed.  HENT:     Head: Normocephalic and atraumatic.  Eyes:     Conjunctiva/sclera: Conjunctivae normal.  Cardiovascular:     Rate and Rhythm: Normal rate and regular rhythm.     Heart sounds: Normal heart sounds.  Pulmonary:     Effort: Pulmonary effort is normal.     Breath sounds: Normal breath sounds.  Abdominal:     General: Bowel sounds are normal.     Palpations: Abdomen is soft.  Musculoskeletal:     Cervical back: Normal range of motion.  Lymphadenopathy:     Cervical: No cervical adenopathy.  Skin:    General: Skin is warm and dry.     Capillary Refill: Capillary refill takes less than 2 seconds.  Neurological:     Mental Status: She is alert and oriented to person, place, and time.  Psychiatric:        Behavior: Behavior normal.     Musculoskeletal Exam: C-spine, thoracic spine, and lumbar spine good ROM.  Shoulder joints, elbow joints, wrist joints, MCPs, PIPs, DIPs have good range of motion with no synovitis.  She has PIP and DIP thickening consistent with  osteoarthritis of both hands.  She has some tenderness over the right third and fourth PIP joints.  Complete fist formation bilaterally.  Hip joints have good range of motion with no groin pain.  Knee joints have good range of motion with no warmth or effusion.  Right ankle joint has good ROM with no discomfort. Left foot is in a boot.   CDAI Exam: CDAI Score: -- Patient Global: --; Provider Global: -- Swollen: --; Tender: -- Joint Exam 02/04/2022   No joint exam has been documented for this visit   There is currently no information documented on the homunculus. Go to the Rheumatology activity and complete the homunculus joint  exam.  Investigation: No additional findings.  Imaging: DG Foot Complete Left  Result Date: 01/17/2022 CLINICAL DATA:  Fall getting out of car EXAM: LEFT FOOT - COMPLETE 3+ VIEW COMPARISON:  None. FINDINGS: Acute fracture through the base of the fifth metatarsal with mild distraction/displacement likely representing avulsion injury. No additional fracture. IMPRESSION: Acute avulsion fracture at the base of the fifth metatarsal with mild distraction/displacement. Electronically Signed   By: Macy Mis M.D.   On: 01/17/2022 14:17    Recent Labs: Lab Results  Component Value Date   WBC 5.6 12/10/2021   HGB 13.4 12/10/2021   PLT 233 12/10/2021   NA 140 12/10/2021   K 4.2 12/10/2021   CL 100 12/10/2021   CO2 33 (H) 12/10/2021   GLUCOSE 109 (H) 12/10/2021   BUN 14 12/10/2021   CREATININE 0.75 12/10/2021   BILITOT 0.4 12/10/2021   ALKPHOS 86 07/01/2017   AST 35 12/10/2021   ALT 30 (H) 12/10/2021   PROT 6.6 12/10/2021   ALBUMIN 4.3 07/01/2017   CALCIUM 9.2 12/10/2021   GFRAA 99 02/26/2021    Speciality Comments: No specialty comments available.  Procedures:  No procedures performed Allergies: Sulfa antibiotics, Escitalopram, Metformin, and Sulfasalazine     Assessment / Plan:     Visit Diagnoses: Other systemic lupus erythematosus with other organ  involvement (HCC) - Fatigue, arthralgia, malar rash, photosensitivity, positive subcutaneous lupus on biopsy, Positive ANA, Ro+ and La +: She has not had any signs or symptoms of a systemic lupus flare recently.  She is clinically doing well on methotrexate 0.3 mL sq injections once weekly.  She has not missed any doses of methotrexate recently but has not been compliant taking folic acid daily.  She has had increased mouth sores and was strongly encouraged to restart on folic acid 1 mg daily.  A refill folic acid was sent to the pharmacy today.  A prescription for Magic mouthwash was also sent to the pharmacy today.  She has not had any nasal ulcerations.  She has not had any flares of cutaneous lupus or a malar rash recently.  She has not noticed any increased photosensitivity and was strongly encouraged to avoid direct sun exposure.  She has not had any symptoms of Raynaud's phenomenon.  She continues to have chronic sicca symptoms which have been unchanged.  Her energy level has been stable overall.  No synovitis was noted on examination today.  She has not had any shortness of breath, pleuritic chest pain, or palpitations.  Her lungs were clear to auscultation on examination today.  No cervical lymphadenopathy was noted. Autoimmune labs drawn on 09/02/2021 were reviewed today in the office: Double-stranded DNA negative, complements within normal limits, and ESR within normal limits.  CBC updated on 12/10/21 and CMP drawn on 12/31/2021. The following lab work will be updated today.   She was advised to notify us if she develops any new or worsening symptoms.  She will follow up in 5 months.    - Plan: Protein / creatinine ratio, urine, CBC with Differential/Platelet, COMPLETE METABOLIC PANEL WITH GFR, ANA, Anti-DNA antibody, double-stranded, C3 and C4, Sedimentation rate  High risk medication use - MTX 0.3 ml sq once weekly and folic acid 1 mg po daily. Reduced dose of MTX due to elevated LFTs.  CBC update  on 12/10/2021.  CMP drawn on 12/31/2021.  CBC and CMP will be updated today.  She will continue to require lab work every 3 months to monitor for drug toxicity.  Her next lab work will be due in May.- Plan: CBC with Differential/Platelet, COMPLETE METABOLIC PANEL WITH GFR She has not had any recent infections.  Discussed the importance of holding methotrexate if she develops signs or symptoms of an infection and to resume once the infection has completely cleared.    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  She has not had any increased photosensitivity.  She remains on methotrexate as prescribed.  Discussed the importance of avoiding direct sun exposure and wearing sunscreen SPF greater than 50 on a daily basis.  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  Primary osteoarthritis of left knee: She has good range of motion of the left knee joint on examination today.  No warmth or effusion was noted.  Trochanteric bursitis of both hips: Improved.  She had no tenderness upon palpation over the trochanteric bursa on examination today.  She experiences intermittent discomfort and was encouraged to perform stretching exercises on a regular basis.  Fibromyalgia: She experiences intermittent myalgias and muscle tenderness due to fibromyalgia.  She has been under increased stress for the past few months since her mother has moved in with her and her husband and she has had 2 move furniture and her belongings into their home.  She has had intermittent flares of fibromyalgia especially if she "overdoes it."  She remains on Belbuca, Lyrica, and tramadol for pain relief.  Discussed the importance of regular exercise and good sleep hygiene.  Other fatigue: Stable  Primary insomnia: Chronic.  She takes Ambien 10 mg at bedtime for insomnia.  Osteoporosis screening: She was found to have a left fifth  metatarsal fracture on 01/17/2022.  She has been under the care of Dr. Gladstone Lighter.  Patient was advised to update her bone density prior to her follow-up visit so we can discuss at that time.  Vitamin D level was also checked today.  Closed fracture of base of fifth metatarsal bone of left foot: X-rays on 01/17/22.  She is under the care of Dr. Gladstone Lighter and is currently wearing a boot. Patient was advised to have an updated bone density.  Vitamin D deficiency -Vitamin D level will be checked today.  Plan: VITAMIN D 25 Hydroxy (Vit-D Deficiency, Fractures)  Other medical conditions are listed as follows:  History of hyperlipidemia  History of hypertension: Blood pressure was 115/77 today in the office.  History of depression  History of diabetes mellitus  History of breast cancer  History of gastroesophageal reflux (GERD)    Orders: Orders Placed This Encounter  Procedures   Protein / creatinine ratio, urine   CBC with Differential/Platelet   COMPLETE METABOLIC PANEL WITH GFR   ANA   Anti-DNA antibody, double-stranded   C3 and C4   Sedimentation rate   VITAMIN D 25 Hydroxy (Vit-D Deficiency, Fractures)   Meds ordered this encounter  Medications   folic acid (FOLVITE) 1 MG tablet    Sig: Take 1 tablet (1 mg total) by mouth daily.    Dispense:  90 tablet    Refill:  4   magic mouthwash SOLN    Sig: Take 5 mLs by mouth 4 (four) times daily.    Dispense:  240 mL    Refill:  0    Benadryl, Hydrocortisone, Nystatin 1:1:1 ratio    Follow-Up Instructions: Return in about 5 months (around 07/04/2022) for Systemic lupus erythematosus, Fibromyalgia.   Ofilia Neas, PA-C  Note - This record has been created using [employer].  Chart creation errors have been sought, but may not always  have been located. Such creation errors do not reflect on  the standard of medical care.

## 2022-02-04 ENCOUNTER — Encounter: Payer: Self-pay | Admitting: Physician Assistant

## 2022-02-04 ENCOUNTER — Other Ambulatory Visit: Payer: Self-pay

## 2022-02-04 ENCOUNTER — Ambulatory Visit (INDEPENDENT_AMBULATORY_CARE_PROVIDER_SITE_OTHER): Payer: Medicare Other | Admitting: Physician Assistant

## 2022-02-04 ENCOUNTER — Ambulatory Visit: Payer: BC Managed Care – PPO | Admitting: Physician Assistant

## 2022-02-04 VITALS — BP 115/77 | HR 61 | Ht 60.0 in | Wt 168.0 lb

## 2022-02-04 DIAGNOSIS — M3219 Other organ or system involvement in systemic lupus erythematosus: Secondary | ICD-10-CM | POA: Diagnosis not present

## 2022-02-04 DIAGNOSIS — F5101 Primary insomnia: Secondary | ICD-10-CM

## 2022-02-04 DIAGNOSIS — Z8639 Personal history of other endocrine, nutritional and metabolic disease: Secondary | ICD-10-CM

## 2022-02-04 DIAGNOSIS — L932 Other local lupus erythematosus: Secondary | ICD-10-CM | POA: Diagnosis not present

## 2022-02-04 DIAGNOSIS — R5383 Other fatigue: Secondary | ICD-10-CM

## 2022-02-04 DIAGNOSIS — M7061 Trochanteric bursitis, right hip: Secondary | ICD-10-CM

## 2022-02-04 DIAGNOSIS — M797 Fibromyalgia: Secondary | ICD-10-CM

## 2022-02-04 DIAGNOSIS — M1712 Unilateral primary osteoarthritis, left knee: Secondary | ICD-10-CM

## 2022-02-04 DIAGNOSIS — Z79899 Other long term (current) drug therapy: Secondary | ICD-10-CM

## 2022-02-04 DIAGNOSIS — Z8679 Personal history of other diseases of the circulatory system: Secondary | ICD-10-CM

## 2022-02-04 DIAGNOSIS — Z8659 Personal history of other mental and behavioral disorders: Secondary | ICD-10-CM

## 2022-02-04 DIAGNOSIS — T372X5A Adverse effect of antimalarials and drugs acting on other blood protozoa, initial encounter: Secondary | ICD-10-CM

## 2022-02-04 DIAGNOSIS — Z853 Personal history of malignant neoplasm of breast: Secondary | ICD-10-CM

## 2022-02-04 DIAGNOSIS — Z8719 Personal history of other diseases of the digestive system: Secondary | ICD-10-CM

## 2022-02-04 DIAGNOSIS — Z13820 Encounter for screening for osteoporosis: Secondary | ICD-10-CM

## 2022-02-04 DIAGNOSIS — E559 Vitamin D deficiency, unspecified: Secondary | ICD-10-CM

## 2022-02-04 DIAGNOSIS — H35389 Toxic maculopathy, unspecified eye: Secondary | ICD-10-CM

## 2022-02-04 DIAGNOSIS — S92352A Displaced fracture of fifth metatarsal bone, left foot, initial encounter for closed fracture: Secondary | ICD-10-CM

## 2022-02-04 DIAGNOSIS — M7062 Trochanteric bursitis, left hip: Secondary | ICD-10-CM

## 2022-02-04 MED ORDER — MAGIC MOUTHWASH: 5.0000 mL | Freq: Four times a day (QID) | ORAL | 0 refills | Status: DC

## 2022-02-04 MED ORDER — FOLIC ACID 1 MG PO TABS: 1.0000 mg | ORAL_TABLET | Freq: Every day | ORAL | 4 refills | Status: DC

## 2022-02-04 NOTE — Patient Instructions (Signed)
Standing Labs °We placed an order today for your standing lab work.  ° °Please have your standing labs drawn in May and every 3 months  ° ° °If possible, please have your labs drawn 2 weeks prior to your appointment so that the provider can discuss your results at your appointment. ° °Please note that you may see your imaging and lab results in MyChart before we have reviewed them. °We may be awaiting multiple results to interpret others before contacting you. °Please allow our office up to 72 hours to thoroughly review all of the results before contacting the office for clarification of your results. ° °We have open lab daily: °Monday through Thursday from 1:30-4:30 PM and Friday from 1:30-4:00 PM °at the office of Dr. Shaili Deveshwar, [facility].   °Please be advised, all patients with office appointments requiring lab work will take precedent over walk-in lab work.  °If possible, please come for your lab work on Monday and Friday afternoons, as you may experience shorter wait times. °The office is located at [address], [address], [place], [place] [address] °No appointment is necessary.   °Labs are drawn by [facility]. Please bring your co-pay at the time of your lab draw.  You may receive a bill from [facility] for your lab work. ° °Please note if you are on Hydroxychloroquine and and an order has been placed for a Hydroxychloroquine level, you will need to have it drawn 4 hours or more after your last dose. ° °If you wish to have your labs drawn at another location, please call the office 24 hours in advance to send orders. ° °If you have any questions regarding directions or hours of operation,  °please call [phone].   °As a reminder, please drink plenty of water prior to coming for your lab work. Thanks! ° °

## 2022-02-05 ENCOUNTER — Other Ambulatory Visit: Payer: Self-pay | Admitting: *Deleted

## 2022-02-05 NOTE — Telephone Encounter (Signed)
Received a fax from [facility] about the magic mouthwash prescription. They state they are unable to fill the prescription with the Hydrocortisone tabs stating they are no longer allowed to compound with the tablets. Spoke with patient to see if she is willing to pick up the prescription at [facility]. Patient states she is but would like to try taking the Folic Acid and call back if she needs the magic mouthwash.

## 2022-02-05 NOTE — Progress Notes (Signed)
ALT is borderline elevated but stable. AST WNL.  Rest of CMP WNL.  CBC WNL.   Protein creatinine ratio WNL.   ESR and complements WNL.   Vitamin D WNL.

## 2022-02-06 LAB — COMPLETE METABOLIC PANEL WITH GFR
AG Ratio: 1.7 (calc) (ref 1.0–2.5)
ALT: 30 U/L — ABNORMAL HIGH (ref 6–29)
AST: 28 U/L (ref 10–35)
Albumin: 4.3 g/dL (ref 3.6–5.1)
Alkaline phosphatase (APISO): 77 U/L (ref 37–153)
BUN: 17 mg/dL (ref 7–25)
CO2: 32 mmol/L (ref 20–32)
Calcium: 9.6 mg/dL (ref 8.6–10.4)
Chloride: 103 mmol/L (ref 98–110)
Creat: 0.79 mg/dL (ref 0.50–1.05)
Globulin: 2.6 g/dL (calc) (ref 1.9–3.7)
Glucose, Bld: 94 mg/dL (ref 65–99)
Potassium: 4.6 mmol/L (ref 3.5–5.3)
Sodium: 143 mmol/L (ref 135–146)
Total Bilirubin: 0.5 mg/dL (ref 0.2–1.2)
Total Protein: 6.9 g/dL (ref 6.1–8.1)
eGFR: 82 mL/min/{1.73_m2} (ref >=60)

## 2022-02-06 LAB — CBC WITH DIFFERENTIAL/PLATELET
Absolute Monocytes: 447 cells/uL (ref 200–950)
Basophils Absolute: 21 cells/uL (ref 0–200)
Basophils Relative: 0.5 %
Eosinophils Absolute: 131 cells/uL (ref 15–500)
Eosinophils Relative: 3.2 %
HCT: 40.3 % (ref 35.0–45.0)
Hemoglobin: 13.7 g/dL (ref 11.7–15.5)
Lymphs Abs: 1394 cells/uL (ref 850–3900)
MCH: 30.2 pg (ref 27.0–33.0)
MCHC: 34 g/dL (ref 32.0–36.0)
MCV: 88.8 fL (ref 80.0–100.0)
MPV: 10.6 fL (ref 7.5–12.5)
Monocytes Relative: 10.9 %
Neutro Abs: 2107 cells/uL (ref 1500–7800)
Neutrophils Relative %: 51.4 %
Platelets: 250 10*3/uL (ref 140–400)
RBC: 4.54 10*6/uL (ref 3.80–5.10)
RDW: 14 % (ref 11.0–15.0)
Total Lymphocyte: 34 %
WBC: 4.1 10*3/uL (ref 3.8–10.8)

## 2022-02-06 LAB — PROTEIN / CREATININE RATIO, URINE
Creatinine, Urine: 112 mg/dL (ref 20–275)
Protein/Creat Ratio: 80 mg/g creat (ref 24–184)
Protein/Creatinine Ratio: 0.08 mg/mg creat (ref 0.024–0.184)
Total Protein, Urine: 9 mg/dL (ref 5–24)

## 2022-02-06 LAB — ANTI-DNA ANTIBODY, DOUBLE-STRANDED: ds DNA Ab: 1 IU/mL

## 2022-02-06 LAB — C3 AND C4
C3 Complement: 162 mg/dL (ref 83–193)
C4 Complement: 30 mg/dL (ref 15–57)

## 2022-02-06 LAB — VITAMIN D 25 HYDROXY (VIT D DEFICIENCY, FRACTURES): Vit D, 25-Hydroxy: 44 ng/mL (ref 30–100)

## 2022-02-06 LAB — SEDIMENTATION RATE: Sed Rate: 11 mm/h (ref 0–30)

## 2022-02-06 LAB — ANTI-NUCLEAR AB-TITER (ANA TITER)
ANA TITER: 1:1280 {titer} — ABNORMAL HIGH
ANA Titer 1: 1:320 {titer} — ABNORMAL HIGH

## 2022-02-06 LAB — ANA: Anti Nuclear Antibody (ANA): POSITIVE — AB

## 2022-02-06 NOTE — Progress Notes (Signed)
dsDNA is negative.  Labs are not consistent with a flare.

## 2022-03-15 ENCOUNTER — Other Ambulatory Visit: Payer: Self-pay | Admitting: Physician Assistant

## 2022-03-17 NOTE — Telephone Encounter (Signed)
Next Visit: 07/02/2022 ? ?Last Visit: 02/04/2022 ? ?Last Fill: 12/17/2021 ? ?DX: Other systemic lupus erythematosus with other organ involvement ? ?Current Dose per office note 02/04/2022: MTX 0.3 ml sq once weekly  ? ?Labs: 02/04/2022 ALT is borderline elevated but stable. AST WNL.  Rest of CMP WNL.  CBC WNL.   ? ?Okay to refill MTX?  ?

## 2022-03-17 NOTE — Telephone Encounter (Signed)
Spoke with patient and advised patient to avoid taking celebrex while taking methotrexate. Patient expressed understanding.  ?

## 2022-03-17 NOTE — Telephone Encounter (Signed)
Please advise the patient to avoid taking celebrex while taking methotrexate.

## 2022-04-08 ENCOUNTER — Other Ambulatory Visit: Payer: Self-pay | Admitting: Physician Assistant

## 2022-04-09 NOTE — Telephone Encounter (Signed)
Next Visit: 07/02/2022 ?  ?Last Visit: 02/04/2022 ?  ?Last Fill: 10/01/2021 ? ?DX: Other systemic lupus erythematosus with other organ involvement ? ?Okay to refill Zofran?  ?

## 2022-06-04 ENCOUNTER — Telehealth: Payer: Self-pay | Admitting: Rheumatology

## 2022-06-04 NOTE — Telephone Encounter (Signed)
Deborah Levy from [facility] called the office requesting a refill of the medications:   Folic acid '1mg'$  Methotrexate '50mg'$ /42m Tramadol '50mg'$   Phone #[phone] Fax #[phone]

## 2022-06-04 NOTE — Telephone Encounter (Signed)
Patient states she is switching pharmacies. Patient advised we are unable to refill medications until her labs have been updated. Patient also advised that the pharmacy is requesting Tramadol and we do not fill this medication for patient.

## 2022-06-06 ENCOUNTER — Other Ambulatory Visit: Payer: Self-pay | Admitting: *Deleted

## 2022-06-06 DIAGNOSIS — Z79899 Other long term (current) drug therapy: Secondary | ICD-10-CM

## 2022-06-07 ENCOUNTER — Other Ambulatory Visit: Payer: Self-pay | Admitting: Physician Assistant

## 2022-06-07 LAB — CBC WITH DIFFERENTIAL/PLATELET
Absolute Monocytes: 350 cells/uL (ref 200–950)
Basophils Absolute: 20 cells/uL (ref 0–200)
Basophils Relative: 0.4 %
Eosinophils Absolute: 50 cells/uL (ref 15–500)
Eosinophils Relative: 1 %
HCT: 39.9 % (ref 35.0–45.0)
Hemoglobin: 13.6 g/dL (ref 11.7–15.5)
Lymphs Abs: 1360 cells/uL (ref 850–3900)
MCH: 30.7 pg (ref 27.0–33.0)
MCHC: 34.1 g/dL (ref 32.0–36.0)
MCV: 90.1 fL (ref 80.0–100.0)
MPV: 10.3 fL (ref 7.5–12.5)
Monocytes Relative: 7 %
Neutro Abs: 3220 cells/uL (ref 1500–7800)
Neutrophils Relative %: 64.4 %
Platelets: 257 10*3/uL (ref 140–400)
RBC: 4.43 10*6/uL (ref 3.80–5.10)
RDW: 13.4 % (ref 11.0–15.0)
Total Lymphocyte: 27.2 %
WBC: 5 10*3/uL (ref 3.8–10.8)

## 2022-06-07 LAB — COMPLETE METABOLIC PANEL WITH GFR
AG Ratio: 1.5 (calc) (ref 1.0–2.5)
ALT: 20 U/L (ref 6–29)
AST: 23 U/L (ref 10–35)
Albumin: 4.1 g/dL (ref 3.6–5.1)
Alkaline phosphatase (APISO): 77 U/L (ref 37–153)
BUN: 16 mg/dL (ref 7–25)
CO2: 34 mmol/L — ABNORMAL HIGH (ref 20–32)
Calcium: 9.2 mg/dL (ref 8.6–10.4)
Chloride: 100 mmol/L (ref 98–110)
Creat: 0.76 mg/dL (ref 0.50–1.05)
Globulin: 2.7 g/dL (calc) (ref 1.9–3.7)
Glucose, Bld: 105 mg/dL — ABNORMAL HIGH (ref 65–99)
Potassium: 4.3 mmol/L (ref 3.5–5.3)
Sodium: 140 mmol/L (ref 135–146)
Total Bilirubin: 0.4 mg/dL (ref 0.2–1.2)
Total Protein: 6.8 g/dL (ref 6.1–8.1)
eGFR: 86 mL/min/{1.73_m2} (ref >=60)

## 2022-06-09 ENCOUNTER — Other Ambulatory Visit: Payer: Self-pay | Admitting: Physician Assistant

## 2022-06-10 ENCOUNTER — Telehealth: Payer: Self-pay | Admitting: Rheumatology

## 2022-06-10 MED ORDER — METHOTREXATE SODIUM CHEMO INJECTION 50 MG/2ML: 7.5000 mg | INTRAMUSCULAR | 0 refills | Status: DC

## 2022-06-10 NOTE — Telephone Encounter (Signed)
Vial of MTX on back order. Okay to switch to tablets?

## 2022-06-13 ENCOUNTER — Other Ambulatory Visit: Payer: Self-pay | Admitting: Rheumatology

## 2022-06-13 MED ORDER — FOLIC ACID 1 MG PO TABS
1.0000 mg | ORAL_TABLET | Freq: Every day | ORAL | 4 refills | Status: DC
Start: 2022-06-13 — End: 2022-09-17

## 2022-06-13 NOTE — Telephone Encounter (Signed)
Spoke with Jinny Blossom at [facility] and advised we would work on getting the Folic Acid sent to them but that we do not prescribe Tramadol.   Next Visit: 07/02/2022  Last Visit: 02/04/2022  Dx: Other systemic lupus erythematosus with other organ involvement  Current Dose per office note on 08/13/1598: folic acid 1 mg po daily.   Okay to refill Folic Acid?

## 2022-06-13 NOTE — Telephone Encounter (Signed)
[facility] left a voicemail stating they are following up on some medication refill requests that were faxed over for provider Deborah Levy.  We faxed them on 06/04/22 and used fax [phone].  Patient is going to start using our services.  Once we get all the medications in place for her we can mail them out monthly.  Patient states she receives her folic acid and tramadol 50 mg from this provider.  Please send new prescriptions or if there are issues where you can't send them for example patient needs an appointment or no longer taking, please call back at [phone]. Fax [phone]

## 2022-06-20 NOTE — Progress Notes (Signed)
Office Visit Note  Patient: Deborah Levy             Date of Birth: 1956/10/25           MRN: [id number]             PCP: Drosinis, Pamalee Leyden, PA-C Referring: Drosinis, Pamalee Leyden, PA-C Visit Date: 07/02/2022 Occupation: '@GUAROCC'$ @  Subjective:  Medication management  History of Present Illness: Deborah Levy is a 66 y.o. female with history of systemic lupus erythematosus, osteoarthritis and fibromyalgia syndrome.  She states that she noticed bruise on her right cheek last month and it has not resolved.  She has not noticed any other rash.  She gives history of fatigue, dry mouth, dry eyes, and photosensitivity.  She denies any history of oral ulcers, nasal ulcers, malar rash, Raynaud's phenomenon or inflammatory arthritis.  She continues to have some generalized pain and discomfort from fibromyalgia.She has been taking methotrexate on a regular basis.  She has occasional discomfort in her left knee joint and off-and-on discomfort in her trochanteric area.  Activities of Daily Living:  Patient reports morning stiffness for 2 hours.   Patient Denies nocturnal pain.  Difficulty dressing/grooming: Denies Difficulty climbing stairs: Denies Difficulty getting out of chair: Denies Difficulty using hands for taps, buttons, cutlery, and/or writing: Denies  Review of Systems  Constitutional:  Positive for fatigue.  HENT:  Positive for mouth dryness. Negative for mouth sores.   Eyes:  Positive for dryness.  Respiratory:  Negative for shortness of breath.   Cardiovascular:  Negative for chest pain and palpitations.  Gastrointestinal:  Positive for constipation. Negative for blood in stool and diarrhea.  Endocrine: Negative for increased urination.  Genitourinary:  Positive for difficulty urinating. Negative for involuntary urination.  Musculoskeletal:  Positive for myalgias, morning stiffness, muscle tenderness and myalgias. Negative for joint pain, joint pain, joint swelling and muscle  weakness.  Skin:  Positive for rash and sensitivity to sunlight. Negative for color change and hair loss.  Allergic/Immunologic: Negative for susceptible to infections.  Neurological:  Positive for dizziness and headaches.  Hematological:  Negative for swollen glands.  Psychiatric/Behavioral:  Positive for depressed mood. Negative for sleep disturbance. The patient is nervous/anxious.     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   Type 2 diabetes mellitus ([place]) 11/22/2014   Chronic pain 11/13/2014   Fibromyalgia 11/13/2014   Rotator cuff impingement syndrome 11/13/2014   Cannot sleep 02/13/2014   Essential (primary) hypertension 09/07/2013   HLD (hyperlipidemia) 09/07/2013   Adiposity 09/07/2013   Systemic lupus erythematosus ([place]) 05/21/2013   Hypokalemia 04/21/2013   Breast cancer of lower-inner quadrant of left female breast ([place]) 01/12/2012    Past Medical History:  Diagnosis Date   Breast cancer ([place])    Cancer ([place])    Fibromyalgia    GERD (gastroesophageal reflux disease)    Hypertension    Lupus ([place])     Family History  Problem Relation Age of Onset   Cancer Father        colon   Hypertension Father    Heart disease Father    Cancer Sister  Cervical   Cancer Brother        prostate    Heart disease Maternal Grandfather    Hypertension Paternal Grandmother    Heart disease Paternal Grandmother    Breast cancer Neg Hx    Past Surgical History:  Procedure Laterality Date   BREAST LUMPECTOMY Left 2012   BREAST SURGERY  03/10/2011   Lt br lumpectomy   COLONOSCOPY     Removed 2 polpys    KNEE SURGERY     SHOULDER SURGERY      TUBAL LIGATION     Social History   Social History Narrative   Not on file   Immunization History  Administered Date(s) Administered   PFIZER(Purple Top)SARS-COV-2 Vaccination 03/04/2020, 03/25/2020     Objective: Vital Signs: BP 118/77 (BP Location: Right Arm, Patient Position: Sitting, Cuff Size: Normal)   Pulse 69   Ht 5' (1.524 m)   Wt 173 lb 3.2 oz (78.6 kg)   BMI 33.83 kg/m    Physical Exam Vitals and nursing note reviewed.  Constitutional:      Appearance: She is well-developed.  HENT:     Head: Normocephalic and atraumatic.  Eyes:     Conjunctiva/sclera: Conjunctivae normal.  Cardiovascular:     Rate and Rhythm: Normal rate and regular rhythm.     Heart sounds: Normal heart sounds.  Pulmonary:     Effort: Pulmonary effort is normal.     Breath sounds: Normal breath sounds.  Abdominal:     General: Bowel sounds are normal.     Palpations: Abdomen is soft.  Musculoskeletal:     Cervical back: Normal range of motion.  Lymphadenopathy:     Cervical: No cervical adenopathy.  Skin:    General: Skin is warm and dry.     Capillary Refill: Capillary refill takes less than 2 seconds.     Comments: Small hyperpigmented lesion was noted on her right cheek.  Neurological:     Mental Status: She is alert and oriented to person, place, and time.  Psychiatric:        Behavior: Behavior normal.      Musculoskeletal Exam: C-spine was in good range of motion.  Shoulder joints, elbow joints, wrist joints, MCPs PIPs and DIPs with good range of motion with no synovitis.  Hip joints, knee joints, ankles, MTPs and PIPs with good range of motion with no synovitis.  CDAI Exam: CDAI Score: -- Patient Global: --; Provider Global: -- Swollen: --; Tender: -- Joint Exam 07/02/2022   No joint exam has been documented for this visit   There is currently no information documented on the homunculus. Go to the Rheumatology activity and complete the homunculus joint  exam.  Investigation: No additional findings.  Imaging: No results found.  Recent Labs: Lab Results  Component Value Date   WBC 5.0 06/06/2022   HGB 13.6 06/06/2022   PLT 257 06/06/2022   NA 140 06/06/2022   K 4.3 06/06/2022   CL 100 06/06/2022   CO2 34 (H) 06/06/2022   GLUCOSE 105 (H) 06/06/2022   BUN 16 06/06/2022   CREATININE 0.76 06/06/2022   BILITOT 0.4 06/06/2022   ALKPHOS 86 07/01/2017   AST 23 06/06/2022   ALT 20 06/06/2022   PROT 6.8 06/06/2022   ALBUMIN 4.3 07/01/2017   CALCIUM 9.2 06/06/2022   GFRAA 99 02/26/2021    Speciality Comments: No specialty comments available.  Procedures:  No procedures performed Allergies: Sulfa antibiotics, Escitalopram, Metformin, and Sulfasalazine   Assessment /  Plan:     Visit Diagnoses: Other systemic lupus erythematosus with other organ involvement (HCC) - Fatigue, arthralgia, malar rash, photosensitivity, positive subcutaneous lupus on biopsy, Positive ANA, Ro+ and La +:  -She continues to have fatigue, sicca symptoms, photosensitivity.  She denies any history of malar rash, oral ulcers, nasal ulcers, Raynaud's phenomenon.  She developed a skin rash on her right cheek.  She will schedule an appointment with the dermatologist.  Plan: Protein / creatinine ratio, urine, Anti-DNA antibody, double-stranded, C3 and C4, Sedimentation rate  High risk medication use - MTX 0.3 ml sq once weekly and folic acid 1 mg po daily. Reduced dose of MTX due to elevated LFTs.  Labs obtained on June 06, 2022 which include CBC with differential and CMP with GFR were normal.  She was advised to get labs every 3 months.  We will get autoimmune labs with her next labs.  Information regarding immunization was placed in the AVS.  She was also advised to stop methotrexate if she develops an infection and resume after the infection resolves.  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  She had hyperpigmented lesion  on her right cheek.  I advised her to schedule an appointment with the dermatologist.  Patient is going to the beach.  Nancy Fetter protection was discussed at length.  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  Plan: Rheumatoid factor  Trochanteric bursitis of both hips-IT band stretches were discussed.  Fibromyalgia-she continues to have intermittent fibromyalgia flare.  Regular exercise and stretching was emphasized.  Primary insomnia -she states her insomnia is controlled on Ambien 10 mg at bedtime.  Other fatigue -she has been experiencing increased fatigue.  Plan: Serum protein electrophoresis with reflex  Osteoporosis screening-I do not see a DEXA scan since 2014.  She was advised to get a bone density.  Vitamin D deficiency-her vitamin D was normal on February 04, 2022.  Closed fracture of base of fifth metatarsal bone of left foot - X-rays on 01/17/22.   Other medical problems listed as Follows:  History of hypertension  History of hyperlipidemia  History of diabetes mellitus  History of gastroesophageal reflux (GERD)  History of breast cancer  History of depression  Orders: Orders Placed This Encounter  Procedures   Protein / creatinine ratio, urine   Anti-DNA antibody, double-stranded   C3 and C4   Sedimentation rate   Rheumatoid factor   Serum protein electrophoresis with reflex   No orders of the defined types were placed in this encounter.    Follow-Up Instructions: Return in about 5 months (around 12/02/2022) for Systemic lupus.   Bo Merino, MD  Note - This record has been created using Editor, commissioning.  Chart creation errors have been sought, but may not always  have been located. Such creation errors do not reflect on  the standard of medical care.

## 2022-06-27 ENCOUNTER — Other Ambulatory Visit: Payer: Self-pay | Admitting: Rheumatology

## 2022-07-01 ENCOUNTER — Other Ambulatory Visit: Payer: Self-pay | Admitting: Obstetrics and Gynecology

## 2022-07-01 DIAGNOSIS — Z1231 Encounter for screening mammogram for malignant neoplasm of breast: Secondary | ICD-10-CM

## 2022-07-02 ENCOUNTER — Ambulatory Visit: Payer: Medicare Other | Admitting: Rheumatology

## 2022-07-02 ENCOUNTER — Encounter: Payer: Self-pay | Admitting: Rheumatology

## 2022-07-02 VITALS — BP 118/77 | HR 69 | Ht 60.0 in | Wt 173.2 lb

## 2022-07-02 DIAGNOSIS — S92352A Displaced fracture of fifth metatarsal bone, left foot, initial encounter for closed fracture: Secondary | ICD-10-CM

## 2022-07-02 DIAGNOSIS — H35389 Toxic maculopathy, unspecified eye: Secondary | ICD-10-CM

## 2022-07-02 DIAGNOSIS — M3219 Other organ or system involvement in systemic lupus erythematosus: Secondary | ICD-10-CM | POA: Diagnosis not present

## 2022-07-02 DIAGNOSIS — Z79899 Other long term (current) drug therapy: Secondary | ICD-10-CM

## 2022-07-02 DIAGNOSIS — Z13820 Encounter for screening for osteoporosis: Secondary | ICD-10-CM

## 2022-07-02 DIAGNOSIS — T372X5A Adverse effect of antimalarials and drugs acting on other blood protozoa, initial encounter: Secondary | ICD-10-CM

## 2022-07-02 DIAGNOSIS — E559 Vitamin D deficiency, unspecified: Secondary | ICD-10-CM

## 2022-07-02 DIAGNOSIS — R5383 Other fatigue: Secondary | ICD-10-CM

## 2022-07-02 DIAGNOSIS — Z853 Personal history of malignant neoplasm of breast: Secondary | ICD-10-CM

## 2022-07-02 DIAGNOSIS — L932 Other local lupus erythematosus: Secondary | ICD-10-CM | POA: Diagnosis not present

## 2022-07-02 DIAGNOSIS — M797 Fibromyalgia: Secondary | ICD-10-CM

## 2022-07-02 DIAGNOSIS — M7062 Trochanteric bursitis, left hip: Secondary | ICD-10-CM

## 2022-07-02 DIAGNOSIS — Z8659 Personal history of other mental and behavioral disorders: Secondary | ICD-10-CM

## 2022-07-02 DIAGNOSIS — Z8719 Personal history of other diseases of the digestive system: Secondary | ICD-10-CM

## 2022-07-02 DIAGNOSIS — F5101 Primary insomnia: Secondary | ICD-10-CM

## 2022-07-02 DIAGNOSIS — Z8639 Personal history of other endocrine, nutritional and metabolic disease: Secondary | ICD-10-CM

## 2022-07-02 DIAGNOSIS — M7061 Trochanteric bursitis, right hip: Secondary | ICD-10-CM

## 2022-07-02 DIAGNOSIS — Z8679 Personal history of other diseases of the circulatory system: Secondary | ICD-10-CM

## 2022-07-02 DIAGNOSIS — M1712 Unilateral primary osteoarthritis, left knee: Secondary | ICD-10-CM

## 2022-07-02 NOTE — Patient Instructions (Signed)
Standing Labs We placed an order today for your standing lab work.   Please have your standing labs drawn in September and every 3 months  If possible, please have your labs drawn 2 weeks prior to your appointment so that the provider can discuss your results at your appointment.  Please note that you may see your imaging and lab results in MyChart before we have reviewed them. We may be awaiting multiple results to interpret others before contacting you. Please allow our office up to 72 hours to thoroughly review all of the results before contacting the office for clarification of your results.  We have open lab daily: Monday through Thursday from 1:30-4:30 PM and Friday from 1:30-4:00 PM at the office of Dr. Rastus Borton, [facility].   Please be advised, all patients with office appointments requiring lab work will take precedent over walk-in lab work.  If possible, please come for your lab work on Monday and Friday afternoons, as you may experience shorter wait times. The office is located at [address], [address], [place], [place] [address] No appointment is necessary.   Labs are drawn by [facility]. Please bring your co-pay at the time of your lab draw.  You may receive a bill from [facility] for your lab work.  Please note if you are on Hydroxychloroquine and and an order has been placed for a Hydroxychloroquine level, you will need to have it drawn 4 hours or more after your last dose.  If you wish to have your labs drawn at another location, please call the office 24 hours in advance to send orders.  If you have any questions regarding directions or hours of operation,  please call [phone].   As a reminder, please drink plenty of water prior to coming for your lab work. Thanks!   Vaccines You are taking a medication(s) that can suppress your immune system.  The following immunizations are recommended: Flu annually Covid-19  Td/Tdap (tetanus, diphtheria,  pertussis) every 10 years Pneumonia (Prevnar 15 then Pneumovax 23 at least 1 year apart.  Alternatively, can take Prevnar 20 without needing additional dose) Shingrix: 2 doses from 4 weeks to 6 months apart  Please check with your PCP to make sure you are up to date.   If you have signs or symptoms of an infection or start antibiotics: First, call your PCP for workup of your infection. Hold your medication through the infection, until you complete your antibiotics, and until symptoms resolve if you take the following: Injectable medication (Actemra, Benlysta, Cimzia, Cosentyx, Enbrel, Humira, Kevzara, Orencia, Remicade, Simponi, Stelara, Taltz, Tremfya) Methotrexate Leflunomide (Arava) Mycophenolate (Cellcept) Xeljanz, Olumiant, or Rinvoq  

## 2022-07-14 ENCOUNTER — Other Ambulatory Visit: Payer: Self-pay | Admitting: Rheumatology

## 2022-07-25 ENCOUNTER — Other Ambulatory Visit: Payer: Self-pay | Admitting: Rheumatology

## 2022-07-25 NOTE — Telephone Encounter (Signed)
Next Visit: 12/02/2022  Last Visit: 07/02/2022  Last Fill:   DX: Other systemic lupus erythematosus with other organ involvement   Current Dose per office note 07/02/2022: MTX 0.3 ml sq once weekly   Labs: 06/06/2022 CBC and CMP are normal.  Okay to refill MTX?

## 2022-07-25 NOTE — Telephone Encounter (Unsigned)
[facility] left a voicemail requesting prescription refill of Methotrexate for the patient.

## 2022-07-27 MED ORDER — METHOTREXATE SODIUM CHEMO INJECTION 50 MG/2ML
7.5000 mg | INTRAMUSCULAR | 0 refills | Status: DC
Start: 2022-07-27 — End: 2022-10-03

## 2022-07-31 ENCOUNTER — Other Ambulatory Visit: Payer: Self-pay | Admitting: Rheumatology

## 2022-08-07 ENCOUNTER — Other Ambulatory Visit: Payer: Self-pay | Admitting: Rheumatology

## 2022-08-07 ENCOUNTER — Ambulatory Visit
Admission: RE | Admit: 2022-08-07 | Discharge: 2022-08-07 | Disposition: A | Discharge status: Home / Self Care | Payer: Medicare Other | Source: Ambulatory Visit | Attending: Obstetrics and Gynecology | Admitting: Obstetrics and Gynecology

## 2022-08-07 DIAGNOSIS — Z1231 Encounter for screening mammogram for malignant neoplasm of breast: Secondary | ICD-10-CM

## 2022-08-11 ENCOUNTER — Other Ambulatory Visit: Payer: Self-pay | Admitting: Obstetrics and Gynecology

## 2022-08-11 DIAGNOSIS — R928 Other abnormal and inconclusive findings on diagnostic imaging of breast: Secondary | ICD-10-CM

## 2022-08-13 ENCOUNTER — Other Ambulatory Visit: Payer: Self-pay | Admitting: Rheumatology

## 2022-08-14 ENCOUNTER — Other Ambulatory Visit: Payer: Self-pay | Admitting: Rheumatology

## 2022-08-21 ENCOUNTER — Other Ambulatory Visit: Payer: Self-pay | Admitting: Obstetrics and Gynecology

## 2022-08-21 ENCOUNTER — Ambulatory Visit
Admission: RE | Admit: 2022-08-21 | Discharge: 2022-08-21 | Disposition: A | Discharge status: Home / Self Care | Payer: Medicare Other | Source: Ambulatory Visit | Attending: Obstetrics and Gynecology | Admitting: Obstetrics and Gynecology

## 2022-08-21 DIAGNOSIS — R928 Other abnormal and inconclusive findings on diagnostic imaging of breast: Secondary | ICD-10-CM

## 2022-08-21 DIAGNOSIS — N6320 Unspecified lump in the left breast, unspecified quadrant: Secondary | ICD-10-CM

## 2022-08-27 ENCOUNTER — Ambulatory Visit
Admission: RE | Admit: 2022-08-27 | Discharge: 2022-08-27 | Disposition: A | Discharge status: Home / Self Care | Payer: Medicare Other | Source: Ambulatory Visit | Attending: Obstetrics and Gynecology | Admitting: Obstetrics and Gynecology

## 2022-08-27 DIAGNOSIS — N6320 Unspecified lump in the left breast, unspecified quadrant: Secondary | ICD-10-CM

## 2022-09-17 ENCOUNTER — Other Ambulatory Visit: Payer: Self-pay | Admitting: Physician Assistant

## 2022-09-17 NOTE — Telephone Encounter (Signed)
Next Visit: 12/02/2022  Last Visit: 07/02/2022  Last Fill: 06/13/2022  Dx: Other systemic lupus erythematosus with other organ involvement   Current Dose per office note on 2/89/0228: folic acid 1 mg po daily  Okay to refill folic acid?

## 2022-10-03 ENCOUNTER — Other Ambulatory Visit: Payer: Self-pay | Admitting: *Deleted

## 2022-10-03 MED ORDER — METHOTREXATE SODIUM CHEMO INJECTION 50 MG/2ML: 7.5000 mg | INTRAMUSCULAR | 0 refills | Status: DC

## 2022-10-03 NOTE — Telephone Encounter (Signed)
Deborah Levy with [facility] pharmacy left message requesting a refill on MTX for patient.  Next Visit: 12/02/2022  Last Visit: 07/02/2022  Last Fill: 07/27/2022  DX: Other systemic lupus erythematosus with other organ involvement   Current Dose per office note 07/02/2022: MTX 0.3 ml sq once weekly   Labs: 09/22/2022 CO2, Glucose 106  Okay to refill MTX?

## 2022-10-15 ENCOUNTER — Other Ambulatory Visit: Payer: Self-pay | Admitting: *Deleted

## 2022-10-15 DIAGNOSIS — R5383 Other fatigue: Secondary | ICD-10-CM

## 2022-10-15 DIAGNOSIS — M3219 Other organ or system involvement in systemic lupus erythematosus: Secondary | ICD-10-CM

## 2022-10-15 DIAGNOSIS — M1712 Unilateral primary osteoarthritis, left knee: Secondary | ICD-10-CM

## 2022-10-15 DIAGNOSIS — Z79899 Other long term (current) drug therapy: Secondary | ICD-10-CM

## 2022-10-16 ENCOUNTER — Telehealth: Payer: Self-pay

## 2022-10-16 NOTE — Telephone Encounter (Signed)
Patient called and states her pharmacy is now [facility] on [address]. She is no longer using [facility] pharmacy. I have removed [facility] from the pharmacy list.

## 2022-10-16 NOTE — Telephone Encounter (Signed)
Noted  

## 2022-10-17 LAB — CBC WITH DIFFERENTIAL/PLATELET
Absolute Monocytes: 451 cells/uL (ref 200–950)
Basophils Absolute: 22 cells/uL (ref 0–200)
Basophils Relative: 0.4 %
Eosinophils Absolute: 132 cells/uL (ref 15–500)
Eosinophils Relative: 2.4 %
HCT: 36.4 % (ref 35.0–45.0)
Hemoglobin: 12.4 g/dL (ref 11.7–15.5)
Lymphs Abs: 1199 cells/uL (ref 850–3900)
MCH: 30.2 pg (ref 27.0–33.0)
MCHC: 34.1 g/dL (ref 32.0–36.0)
MCV: 88.8 fL (ref 80.0–100.0)
MPV: 11 fL (ref 7.5–12.5)
Monocytes Relative: 8.2 %
Neutro Abs: 3696 cells/uL (ref 1500–7800)
Neutrophils Relative %: 67.2 %
Platelets: 263 10*3/uL (ref 140–400)
RBC: 4.1 10*6/uL (ref 3.80–5.10)
RDW: 13.4 % (ref 11.0–15.0)
Total Lymphocyte: 21.8 %
WBC: 5.5 10*3/uL (ref 3.8–10.8)

## 2022-10-17 LAB — PROTEIN / CREATININE RATIO, URINE
Creatinine, Urine: 146 mg/dL (ref 20–275)
Protein/Creat Ratio: 151 mg/g creat (ref 24–184)
Protein/Creatinine Ratio: 0.151 mg/mg creat (ref 0.024–0.184)
Total Protein, Urine: 22 mg/dL (ref 5–24)

## 2022-10-17 LAB — C3 AND C4
C3 Complement: 167 mg/dL (ref 83–193)
C4 Complement: 32 mg/dL (ref 15–57)

## 2022-10-17 LAB — COMPLETE METABOLIC PANEL WITH GFR
AG Ratio: 1.5 (calc) (ref 1.0–2.5)
ALT: 18 U/L (ref 6–29)
AST: 25 U/L (ref 10–35)
Albumin: 4 g/dL (ref 3.6–5.1)
Alkaline phosphatase (APISO): 65 U/L (ref 37–153)
BUN: 22 mg/dL (ref 7–25)
CO2: 29 mmol/L (ref 20–32)
Calcium: 9.2 mg/dL (ref 8.6–10.4)
Chloride: 102 mmol/L (ref 98–110)
Creat: 0.98 mg/dL (ref 0.50–1.05)
Globulin: 2.7 g/dL (calc) (ref 1.9–3.7)
Glucose, Bld: 153 mg/dL — ABNORMAL HIGH (ref 65–99)
Potassium: 4.5 mmol/L (ref 3.5–5.3)
Sodium: 140 mmol/L (ref 135–146)
Total Bilirubin: 0.4 mg/dL (ref 0.2–1.2)
Total Protein: 6.7 g/dL (ref 6.1–8.1)
eGFR: 63 mL/min/{1.73_m2} (ref >=60)

## 2022-10-17 LAB — PROTEIN ELECTROPHORESIS, SERUM, WITH REFLEX
Albumin ELP: 3.7 g/dL — ABNORMAL LOW (ref 3.8–4.8)
Alpha 1: 0.3 g/dL (ref 0.2–0.3)
Alpha 2: 0.8 g/dL (ref 0.5–0.9)
Beta 2: 0.4 g/dL (ref 0.2–0.5)
Beta Globulin: 0.4 g/dL (ref 0.4–0.6)
Gamma Globulin: 1 g/dL (ref 0.8–1.7)
Total Protein: 6.7 g/dL (ref 6.1–8.1)

## 2022-10-17 LAB — RHEUMATOID FACTOR: Rheumatoid fact SerPl-aCnc: <14 IU/mL (ref <14)

## 2022-10-17 LAB — SEDIMENTATION RATE: Sed Rate: 17 mm/h (ref 0–30)

## 2022-10-17 LAB — ANTI-DNA ANTIBODY, DOUBLE-STRANDED: ds DNA Ab: <1 IU/mL

## 2022-10-17 NOTE — Progress Notes (Signed)
Glucose is elevated.  CBC normal, SPEP normal, urine protein negative, sed rate normal, double-stranded DNA and negative, RF negative, C3-C4 normal.  Labs do not indicate autoimmune disease flare.

## 2022-11-20 NOTE — Progress Notes (Signed)
Office Visit Note  Patient: Deborah Levy             Date of Birth: 1955-12-22           MRN: [id number]             PCP: Drosinis, Pamalee Leyden, PA-C Referring: Drosinis, Pamalee Leyden, PA-C Visit Date: 12/02/2022 Occupation: _0 @  Subjective:  Pain in both knees   History of Present Illness: Deborah Levy is a 66 y.o. female with history of systemic lupus erythematous, osteoarthritis, and fibromyalgia.  She remains on MTX 0.3 ml sq once weekly and folic acid 1 mg po daily.  She continues to tolerate methotrexate without any side effects and has not missed any doses recently.  She denies any signs or symptoms of a systemic lupus flare.  She has chronic sicca symptoms but denies any sores in her mouth or nose.  She has not had any recent rashes or Raynaud's phenomenon.  She continues to have ongoing hair loss which has slightly worsened.  She has not yet followed up with left and dermatology.  She has been experiencing increased pain in both hands and both knee joints.  She states that her knee joint pain has been constant.  She uses Voltaren gel topically as needed for pain relief.  She has had cortisone injections in the past which provided temporary relief.  She has not yet had Visco gel injections performed.  She continues to follow-up with pain management on a regular basis.  She has ongoing pain in her lower back.  She remains on Lyrica, Belbuca, tramadol, and Celebrex for pain relief.   Activities of Daily Living:  Patient reports morning stiffness for 1.5-2 hours.  Patient Denies nocturnal pain.  Difficulty dressing/grooming: Denies Difficulty climbing stairs: Denies Difficulty getting out of chair: Denies Difficulty using hands for taps, buttons, cutlery, and/or writing: Denies  Review of Systems  Constitutional:  Positive for fatigue.  HENT:  Positive for mouth dryness. Negative for mouth sores and nose dryness.   Eyes:  Positive for dryness. Negative for pain and visual  disturbance.  Respiratory:  Negative for cough, hemoptysis, shortness of breath and difficulty breathing.   Cardiovascular:  Negative for chest pain, palpitations, hypertension and swelling in legs/feet.  Gastrointestinal:  Positive for constipation. Negative for blood in stool and diarrhea.  Endocrine: Positive for increased urination.  Genitourinary:  Negative for painful urination and involuntary urination.  Musculoskeletal:  Positive for joint pain, joint pain, joint swelling, myalgias, muscle weakness, morning stiffness, muscle tenderness and myalgias. Negative for gait problem.  Skin:  Positive for hair loss. Negative for color change, pallor, rash, nodules/bumps, skin tightness, ulcers and sensitivity to sunlight.  Allergic/Immunologic: Negative.  Negative for susceptible to infections.  Neurological:  Positive for dizziness and headaches. Negative for numbness and weakness.  Hematological: Negative.  Negative for swollen glands.  Psychiatric/Behavioral:  Positive for depressed mood and sleep disturbance. The patient is nervous/anxious.     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   Type 2 diabetes mellitus ([place]) 11/22/2014   Chronic pain 11/13/2014   Fibromyalgia 11/13/2014   Rotator cuff impingement syndrome 11/13/2014  Cannot sleep 02/13/2014   Essential (primary) hypertension 09/07/2013   HLD (hyperlipidemia) 09/07/2013   Adiposity 09/07/2013   Systemic lupus erythematosus ([place]) 05/21/2013   Hypokalemia 04/21/2013   Breast cancer of lower-inner quadrant of left female breast ([place]) 01/12/2012    Past Medical History:  Diagnosis Date   Breast  cancer ([place])    Cancer ([place])    Fibromyalgia    GERD (gastroesophageal reflux disease)    Hypertension    Lupus ([place])     Family History  Problem Relation Age of Onset   Cancer Father        colon   Hypertension Father    Heart disease Father    Cancer Sister        Cervical   Cancer Brother        prostate    Heart disease Maternal Grandfather    Hypertension Paternal Grandmother    Heart disease Paternal Grandmother    Breast cancer Neg Hx    Past Surgical History:  Procedure Laterality Date   BREAST LUMPECTOMY Left 2012   BREAST SURGERY  03/10/2011   Lt br lumpectomy   COLONOSCOPY     Removed 2 polpys    KNEE SURGERY     SHOULDER SURGERY     TUBAL LIGATION     Social History   Social History Narrative   Not on file   Immunization History  Administered Date(s) Administered   PFIZER(Purple Top)SARS-COV-2 Vaccination 03/04/2020, 03/25/2020     Objective: Vital Signs: BP 111/70 (BP Location: Left Arm, Patient Position: Sitting, Cuff Size: Large)   Resp 16   Ht 5' (1.524 m)   Wt 171 lb 4.8 oz (77.7 kg)   BMI 33.45 kg/m    Physical Exam Vitals and nursing note reviewed.  Constitutional:      Appearance: She is well-developed.  HENT:     Head: Normocephalic and atraumatic.  Eyes:     Conjunctiva/sclera: Conjunctivae normal.  Cardiovascular:     Rate and Rhythm: Normal rate and regular rhythm.     Heart sounds: Normal heart sounds.  Pulmonary:     Effort: Pulmonary effort is normal.     Breath sounds: Normal breath sounds.  Abdominal:     General: Bowel sounds are normal.     Palpations: Abdomen is soft.  Musculoskeletal:     Cervical back: Normal range of motion.  Skin:    General: Skin is warm and dry.     Capillary Refill: Capillary refill takes less than 2 seconds.     Comments: No malar rash. No digital ulcers. Hair thinning on superior aspect of scalp.   Neurological:     Mental Status: She is alert and oriented to person, place, and time.   Psychiatric:        Behavior: Behavior normal.      Musculoskeletal Exam: C-spine has good range of motion.  Painful range of motion of the lumbar spine.  Shoulder joints, elbow joints, wrist joints, MCPs, PIPs, DIPs have good range of motion with no synovitis.  PIP and DIP thickening consistent with osteoarthritis of both hands.  Hip joints have good range of motion with no groin pain.  Tenderness over bilateral trochanteric bursa.  Knee joints have painful range of motion but no warmth or effusion.  Ankle joints have good range of motion with no tenderness or synovitis.  CDAI Exam: CDAI Score: -- Patient Global: --; Provider Global: -- Swollen: --; Tender: -- Joint Exam 12/02/2022  No joint exam has been documented for this visit   There is currently no information documented on the homunculus. Go to the Rheumatology activity and complete the homunculus joint exam.  Investigation: No additional findings.  Imaging: No results found.  Recent Labs: Lab Results  Component Value Date   WBC 5.5 10/15/2022   HGB 12.4 10/15/2022   PLT 263 10/15/2022   NA 140 10/15/2022   K 4.5 10/15/2022   CL 102 10/15/2022   CO2 29 10/15/2022   GLUCOSE 153 (H) 10/15/2022   BUN 22 10/15/2022   CREATININE 0.98 10/15/2022   BILITOT 0.4 10/15/2022   ALKPHOS 86 07/01/2017   AST 25 10/15/2022   ALT 18 10/15/2022   PROT 6.7 10/15/2022   PROT 6.7 10/15/2022   ALBUMIN 4.3 07/01/2017   CALCIUM 9.2 10/15/2022   GFRAA 99 02/26/2021    Speciality Comments: No specialty comments available.  Procedures:  No procedures performed Allergies: Sulfa antibiotics, Escitalopram, Metformin, and Sulfasalazine   Assessment / Plan:     Visit Diagnoses: Other systemic lupus erythematosus with other organ involvement (HCC) - Fatigue, arthralgia, malar rash, photosensitivity, positive subcutaneous lupus on biopsy, Positive ANA, Ro+ and La +: She has not had any signs or symptoms of a systemic lupus flare.  She  has clinically been doing well taking methotrexate 0.3 mL sq injections once weekly and folic acid 1 mg daily.  She continues to tolerate methotrexate without any side effects and has not missed any doses recently.  She has been experiencing increased pain and stiffness in both hands and both knee joints due to underlying osteoarthritis.  No synovitis or joint effusion was noted on examination today.  She remains under the care of pain management.  At her follow-up visit she would like to have updated x-rays of both knees in order for [place] to proceed with applying for Visco gel injections for both knees since she has had inadequate response to cortisone in the past. She has not had any recurrence of cutaneous lupus.  No symptoms of Raynaud's phenomenon recently.  She has had increased hair loss on the superior aspect of her scalp.  She was advised to follow-up at [facility] dermatology for further evaluation.  She continues to have chronic sicca symptoms and occasional oral or oral ulcers.  She has not had any symptoms of Raynaud's phenomenon recently. Lab work from 10/15/2022 was reviewed today in the office: CBC within normal limits, glucose 153-rest of CMP within normal limits, protein creatinine ratio within normal limits, double-stranded DNA negative, complements within normal limits, ESR within normal limits, RF negative, SPEP did not reveal any abnormal protein bands.  Labs were not consistent with active disease or a flare.  She will remain on methotrexate as prescribed.  She was advised to notify us if she develops signs or symptoms of a flare.  She will follow-up in the office in 2 months or sooner if needed.  High risk medication use - MTX 0.3 ml sq once weekly and folic acid 1 mg po daily. Reduced dose of MTX due to elevated LFTs.  Lab work from 10/15/2022 was reviewed today in the office: Glucose 153, rest of CMP WNL. CBC WNL.  Her next lab work will be due in February and every 3 months to monitor for drug  toxicity. She has not had any recent or recurrent infections.  Discussed the importance of holding methotrexate if she develops signs or symptoms of an infection and to resume once the infection is  completely cleared.  Cutaneous lupus erythematosus: Followed by [facility] dermatology.  No recent flare.   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  Has been constant no mechanical symptoms at this time.  She takes Celebrex, tramadol, and Belbuca for pain management.  She has had cortisone injections performed in the past at pain management which have provided temporary relief.  Different treatment options were discussed today.  She would like to proceed with Visco gel injections in 2024.  Discussed that she will require updated x-rays of both knees prior to [place] applying for these injections.  She would like to further discuss Visco gel injections as well as have updated x-rays at her follow-up visit in 2 months.  She plans on using Voltaren gel topically as needed for pain relief until then.  Primary osteoarthritis of left knee: She presents today with increased pain in the left knee joint.  Patient declined updated x-rays of the left knee today.  Discussed applying for Visco gel injections in the future but she would like to hold off until having updated x-rays at her follow-up visit 2 months.  Fibromyalgia: Followed by pain management.  She remains on Lyrica, Celebrex, Belbuca, and tramadol for pain relief.  Discussed the importance of regular exercise and good sleep hygiene.  Trochanteric bursitis of both hips: She has ongoing tenderness to palpation over bilateral trochanteric bursa.  Discussed the importance of performing stretching exercises daily.  Primary insomnia -She is taking melatonin at bedtime for insomnia.  Other fatigue: Chronic, stable.  Discussed the importance of regular  exercise.  Osteoporosis screening -DEXA ordered by PCP.  Closed fracture of base of fifth metatarsal bone of left foot - X-rays on 01/17/22.  Vitamin D deficiency  Other medical conditions are listed as follows:  History of hypertension - BP was 111/70 today in the office.  History of gastroesophageal reflux (GERD)  History of hyperlipidemia  History of diabetes mellitus  History of depression  History of breast cancer  Orders: No orders of the defined types were placed in this encounter.  No orders of the defined types were placed in this encounter.    Follow-Up Instructions: Return in about 2 months (around 02/02/2023) for Systemic lupus erythematosus, Osteoarthritis, Fibromyalgia.   Ofilia Neas, PA-C  Note - This record has been created using Dragon software.  Chart creation errors have been sought, but may not always  have been located. Such creation errors do not reflect on  the standard of medical care.

## 2022-12-02 ENCOUNTER — Ambulatory Visit: Payer: Medicare Other | Attending: Physician Assistant | Admitting: Physician Assistant

## 2022-12-02 ENCOUNTER — Encounter: Payer: Self-pay | Admitting: Physician Assistant

## 2022-12-02 VITALS — BP 111/70 | Resp 16 | Ht 60.0 in | Wt 171.3 lb

## 2022-12-02 DIAGNOSIS — Z8659 Personal history of other mental and behavioral disorders: Secondary | ICD-10-CM

## 2022-12-02 DIAGNOSIS — Z13820 Encounter for screening for osteoporosis: Secondary | ICD-10-CM

## 2022-12-02 DIAGNOSIS — M7061 Trochanteric bursitis, right hip: Secondary | ICD-10-CM

## 2022-12-02 DIAGNOSIS — M797 Fibromyalgia: Secondary | ICD-10-CM

## 2022-12-02 DIAGNOSIS — S92352A Displaced fracture of fifth metatarsal bone, left foot, initial encounter for closed fracture: Secondary | ICD-10-CM

## 2022-12-02 DIAGNOSIS — G8929 Other chronic pain: Secondary | ICD-10-CM

## 2022-12-02 DIAGNOSIS — Z8719 Personal history of other diseases of the digestive system: Secondary | ICD-10-CM

## 2022-12-02 DIAGNOSIS — R5383 Other fatigue: Secondary | ICD-10-CM

## 2022-12-02 DIAGNOSIS — M7062 Trochanteric bursitis, left hip: Secondary | ICD-10-CM

## 2022-12-02 DIAGNOSIS — Z8679 Personal history of other diseases of the circulatory system: Secondary | ICD-10-CM

## 2022-12-02 DIAGNOSIS — L932 Other local lupus erythematosus: Secondary | ICD-10-CM

## 2022-12-02 DIAGNOSIS — Z79899 Other long term (current) drug therapy: Secondary | ICD-10-CM

## 2022-12-02 DIAGNOSIS — M3219 Other organ or system involvement in systemic lupus erythematosus: Secondary | ICD-10-CM | POA: Diagnosis not present

## 2022-12-02 DIAGNOSIS — H35389 Toxic maculopathy, unspecified eye: Secondary | ICD-10-CM | POA: Diagnosis not present

## 2022-12-02 DIAGNOSIS — M25562 Pain in left knee: Secondary | ICD-10-CM

## 2022-12-02 DIAGNOSIS — M25561 Pain in right knee: Secondary | ICD-10-CM

## 2022-12-02 DIAGNOSIS — Z853 Personal history of malignant neoplasm of breast: Secondary | ICD-10-CM

## 2022-12-02 DIAGNOSIS — Z8639 Personal history of other endocrine, nutritional and metabolic disease: Secondary | ICD-10-CM

## 2022-12-02 DIAGNOSIS — M1712 Unilateral primary osteoarthritis, left knee: Secondary | ICD-10-CM

## 2022-12-02 DIAGNOSIS — E559 Vitamin D deficiency, unspecified: Secondary | ICD-10-CM

## 2022-12-02 DIAGNOSIS — F5101 Primary insomnia: Secondary | ICD-10-CM

## 2022-12-02 DIAGNOSIS — T372X5A Adverse effect of antimalarials and drugs acting on other blood protozoa, initial encounter: Secondary | ICD-10-CM

## 2022-12-02 NOTE — Patient Instructions (Signed)
Standing Labs We placed an order today for your standing lab work.   Please have your standing labs drawn in February and every 3 months   Please have your labs drawn 2 weeks prior to your appointment so that the provider can discuss your lab results at your appointment.  Please note that you may see your imaging and lab results in MyChart before we have reviewed them. We will contact you once all results are reviewed. Please allow our office up to 72 hours to thoroughly review all of the results before contacting the office for clarification of your results.  Lab hours are:   Monday through Thursday from 8:00 am -12:30 pm and 1:00 pm-5:00 pm and Friday from 8:00 am-12:00 pm.  Please be advised, all patients with office appointments requiring lab work will take precedent over walk-in lab work.   Labs are drawn by [facility]. Please bring your co-pay at the time of your lab draw.  You may receive a bill from [facility] for your lab work.  Please note if you are on Hydroxychloroquine and and an order has been placed for a Hydroxychloroquine level, you will need to have it drawn 4 hours or more after your last dose.  If you wish to have your labs drawn at another location, please call the office 24 hours in advance so we can fax the orders.  The office is located at [address], [address], [place], [place] [address] No appointment is necessary.    If you have any questions regarding directions or hours of operation,  please call [phone].   As a reminder, please drink plenty of water prior to coming for your lab work. Thanks!  

## 2023-01-22 NOTE — Progress Notes (Unsigned)
Office Visit Note  Patient: Deborah Levy             Date of Birth: Jul 12, 1956           MRN: [id number]             PCP: Drosinis, Pamalee Leyden, PA-C Referring: Drosinis, Pamalee Leyden, PA-C Visit Date: 02/04/2023 Occupation: @GUAROCC$ @  Subjective:  Fatigue   History of Present Illness: Deborah Levy is a 67 y.o. female with history of systemic lupus, cutaneous lupus, and osteoarthritis.  Patient remains on MTX 0.3 ml sq once weekly and folic acid 1 mg po daily. Reduced dose of MTX due to elevated LFTs.  Patient continues to take Zofran as needed for nausea after taking her weekly methotrexate dose.  Overall she has tolerated methotrexate otherwise.  She denies any signs or symptoms of a systemic lupus flare.  She has not had any cutaneous lupus lesions.  No Maller rash.  She denies any sores in her mouth or nose.  She has not had any symptoms of Raynaud's phenomenon.  She denies any swollen lymph nodes.  She has not had any new cough or shortness of breath.  She denies any recurrent infections. Patient reports that her biggest concern has been her level of fatigue.  She continues to have difficulty sleeping at night so has been sleeping more frequently throughout the day.  She does not wake up feeling restored.  She has been taking melatonin 30 mg at bedtime.  She has not had a sleep study.   She continues to follow-up at pain management every 3 months and has an upcoming appointment scheduled on 03/02/2023.  She does not feel that her pain level has been adequately managed despite taking Lyrica and Belbuca as prescribed.  She has been having ongoing pain in both knee joints especially her right knee.  She plans on proceeding with Visco gel injections for both knees through pain management.      Activities of Daily Living:  Patient reports morning stiffness for 2 hours.   Patient Reports nocturnal pain.  Difficulty dressing/grooming: Denies Difficulty climbing stairs: Reports Difficulty  getting out of chair: Denies Difficulty using hands for taps, buttons, cutlery, and/or writing: Denies  Review of Systems  Constitutional:  Positive for fatigue.  HENT:  Positive for mouth dryness. Negative for mouth sores.   Eyes:  Positive for dryness.  Respiratory: Negative.  Negative for shortness of breath.   Cardiovascular: Negative.  Negative for chest pain and palpitations.  Gastrointestinal:  Positive for constipation. Negative for blood in stool and diarrhea.  Endocrine: Negative for increased urination.  Genitourinary: Negative.  Negative for involuntary urination.  Musculoskeletal:  Positive for joint pain, gait problem, joint pain, myalgias, muscle weakness, morning stiffness, muscle tenderness and myalgias. Negative for joint swelling.  Skin: Negative.  Negative for color change, rash, hair loss and sensitivity to sunlight.  Allergic/Immunologic: Negative.  Negative for susceptible to infections.  Neurological:  Positive for dizziness and headaches.  Hematological: Negative.  Negative for swollen glands.  Psychiatric/Behavioral:  Positive for depressed mood and sleep disturbance. The patient is nervous/anxious.     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   Type 2 diabetes mellitus ([place]) 11/22/2014   Chronic pain 11/13/2014   Fibromyalgia 11/13/2014   Rotator cuff impingement syndrome 11/13/2014   Cannot sleep 02/13/2014   Essential (primary) hypertension 09/07/2013   HLD (hyperlipidemia) 09/07/2013   Adiposity 09/07/2013   Systemic lupus erythematosus ([place]) 05/21/2013   Hypokalemia 04/21/2013   Breast cancer of lower-inner quadrant of left  female breast ([place]) 01/12/2012    Past Medical History:  Diagnosis Date   Breast cancer ([place])    Cancer ([place])    Fibromyalgia    GERD (gastroesophageal reflux disease)    Hypertension    Lupus ([place])     Family History  Problem Relation Age of Onset   Cancer Father        colon   Hypertension Father    Heart disease Father    Cancer Sister        Cervical   Cancer Brother        prostate    Heart disease Maternal Grandfather    Hypertension Paternal Grandmother    Heart disease Paternal Grandmother    Breast cancer Neg Hx    Past Surgical History:  Procedure Laterality Date   BREAST LUMPECTOMY Left 2012   BREAST SURGERY  03/10/2011   Lt br lumpectomy   CATARACT EXTRACTION     08/2022 and 09/2022   COLONOSCOPY     Removed 2 polpys    KNEE SURGERY     SHOULDER SURGERY     TUBAL LIGATION     Social History   Social History Narrative   Not on file   Immunization History  Administered Date(s) Administered   PFIZER(Purple Top)SARS-COV-2 Vaccination 03/04/2020, 03/25/2020     Objective: Vital Signs: BP 105/69 (BP Location: Left Arm, Patient Position: Sitting, Cuff Size: Large)   Pulse 65   Resp 16   Ht 5' (1.524 m)   Wt 163 lb 3.2 oz (74 kg)   BMI 31.87 kg/m    Physical Exam Vitals and nursing note reviewed.  Constitutional:      Appearance: She is well-developed.  HENT:     Head: Normocephalic and atraumatic.  Eyes:     Conjunctiva/sclera: Conjunctivae normal.  Cardiovascular:     Rate and Rhythm: Normal rate and regular rhythm.     Heart sounds: Normal heart sounds.  Pulmonary:     Effort: Pulmonary effort is normal.     Breath sounds: Normal breath sounds.  Abdominal:     General: Bowel sounds are normal.     Palpations: Abdomen is soft.  Musculoskeletal:     Cervical back: Normal range of motion.  Skin:    General: Skin is warm and dry.     Capillary Refill: Capillary refill takes less than 2 seconds.  Neurological:     Mental Status: She is  alert and oriented to person, place, and time.  Psychiatric:        Behavior: Behavior normal.      Musculoskeletal Exam: C-spine has good range of motion.  Discomfort with range of motion of the lumbar spine.  Shoulder joints, elbow joints, wrist joints, MCPs, PIPs, DIPs have good range of motion with no synovitis.  PIP and DIP thickening noted.  Hip joints have good range of motion with no groin pain.  Painful range of motion of both knee joints especially the right knee.  No warmth or effusion noted in the knee joints.  Ankle joints have good range of motion with  no tenderness or synovitis.  CDAI Exam: CDAI Score: -- Patient Global: --; Provider Global: -- Swollen: --; Tender: -- Joint Exam 02/04/2023   No joint exam has been documented for this visit   There is currently no information documented on the homunculus. Go to the Rheumatology activity and complete the homunculus joint exam.  Investigation: No additional findings.  Imaging: No results found.  Recent Labs: Lab Results  Component Value Date   WBC 5.5 10/15/2022   HGB 12.4 10/15/2022   PLT 263 10/15/2022   NA 140 10/15/2022   K 4.5 10/15/2022   CL 102 10/15/2022   CO2 29 10/15/2022   GLUCOSE 153 (H) 10/15/2022   BUN 22 10/15/2022   CREATININE 0.98 10/15/2022   BILITOT 0.4 10/15/2022   ALKPHOS 86 07/01/2017   AST 25 10/15/2022   ALT 18 10/15/2022   PROT 6.7 10/15/2022   PROT 6.7 10/15/2022   ALBUMIN 4.3 07/01/2017   CALCIUM 9.2 10/15/2022   GFRAA 99 02/26/2021    Speciality Comments: No specialty comments available.  Procedures:  No procedures performed Allergies: Sulfa antibiotics, Escitalopram, Metformin, and Sulfasalazine      Assessment / Plan:     Visit Diagnoses: Other systemic lupus erythematosus with other organ involvement (HCC) - Fatigue, arthralgia, malar rash, photosensitivity, positive subcutaneous lupus on biopsy, Positive ANA, Ro+ and La +: She has not had any signs or symptoms of a  systemic lupus flare.  She has clinically been doing well on methotrexate 0.3 mg subcu injections once weekly and folic acid 1 mg daily.  She has not had any recurrence of cutaneous lupus.  Discussed the importance of avoiding direct sun exposure as well as wearing sunscreen SPF 50 on a daily basis.  She will notify us if she develops any symptoms of a cutaneous lupus rash.  No Malar rash was noted on examination today.  She has not had any symptoms of Raynaud's phenomenon.  No oral or nasal ulcerations. Lab work from 10/15/22 was reviewed today in the office: dsDNA negative, complements WNL, protein creatinine ratio WNL, ESR WNL.  The following lab work will be obtained today for further evaluation.  She will remain on methotrexate and folic acid as prescribed.  She was advised to notify us if she develops signs or symptoms of a lupus flare.  She will follow-up in the office in 3 months or sooner if needed. - Plan: CBC with Differential/Platelet, COMPLETE METABOLIC PANEL WITH GFR, Protein / creatinine ratio, urine, Anti-DNA antibody, double-stranded, C3 and C4, Sedimentation rate, ANA  High risk medication use - MTX 0.3 ml sq once weekly and folic acid 1 mg po daily. Reduced dose of MTX due to elevated LFTs. CBC and CMP updated on 10/15/22.  Orders for CBC and CMP released today.   - Plan: CBC with Differential/Platelet, COMPLETE METABOLIC PANEL WITH GFR No recent or recurrent infections.  Discussed the importance of holding methotrexate if she develops signs or symptoms of an infection and to resume once the infection has completely cleared.  Cutaneous lupus erythematosus - Followed by [facility] dermatology.  No recurrence.  Discussed the importance of avoiding direct sun exposure and to wear sunscreen SPF greater than 50 on a daily basis.  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  She is  not having difficulty exercising due to the discomfort and stiffness.  She plans on proceeding with Visco  gel injections in both knees through pain management.  Patient declined having updated x-rays of her knee joints today.  Fibromyalgia - Followed by pain management.  She remains on Lyrica, Celebrex, Belbuca, and tramadol for pain relief.  She continues to have generalized myalgias and muscle tenderness due to fibromyalgia.  She has generalized hyperalgesia on examination.  Her biggest concern has been her level of fatigue secondary to insomnia.  Discussed that she may benefit from proceeding with a sleep study for further evaluation.  She has significant daytime drowsiness to the point that she is having to sleep throughout the day. It is unclear why she is no longer taking Cymbalta.  She may benefit from the addition of Cymbalta in the future.  She plans on further discussing with her pain management specialist.  Trochanteric bursitis of both hips: Manageable.  No tenderness upon palpation today.  Primary insomnia: She has been having significant difficulty sleeping at night.  She has been taking melatonin 30 mg at bedtime but continues to have difficulty falling asleep and staying asleep.  Recommend proceeding with a sleep study.  She plans on following up with her PCP to further discuss.  Other fatigue: Chronic and secondary to insomnia.  Difficulty exercising due to the level of fatigue she experiences.  Recommend proceeding with a sleep study for further evaluation.  Other medical conditions are listed as follows:   Closed fracture of base of fifth metatarsal bone of left foot  Vitamin D deficiency  History of hypertension: Blood pressure was 105/69 today in the office.  History of hyperlipidemia  History of diabetes mellitus  History of gastroesophageal reflux (GERD)  History of depression  History of breast cancer  Orders: Orders Placed This Encounter  Procedures   CBC with  Differential/Platelet   COMPLETE METABOLIC PANEL WITH GFR   Protein / creatinine ratio, urine   Anti-DNA antibody, double-stranded   C3 and C4   Sedimentation rate   ANA   No orders of the defined types were placed in this encounter.    Follow-Up Instructions: Return in about 3 months (around 05/05/2023).   Ofilia Neas, PA-C  Note - This record has been created using Dragon software.  Chart creation errors have been sought, but may not always  have been located. Such creation errors do not reflect on  the standard of medical care.

## 2023-02-04 ENCOUNTER — Encounter: Payer: Self-pay | Admitting: Physician Assistant

## 2023-02-04 ENCOUNTER — Ambulatory Visit: Payer: Medicare Other | Attending: Physician Assistant | Admitting: Physician Assistant

## 2023-02-04 VITALS — BP 105/69 | HR 65 | Resp 16 | Ht 60.0 in | Wt 163.2 lb

## 2023-02-04 DIAGNOSIS — M7062 Trochanteric bursitis, left hip: Secondary | ICD-10-CM

## 2023-02-04 DIAGNOSIS — L932 Other local lupus erythematosus: Secondary | ICD-10-CM

## 2023-02-04 DIAGNOSIS — E559 Vitamin D deficiency, unspecified: Secondary | ICD-10-CM

## 2023-02-04 DIAGNOSIS — F5101 Primary insomnia: Secondary | ICD-10-CM

## 2023-02-04 DIAGNOSIS — Z79899 Other long term (current) drug therapy: Secondary | ICD-10-CM | POA: Diagnosis not present

## 2023-02-04 DIAGNOSIS — H35389 Toxic maculopathy, unspecified eye: Secondary | ICD-10-CM | POA: Diagnosis not present

## 2023-02-04 DIAGNOSIS — R5383 Other fatigue: Secondary | ICD-10-CM

## 2023-02-04 DIAGNOSIS — M1712 Unilateral primary osteoarthritis, left knee: Secondary | ICD-10-CM

## 2023-02-04 DIAGNOSIS — Z8719 Personal history of other diseases of the digestive system: Secondary | ICD-10-CM

## 2023-02-04 DIAGNOSIS — Z8659 Personal history of other mental and behavioral disorders: Secondary | ICD-10-CM

## 2023-02-04 DIAGNOSIS — T372X5A Adverse effect of antimalarials and drugs acting on other blood protozoa, initial encounter: Secondary | ICD-10-CM

## 2023-02-04 DIAGNOSIS — M3219 Other organ or system involvement in systemic lupus erythematosus: Secondary | ICD-10-CM | POA: Diagnosis not present

## 2023-02-04 DIAGNOSIS — M7061 Trochanteric bursitis, right hip: Secondary | ICD-10-CM

## 2023-02-04 DIAGNOSIS — Z853 Personal history of malignant neoplasm of breast: Secondary | ICD-10-CM

## 2023-02-04 DIAGNOSIS — M797 Fibromyalgia: Secondary | ICD-10-CM

## 2023-02-04 DIAGNOSIS — Z8679 Personal history of other diseases of the circulatory system: Secondary | ICD-10-CM

## 2023-02-04 DIAGNOSIS — Z8639 Personal history of other endocrine, nutritional and metabolic disease: Secondary | ICD-10-CM

## 2023-02-04 DIAGNOSIS — S92352A Displaced fracture of fifth metatarsal bone, left foot, initial encounter for closed fracture: Secondary | ICD-10-CM

## 2023-02-06 LAB — PROTEIN / CREATININE RATIO, URINE
Creatinine, Urine: 158 mg/dL (ref 20–275)
Protein/Creat Ratio: 139 mg/g creat (ref 24–184)
Protein/Creatinine Ratio: 0.139 mg/mg creat (ref 0.024–0.184)
Total Protein, Urine: 22 mg/dL (ref 5–24)

## 2023-02-06 LAB — CBC WITH DIFFERENTIAL/PLATELET
Absolute Monocytes: 354 cells/uL (ref 200–950)
Basophils Absolute: 21 cells/uL (ref 0–200)
Basophils Relative: 0.6 %
Eosinophils Absolute: 130 cells/uL (ref 15–500)
Eosinophils Relative: 3.7 %
HCT: 36.5 % (ref 35.0–45.0)
Hemoglobin: 12.6 g/dL (ref 11.7–15.5)
Lymphs Abs: 1369 cells/uL (ref 850–3900)
MCH: 30.3 pg (ref 27.0–33.0)
MCHC: 34.5 g/dL (ref 32.0–36.0)
MCV: 87.7 fL (ref 80.0–100.0)
MPV: 11 fL (ref 7.5–12.5)
Monocytes Relative: 10.1 %
Neutro Abs: 1628 cells/uL (ref 1500–7800)
Neutrophils Relative %: 46.5 %
Platelets: 224 10*3/uL (ref 140–400)
RBC: 4.16 10*6/uL (ref 3.80–5.10)
RDW: 14 % (ref 11.0–15.0)
Total Lymphocyte: 39.1 %
WBC: 3.5 10*3/uL — ABNORMAL LOW (ref 3.8–10.8)

## 2023-02-06 LAB — ANTI-NUCLEAR AB-TITER (ANA TITER)
ANA TITER: 1:80 {titer} — ABNORMAL HIGH
ANA Titer 1: 1:320 {titer} — ABNORMAL HIGH

## 2023-02-06 LAB — COMPLETE METABOLIC PANEL WITH GFR
AG Ratio: 1.3 (calc) (ref 1.0–2.5)
ALT: 25 U/L (ref 6–29)
AST: 30 U/L (ref 10–35)
Albumin: 4 g/dL (ref 3.6–5.1)
Alkaline phosphatase (APISO): 66 U/L (ref 37–153)
BUN/Creatinine Ratio: 20 (calc) (ref 6–22)
BUN: 21 mg/dL (ref 7–25)
CO2: 33 mmol/L — ABNORMAL HIGH (ref 20–32)
Calcium: 9.3 mg/dL (ref 8.6–10.4)
Chloride: 101 mmol/L (ref 98–110)
Creat: 1.07 mg/dL — ABNORMAL HIGH (ref 0.50–1.05)
Globulin: 3.1 g/dL (calc) (ref 1.9–3.7)
Glucose, Bld: 105 mg/dL — ABNORMAL HIGH (ref 65–99)
Potassium: 4.3 mmol/L (ref 3.5–5.3)
Sodium: 140 mmol/L (ref 135–146)
Total Bilirubin: 0.4 mg/dL (ref 0.2–1.2)
Total Protein: 7.1 g/dL (ref 6.1–8.1)
eGFR: 57 mL/min/{1.73_m2} — ABNORMAL LOW (ref >=60)

## 2023-02-06 LAB — ANTI-DNA ANTIBODY, DOUBLE-STRANDED: ds DNA Ab: <1 IU/mL

## 2023-02-06 LAB — ANA: Anti Nuclear Antibody (ANA): POSITIVE — AB

## 2023-02-06 LAB — C3 AND C4
C3 Complement: 150 mg/dL (ref 83–193)
C4 Complement: 26 mg/dL (ref 15–57)

## 2023-02-06 LAB — SEDIMENTATION RATE: Sed Rate: 19 mm/h (ref 0–30)

## 2023-02-09 NOTE — Progress Notes (Signed)
WBC count is borderline low-3.5. Rest of CBC WNL.  Creatinine is borderline elevated-1.05.  GFR is low-57.  Please clarify if she has been taking any NSAIDs? Rest of CMP WNL.  ESR WNL.  dsDNA is negative.  Complements WNL.  Protein creatinine ratio WNL.  ANA remains positive.  Labs are not consistent with a flare.

## 2023-04-28 NOTE — Progress Notes (Deleted)
Office Visit Note  Patient: Deborah Levy             Date of Birth: 1956-05-22           MRN: [id number]             PCP: Kerin Salen, PA-C Referring: Drosinis, Leonia Reader, PA-C Visit Date: 05/12/2023 Occupation: @GUAROCC @  Subjective:  No chief complaint on file.   History of Present Illness: Deborah Levy is a 67 y.o. female ***     Activities of Daily Living:  Patient reports morning stiffness for *** {minute/hour:19697}.   Patient {ACTIONS;DENIES/REPORTS:21021675::"Denies"} nocturnal pain.  Difficulty dressing/grooming: {ACTIONS;DENIES/REPORTS:21021675::"Denies"} Difficulty climbing stairs: {ACTIONS;DENIES/REPORTS:21021675::"Denies"} Difficulty getting out of chair: {ACTIONS;DENIES/REPORTS:21021675::"Denies"} Difficulty using hands for taps, buttons, cutlery, and/or writing: {ACTIONS;DENIES/REPORTS:21021675::"Denies"}  No Rheumatology ROS completed.   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   Type 2 diabetes mellitus (HCC) 11/22/2014   Chronic pain 11/13/2014   Fibromyalgia 11/13/2014   Rotator cuff impingement syndrome 11/13/2014   Cannot sleep 02/13/2014   Essential (primary) hypertension 09/07/2013   HLD (hyperlipidemia) 09/07/2013   Adiposity 09/07/2013   Systemic lupus erythematosus (HCC) 05/21/2013   Hypokalemia 04/21/2013   Breast cancer of lower-inner quadrant of left female breast (HCC) 01/12/2012    Past Medical History:  Diagnosis Date   Breast cancer (HCC)    Cancer (HCC)    Fibromyalgia    GERD (gastroesophageal reflux disease)    Hypertension    Lupus (HCC)     Family History  Problem  Relation Age of Onset   Cancer Father        colon   Hypertension Father    Heart disease Father    Cancer Sister        Cervical   Cancer Brother        prostate    Heart disease Maternal Grandfather    Hypertension Paternal Grandmother    Heart disease Paternal Grandmother    Breast cancer Neg Hx    Past Surgical History:  Procedure Laterality Date   BREAST LUMPECTOMY Left 2012   BREAST SURGERY  03/10/2011   Lt br lumpectomy   CATARACT EXTRACTION     08/2022 and 09/2022   COLONOSCOPY     Removed 2 polpys    KNEE SURGERY     SHOULDER SURGERY     TUBAL LIGATION     Social History   Social History Narrative   Not on file   Immunization History  Administered Date(s) Administered   PFIZER(Purple Top)SARS-COV-2 Vaccination 03/04/2020, 03/25/2020     Objective: Vital Signs: There were no vitals taken for this visit.   Physical Exam   Musculoskeletal Exam: ***  CDAI Exam: CDAI Score: -- Patient Global: --; Provider Global: -- Swollen: --; Tender: -- Joint Exam 05/12/2023   No joint exam has been documented for this visit   There is currently no information documented on the homunculus. Go to the Rheumatology activity and complete the homunculus joint exam.  Investigation: No additional findings.  Imaging: No results found.  Recent Labs: Lab Results  Component Value Date   WBC 3.5 (L) 02/04/2023   HGB 12.6 02/04/2023  PLT 224 02/04/2023   NA 140 02/04/2023   K 4.3 02/04/2023   CL 101 02/04/2023   CO2 33 (H) 02/04/2023   GLUCOSE 105 (H) 02/04/2023   BUN 21 02/04/2023   CREATININE 1.07 (H) 02/04/2023   BILITOT 0.4 02/04/2023   ALKPHOS 86 07/01/2017   AST 30 02/04/2023   ALT 25 02/04/2023   PROT 7.1 02/04/2023   ALBUMIN 4.3 07/01/2017   CALCIUM 9.3 02/04/2023   GFRAA 99 02/26/2021    Speciality Comments: No specialty comments available.  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  No orders of the defined types were placed in this encounter.   Face-to-face time spent with patient was *** minutes. Greater than 50% of time was spent in counseling and coordination of care.  Follow-Up Instructions: No follow-ups on file.   Gearldine Bienenstock, PA-C  Note - This record has been created using Dragon software.  Chart creation errors have been sought, but may not always  have been located. Such creation errors do not reflect on  the standard of medical care.

## 2023-05-07 NOTE — Progress Notes (Unsigned)
Office Visit Note  Patient: Deborah Levy             Date of Birth: 1956-02-15           MRN: [id number]             PCP: Kerin Salen, PA-C Referring: Drosinis, Leonia Reader, PA-C Visit Date: 05/20/2023 Occupation: @GUAROCC @  Subjective:  Medication monitoring   History of Present Illness: Deborah Levy is a 67 y.o. female with history of systemic lupus, cutaneous lupus, and fibromyalgia.  Patient remains on MTX 0.3 ml sq once weekly and folic acid 1 mg po daily. Reduced dose of MTX due to elevated LFTs.  She has not missed any doses of methotrexate recently.  She denies any cutaneous lupus lesions at this time.  She states that she recently went to the beach and wore sunscreen SPF 70 and sun protective clothing.  She denies any signs or symptoms of a systemic lupus flare.  Her sicca symptoms have been stable.  She denies any swollen lymph nodes.  She denies any symptoms of Raynaud's phenomenon.  She has not had any shortness of breath or pleuritic chest pain.  Patient continues to have intermittent myalgias and muscle tenderness due to fibromyalgia.  She is also having ongoing pain in her right knee joint.  She is under the care of Dr. Ludwig Clarks and is scheduled to have the third Visco injection of the series this Friday.  She has not yet noticed any improvement in her right knee joint pain.     Activities of Daily Living:  Patient reports morning stiffness for 2 hours.   Patient Reports nocturnal pain.  Difficulty dressing/grooming: Denies Difficulty climbing stairs: Reports Difficulty getting out of chair: Denies Difficulty using hands for taps, buttons, cutlery, and/or writing: Denies  Review of Systems  Constitutional:  Positive for fatigue.  HENT:  Positive for mouth dryness. Negative for mouth sores.   Eyes:  Positive for dryness.  Respiratory:  Negative for shortness of breath.   Cardiovascular:  Negative for chest pain and palpitations.   Gastrointestinal:  Positive for constipation. Negative for blood in stool and diarrhea.  Endocrine: Negative for increased urination.  Genitourinary:  Positive for involuntary urination.  Musculoskeletal:  Positive for joint pain, gait problem, joint pain, joint swelling, myalgias, muscle weakness, morning stiffness, muscle tenderness and myalgias.  Skin:  Positive for hair loss and sensitivity to sunlight. Negative for color change and rash.  Allergic/Immunologic: Negative for susceptible to infections.  Neurological:  Positive for dizziness and headaches.  Hematological:  Negative for swollen glands.  Psychiatric/Behavioral:  Positive for depressed mood and sleep disturbance. The patient is nervous/anxious.     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   Type 2 diabetes mellitus (HCC) 11/22/2014   Chronic pain 11/13/2014   Fibromyalgia 11/13/2014   Rotator cuff impingement syndrome 11/13/2014   Cannot sleep 02/13/2014   Essential (primary) hypertension 09/07/2013   HLD (hyperlipidemia) 09/07/2013   Adiposity 09/07/2013   Systemic lupus erythematosus (HCC) 05/21/2013   Hypokalemia 04/21/2013   Breast cancer of lower-inner quadrant of left female breast (HCC) 01/12/2012    Past Medical History:  Diagnosis Date   Breast cancer (HCC)    Cancer (HCC)    Fibromyalgia    GERD (gastroesophageal reflux disease)    Hypertension    Lupus (HCC)    Osteoarthritis     Family History  Problem Relation Age of Onset   Cancer Father        colon   Hypertension Father    Heart disease Father    Cancer Sister        Cervical   Cancer Brother        prostate     Heart disease Maternal Grandfather    Hypertension Paternal Grandmother    Heart disease Paternal Grandmother    Breast cancer Neg Hx    Past Surgical History:  Procedure Laterality Date   BREAST LUMPECTOMY Left 2012   BREAST SURGERY  03/10/2011   Lt br lumpectomy   CATARACT EXTRACTION     08/2022 and 09/2022   COLONOSCOPY     Removed 2 polpys    KNEE SURGERY     SHOULDER SURGERY     TUBAL LIGATION     Social History   Social History Narrative   Not on file   Immunization History  Administered Date(s) Administered   PFIZER(Purple Top)SARS-COV-2 Vaccination 03/04/2020, 03/25/2020     Objective: Vital Signs: BP 98/63 (BP Location: Right Arm, Patient Position: Sitting, Cuff Size: Normal)   Pulse 85   Resp 16   Ht 4' 10.5" (1.486 m)   Wt 153 lb (69.4 kg)   BMI 31.43 kg/m    Physical Exam Vitals and nursing note reviewed.  Constitutional:      Appearance: She is well-developed.  HENT:     Head: Normocephalic and atraumatic.  Eyes:     Conjunctiva/sclera: Conjunctivae normal.  Cardiovascular:     Rate and Rhythm: Normal rate and regular rhythm.     Heart sounds: Normal heart sounds.  Pulmonary:     Effort: Pulmonary effort is normal.     Breath sounds: Normal breath sounds.  Abdominal:     General: Bowel sounds are normal.     Palpations: Abdomen is soft.  Musculoskeletal:     Cervical back: Normal range of motion.  Lymphadenopathy:     Cervical: No cervical adenopathy.  Skin:    General: Skin is warm and dry.     Capillary Refill: Capillary refill takes less than 2 seconds.  Neurological:     Mental Status: She is alert and oriented to person, place, and time.  Psychiatric:        Behavior: Behavior normal.      Musculoskeletal Exam: Generalized hyperalgesia and positive tender points on exam.  C-spine has good range of motion.  Shoulder joints, elbow joints, wrist joints, MCPs, PIPs, DIPs have good range of motion with no synovitis.  Complete fist  formation bilaterally.  Hip joints have good range of motion with no groin pain.  Some tenderness over the trochanteric bursa bilaterally.  Painful range of motion of the right knee with warmth but no effusion.  Left knee joint has good range of motion with no warmth or effusion.  Ankle joints have good range of motion with no tenderness or joint swelling.  CDAI Exam: CDAI Score: -- Patient Global: --; Provider Global: -- Swollen: --; Tender: -- Joint Exam 05/20/2023   No joint exam has been documented for this visit   There is currently no information documented on the homunculus. Go to the Rheumatology activity and complete the  homunculus joint exam.  Investigation: No additional findings.  Imaging: No results found.  Recent Labs: Lab Results  Component Value Date   WBC 3.5 (L) 02/04/2023   HGB 12.6 02/04/2023   PLT 224 02/04/2023   NA 140 02/04/2023   K 4.3 02/04/2023   CL 101 02/04/2023   CO2 33 (H) 02/04/2023   GLUCOSE 105 (H) 02/04/2023   BUN 21 02/04/2023   CREATININE 1.07 (H) 02/04/2023   BILITOT 0.4 02/04/2023   ALKPHOS 86 07/01/2017   AST 30 02/04/2023   ALT 25 02/04/2023   PROT 7.1 02/04/2023   ALBUMIN 4.3 07/01/2017   CALCIUM 9.3 02/04/2023   GFRAA 99 02/26/2021    Speciality Comments: No specialty comments available.  Procedures:  No procedures performed Allergies: Sulfa antibiotics, Escitalopram, Metformin, and Sulfasalazine    Assessment / Plan:     Visit Diagnoses: Other systemic lupus erythematosus with other organ involvement (HCC) - - Fatigue, arthralgia, malar rash, photosensitivity, positive subcutaneous lupus on biopsy, Positive ANA, Ro+ and La +: She is not currently experiencing any signs or symptoms of a systemic lupus flare.  She has clinically been doing well on methotrexate 0.3 mL subcu injections once weekly.  She has not had any cutaneous lupus flares.  She has no synovitis on examination today.  She has not had any oral or nasal  ulcerations.  She has chronic sicca symptoms which have been unchanged.  No cervical lymphadenopathy.  She continues to have chronic fatigue secondary to fibromyalgia. Lab work from 02/04/23: ANA remains positive, dsDNA is negative, complements WNL, ESR WNL, protein creatinine ratio WNL.  The following lab work will be obtained today for further evaluation.  She will remain on methotrexate 0.3 mL sq injections once weekly along with folic acid 1 mg daily.  Discussed reducing methotrexate to 0.2 mL sq injections once weekly due to ongoing GI side effects with weekly methotrexate dosing but she is apprehensive at this time.  No medication changes will be made at this time.  She was advised to notify us if she develops any signs or symptoms of a systemic lupus flare.  She will follow-up in the office in 3 months or sooner if needed. - Plan: CBC with Differential/Platelet, COMPLETE METABOLIC PANEL WITH GFR, Protein / creatinine ratio, urine, Anti-DNA antibody, double-stranded, C3 and C4, Sedimentation rate  High risk medication use - MTX 0.3 ml sq once weekly and folic acid 1 mg po daily. Reduced dose of MTX due to elevated LFTs.  CBC and CMP updated on 02/04/23. Orders for CBC and CMP released today.   No recent or recurrent infections.  Discussed the importance of holding methotrexate if she develops signs or symptoms of an infection and to resume once the infection has completely cleared.  - Plan: CBC with Differential/Platelet, COMPLETE METABOLIC PANEL WITH GFR  Cutaneous lupus erythematosus - [facility]-No signs or symptoms of a flare.  She remains on low-dose methotrexate 0.3 mL sq injections once weekly.  No missed doses recently.  Discussed the importance of avoiding direct sun exposure and to wear sunscreen SPF 50 or above on a daily basis.  She was encouraged to wear sun protective clothing and a hat when she will be traveling to [place] in July.  She will notify us if she develops signs or  symptoms of a flare.  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   Primary osteoarthritis of right knee: Under the care of Dr. Maureen Chatters.  She has been  undergoing viscosupplementation in the right knee due to inadequate response to cortisone injections in the past.  She will be receiving the third injection of the series on Friday.  She has painful range of motion and warmth in the right knee but no effusion noted today.  Fibromyalgia - She has generalized hyperalgesia and positive tender points on examination.  She remains on Lyrica, Celebrex, Belbuca, and tramadol for pain relief.  Discussed the importance of regular exercise and good sleep hygiene.  Primary insomnia: Discussed the importance of good sleep hygiene.  She is taking melatonin at bedtime.  Other fatigue: Chronic and secondary to fibromyalgia.  Discussed the importance of regular exercise.  Trochanteric bursitis of both hips: She has tenderness over bilateral trochanteric bursa.  Other medical conditions are listed as follows:  Closed fracture of base of fifth metatarsal bone of left foot  Vitamin D deficiency  History of hypertension: Blood pressure was 98/63 today in the office.  History of hyperlipidemia  History of diabetes mellitus  History of gastroesophageal reflux (GERD)  History of depression  History of breast cancer  Orders: Orders Placed This Encounter  Procedures   CBC with Differential/Platelet   COMPLETE METABOLIC PANEL WITH GFR   Protein / creatinine ratio, urine   Anti-DNA antibody, double-stranded   C3 and C4   Sedimentation rate   No orders of the defined types were placed in this encounter.   Follow-Up Instructions: Return in about 3 months (around 08/20/2023) for Systemic lupus erythematosus, Fibromyalgia.   Gearldine Bienenstock, PA-C  Note - This record has been created using Dragon software.  Chart creation errors have been sought, but may not always   have been located. Such creation errors do not reflect on  the standard of medical care.

## 2023-05-12 ENCOUNTER — Ambulatory Visit: Payer: Medicare Other | Admitting: Physician Assistant

## 2023-05-12 DIAGNOSIS — Z8639 Personal history of other endocrine, nutritional and metabolic disease: Secondary | ICD-10-CM

## 2023-05-12 DIAGNOSIS — R5383 Other fatigue: Secondary | ICD-10-CM

## 2023-05-12 DIAGNOSIS — E559 Vitamin D deficiency, unspecified: Secondary | ICD-10-CM

## 2023-05-12 DIAGNOSIS — F5101 Primary insomnia: Secondary | ICD-10-CM

## 2023-05-12 DIAGNOSIS — M7062 Trochanteric bursitis, left hip: Secondary | ICD-10-CM

## 2023-05-12 DIAGNOSIS — Z79899 Other long term (current) drug therapy: Secondary | ICD-10-CM

## 2023-05-12 DIAGNOSIS — S92352A Displaced fracture of fifth metatarsal bone, left foot, initial encounter for closed fracture: Secondary | ICD-10-CM

## 2023-05-12 DIAGNOSIS — M797 Fibromyalgia: Secondary | ICD-10-CM

## 2023-05-12 DIAGNOSIS — L932 Other local lupus erythematosus: Secondary | ICD-10-CM

## 2023-05-12 DIAGNOSIS — Z8659 Personal history of other mental and behavioral disorders: Secondary | ICD-10-CM

## 2023-05-12 DIAGNOSIS — Z8719 Personal history of other diseases of the digestive system: Secondary | ICD-10-CM

## 2023-05-12 DIAGNOSIS — M1712 Unilateral primary osteoarthritis, left knee: Secondary | ICD-10-CM

## 2023-05-12 DIAGNOSIS — Z8679 Personal history of other diseases of the circulatory system: Secondary | ICD-10-CM

## 2023-05-12 DIAGNOSIS — Z853 Personal history of malignant neoplasm of breast: Secondary | ICD-10-CM

## 2023-05-12 DIAGNOSIS — H35389 Toxic maculopathy, unspecified eye: Secondary | ICD-10-CM

## 2023-05-12 DIAGNOSIS — M3219 Other organ or system involvement in systemic lupus erythematosus: Secondary | ICD-10-CM

## 2023-05-20 ENCOUNTER — Encounter: Payer: Self-pay | Admitting: Physician Assistant

## 2023-05-20 ENCOUNTER — Ambulatory Visit: Payer: Medicare Other | Attending: Physician Assistant | Admitting: Physician Assistant

## 2023-05-20 VITALS — BP 98/63 | HR 85 | Resp 16 | Ht 58.5 in | Wt 153.0 lb

## 2023-05-20 DIAGNOSIS — M3219 Other organ or system involvement in systemic lupus erythematosus: Secondary | ICD-10-CM | POA: Diagnosis not present

## 2023-05-20 DIAGNOSIS — Z8679 Personal history of other diseases of the circulatory system: Secondary | ICD-10-CM

## 2023-05-20 DIAGNOSIS — S92352A Displaced fracture of fifth metatarsal bone, left foot, initial encounter for closed fracture: Secondary | ICD-10-CM

## 2023-05-20 DIAGNOSIS — Z8719 Personal history of other diseases of the digestive system: Secondary | ICD-10-CM

## 2023-05-20 DIAGNOSIS — Z79899 Other long term (current) drug therapy: Secondary | ICD-10-CM

## 2023-05-20 DIAGNOSIS — M1711 Unilateral primary osteoarthritis, right knee: Secondary | ICD-10-CM | POA: Diagnosis not present

## 2023-05-20 DIAGNOSIS — F5101 Primary insomnia: Secondary | ICD-10-CM

## 2023-05-20 DIAGNOSIS — M1712 Unilateral primary osteoarthritis, left knee: Secondary | ICD-10-CM

## 2023-05-20 DIAGNOSIS — E559 Vitamin D deficiency, unspecified: Secondary | ICD-10-CM

## 2023-05-20 DIAGNOSIS — R5383 Other fatigue: Secondary | ICD-10-CM

## 2023-05-20 DIAGNOSIS — M7062 Trochanteric bursitis, left hip: Secondary | ICD-10-CM

## 2023-05-20 DIAGNOSIS — M797 Fibromyalgia: Secondary | ICD-10-CM

## 2023-05-20 DIAGNOSIS — Z8639 Personal history of other endocrine, nutritional and metabolic disease: Secondary | ICD-10-CM

## 2023-05-20 DIAGNOSIS — L932 Other local lupus erythematosus: Secondary | ICD-10-CM

## 2023-05-20 DIAGNOSIS — M7061 Trochanteric bursitis, right hip: Secondary | ICD-10-CM

## 2023-05-20 DIAGNOSIS — Z853 Personal history of malignant neoplasm of breast: Secondary | ICD-10-CM

## 2023-05-20 DIAGNOSIS — H35389 Toxic maculopathy, unspecified eye: Secondary | ICD-10-CM

## 2023-05-20 DIAGNOSIS — T372X5A Adverse effect of antimalarials and drugs acting on other blood protozoa, initial encounter: Secondary | ICD-10-CM

## 2023-05-20 DIAGNOSIS — Z8659 Personal history of other mental and behavioral disorders: Secondary | ICD-10-CM

## 2023-05-21 LAB — CBC WITH DIFFERENTIAL/PLATELET
Absolute Monocytes: 260 cells/uL (ref 200–950)
Basophils Absolute: 22 cells/uL (ref 0–200)
Basophils Relative: 0.5 %
Eosinophils Absolute: 220 cells/uL (ref 15–500)
Eosinophils Relative: 5 %
HCT: 37.4 % (ref 35.0–45.0)
Hemoglobin: 12.4 g/dL (ref 11.7–15.5)
Lymphs Abs: 1434 cells/uL (ref 850–3900)
MCH: 29.5 pg (ref 27.0–33.0)
MCHC: 33.2 g/dL (ref 32.0–36.0)
MCV: 89 fL (ref 80.0–100.0)
MPV: 10.6 fL (ref 7.5–12.5)
Monocytes Relative: 5.9 %
Neutro Abs: 2464 cells/uL (ref 1500–7800)
Neutrophils Relative %: 56 %
Platelets: 219 10*3/uL (ref 140–400)
RBC: 4.2 10*6/uL (ref 3.80–5.10)
RDW: 13.8 % (ref 11.0–15.0)
Total Lymphocyte: 32.6 %
WBC: 4.4 10*3/uL (ref 3.8–10.8)

## 2023-05-21 LAB — COMPLETE METABOLIC PANEL WITH GFR
AG Ratio: 1.9 (calc) (ref 1.0–2.5)
ALT: 29 U/L (ref 6–29)
AST: 36 U/L — ABNORMAL HIGH (ref 10–35)
Albumin: 4.1 g/dL (ref 3.6–5.1)
Alkaline phosphatase (APISO): 73 U/L (ref 37–153)
BUN: 17 mg/dL (ref 7–25)
CO2: 32 mmol/L (ref 20–32)
Calcium: 9.2 mg/dL (ref 8.6–10.4)
Chloride: 98 mmol/L (ref 98–110)
Creat: 0.92 mg/dL (ref 0.50–1.05)
Globulin: 2.2 g/dL (calc) (ref 1.9–3.7)
Glucose, Bld: 148 mg/dL — ABNORMAL HIGH (ref 65–99)
Potassium: 4.2 mmol/L (ref 3.5–5.3)
Sodium: 138 mmol/L (ref 135–146)
Total Bilirubin: 0.5 mg/dL (ref 0.2–1.2)
Total Protein: 6.3 g/dL (ref 6.1–8.1)
eGFR: 68 mL/min/{1.73_m2} (ref >=60)

## 2023-05-21 LAB — SEDIMENTATION RATE: Sed Rate: 19 mm/h (ref 0–30)

## 2023-05-21 LAB — PROTEIN / CREATININE RATIO, URINE
Creatinine, Urine: 172 mg/dL (ref 20–275)
Protein/Creat Ratio: 157 mg/g creat (ref 24–184)
Protein/Creatinine Ratio: 0.157 mg/mg creat (ref 0.024–0.184)
Total Protein, Urine: 27 mg/dL — ABNORMAL HIGH (ref 5–24)

## 2023-05-21 LAB — C3 AND C4
C3 Complement: 149 mg/dL (ref 83–193)
C4 Complement: 32 mg/dL (ref 15–57)

## 2023-05-21 LAB — ANTI-DNA ANTIBODY, DOUBLE-STRANDED: ds DNA Ab: <1 IU/mL

## 2023-05-21 NOTE — Progress Notes (Signed)
ESR WNL CBC WNL Glucose is 148. AST is borderline elevated-36. Less than 35 is normal.  Avoid excessive use of tylenol or alcohol use. Rest of CMP WNL.  Total urine protein is borderline elevated. Protein creatinine ratio is WNL. We will continue to monitor.

## 2023-05-21 NOTE — Progress Notes (Signed)
Complements WNL

## 2023-05-22 NOTE — Progress Notes (Signed)
dsDNA negative.

## 2023-06-11 ENCOUNTER — Other Ambulatory Visit: Payer: Self-pay | Admitting: Physician Assistant

## 2023-06-11 NOTE — Telephone Encounter (Signed)
Last Fill: 10/03/2022  Labs: 05/20/2023 ESR WNL CBC WNL Glucose is 148. AST is borderline elevated-36. Less than 35 is normal.  Avoid excessive use of tylenol or alcohol use. Rest of CMP WNL. Total urine protein is borderline elevated. Protein creatinine ratio is WNL. We will continue to monitor. Complements WNL. dsDNA negative   Next Visit: 08/24/2023  Last Visit: 05/20/2023  DX: Other systemic lupus erythematosus with other organ involvement   Current Dose per office note on 05/20/2023: MTX 0.3 ml sq once weekly   Okay to refill Methotrexate?

## 2023-06-22 ENCOUNTER — Encounter: Payer: Self-pay | Admitting: Internal Medicine

## 2023-06-22 DIAGNOSIS — N63 Unspecified lump in unspecified breast: Secondary | ICD-10-CM

## 2023-07-29 ENCOUNTER — Other Ambulatory Visit: Payer: Self-pay | Admitting: Physician Assistant

## 2023-07-29 NOTE — Telephone Encounter (Signed)
Last Fill: 04/09/2022  Next Visit: 08/24/2023  Last Visit: 05/20/2023  Dx: Other systemic lupus erythematosus with other organ involvement   Current Dose per office note on 05/20/2023: not discussed. Patient on MTX  Okay to refill Zofran?

## 2023-08-11 NOTE — Progress Notes (Signed)
Office Visit Note  Patient: Deborah Levy             Date of Birth: [birth year]           MRN: [id number]             PCP: Kerin Salen, PA-C Referring: Kathaleen Bury* Visit Date: 08/24/2023 Occupation: @GUAROCC @  Subjective:  Medication monitoring    History of Present Illness: Deborah Levy is a 67 y.o. female with history of systemic lupus erythematosus.  Patient remains on MTX 0.3 ml sq once weekly and folic acid 1 mg po daily.  She is tolerating methotrexate without any side effects and has not missed any doses recently.  She denies any recent systemic lupus flares.  She states that most of her symptoms are due to fibromyalgia.  She continues to have ongoing myofascial pain as well as fatigue.  She remains under the care of pain management.  She has ongoing pain in the right knee joint.  She tried viscosupplementation with no improvement and had a recent cortisone injection performed by her orthopedist which provided temporary relief.  She notices intermittent pain and swelling in both hands due to underlying osteoarthritis. She denies any sores in her mouth or nose.  She denies any swollen lymph nodes.    Activities of Daily Living:  Patient reports morning stiffness for 2 hours.   Patient Reports nocturnal pain.  Difficulty dressing/grooming: Reports Difficulty climbing stairs: Reports Difficulty getting out of chair: Denies Difficulty using hands for taps, buttons, cutlery, and/or writing: Denies  Review of Systems  Constitutional:  Positive for fatigue.  HENT:  Positive for mouth dryness. Negative for mouth sores.   Eyes:  Positive for dryness.  Respiratory:  Negative for shortness of breath.   Cardiovascular:  Negative for chest pain and palpitations.  Gastrointestinal:  Positive for nausea. Negative for blood in stool, constipation and diarrhea.  Endocrine: Negative for increased urination.  Genitourinary:  Positive for nocturia.  Negative for involuntary urination.  Musculoskeletal:  Positive for joint pain, gait problem, joint pain, joint swelling, myalgias, muscle weakness, morning stiffness, muscle tenderness and myalgias.  Skin:  Positive for hair loss. Negative for color change, rash and sensitivity to sunlight.  Allergic/Immunologic: Negative for susceptible to infections.  Neurological:  Positive for dizziness and headaches.  Hematological:  Negative for swollen glands.  Psychiatric/Behavioral:  Positive for depressed mood and sleep disturbance. The patient is nervous/anxious.     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   Type 2 diabetes mellitus (HCC) 11/22/2014   Chronic pain 11/13/2014   Fibromyalgia 11/13/2014   Rotator cuff impingement syndrome 11/13/2014   Cannot sleep 02/13/2014   Essential (primary) hypertension 09/07/2013   HLD (hyperlipidemia) 09/07/2013   Adiposity 09/07/2013   Systemic lupus erythematosus (HCC) 05/21/2013   Hypokalemia 04/21/2013   Breast cancer of lower-inner quadrant of left female breast (HCC) 01/12/2012    Past Medical History:  Diagnosis Date   Breast cancer (HCC)    Cancer (HCC)    Fibromyalgia    GERD (gastroesophageal reflux disease)    Hypertension    Lupus (HCC)    Osteoarthritis  Family History  Problem Relation Age of Onset   Cancer Father        colon   Hypertension Father    Heart disease Father    Cancer Sister        Cervical   Cancer Brother        prostate    Heart disease Maternal Grandfather    Hypertension Paternal Grandmother    Heart disease Paternal Grandmother    Breast cancer Neg Hx    Past Surgical  History:  Procedure Laterality Date   BREAST LUMPECTOMY Left 2012   BREAST SURGERY  03/10/2011   Lt br lumpectomy   CATARACT EXTRACTION     08/2022 and 09/2022   COLONOSCOPY     Removed 2 polpys    KNEE SURGERY     SHOULDER SURGERY     TUBAL LIGATION     Social History   Social History Narrative   Not on file   Immunization History  Administered Date(s) Administered   PFIZER(Purple Top)SARS-COV-2 Vaccination 03/04/2020, 03/25/2020, 12/03/2020   Pfizer Covid-19 Vaccine Bivalent Booster 62yrs & up 09/07/2021     Objective: Vital Signs: BP 97/63 (BP Location: Right Arm, Patient Position: Sitting, Cuff Size: Normal)   Pulse 82   Resp 15   Ht 5' (1.524 m)   Wt 156 lb 12.8 oz (71.1 kg)   BMI 30.62 kg/m    Physical Exam Vitals and nursing note reviewed.  Constitutional:      Appearance: She is well-developed.  HENT:     Head: Normocephalic and atraumatic.  Eyes:     Conjunctiva/sclera: Conjunctivae normal.  Cardiovascular:     Rate and Rhythm: Normal rate and regular rhythm.     Heart sounds: Normal heart sounds.  Pulmonary:     Effort: Pulmonary effort is normal.     Breath sounds: Normal breath sounds.  Abdominal:     General: Bowel sounds are normal.     Palpations: Abdomen is soft.  Musculoskeletal:     Cervical back: Normal range of motion.  Lymphadenopathy:     Cervical: No cervical adenopathy.  Skin:    General: Skin is warm and dry.     Capillary Refill: Capillary refill takes less than 2 seconds.  Neurological:     Mental Status: She is alert and oriented to person, place, and time.  Psychiatric:        Behavior: Behavior normal.      Musculoskeletal Exam: Generalized hyperalgesia and positive tender points on examination.  C-spine has good range of motion.  Shoulder joints, elbow joints, wrist joints, MCPs, PIPs, DIPs have good range of motion with no synovitis.  Hip joints have good range of motion with no groin pain.  Right knee has limited  extension.  Ankle joints have good range of motion with no tenderness or joint swelling.  CDAI Exam: CDAI Score: -- Patient Global: --; Provider Global: -- Swollen: --; Tender: -- Joint Exam 08/24/2023   No joint exam has been documented for this visit   There is currently no information documented on the homunculus. Go to the Rheumatology activity and complete the homunculus joint exam.  Investigation: No additional findings.  Imaging: No results found.  Recent Labs: Lab Results  Component Value Date   WBC 4.4 05/20/2023   HGB 12.4 05/20/2023   PLT 219 05/20/2023   NA 138 05/20/2023   K 4.2 05/20/2023   CL 98 05/20/2023   CO2 32 05/20/2023   GLUCOSE 148 (H)  05/20/2023   BUN 17 05/20/2023   CREATININE 0.92 05/20/2023   BILITOT 0.5 05/20/2023   ALKPHOS 86 07/01/2017   AST 36 (H) 05/20/2023   ALT 29 05/20/2023   PROT 6.3 05/20/2023   ALBUMIN 4.3 07/01/2017   CALCIUM 9.2 05/20/2023   GFRAA 99 02/26/2021    Speciality Comments: No specialty comments available.  Procedures:  No procedures performed Allergies: Sulfa antibiotics, Escitalopram, Metformin, and Sulfasalazine   Assessment / Plan:     Visit Diagnoses: Other systemic lupus erythematosus with other organ involvement (HCC) - Fatigue, arthralgia, malar rash, photosensitivity, positive subcutaneous lupus on biopsy, Positive ANA, Ro+ and La +: She has not had any signs or symptoms of a systemic lupus flare.  She has clinically been doing well taking methotrexate 0.3 mL sq injections once weekly along with folic acid 1 mg daily.  She has no synovitis on examination today.  No malar rash was evident.  She has been avoiding direct sun exposure.  She has not had any oral or nasal ulcerations.  Sicca symptoms have been stable.  Her primary symptoms remain chronic pain secondary to fibromyalgia and chronic fatigue.  She remains under the care of pain management. Plan to obtain the following lab work today.  Lab work from  05/20/2023 was reviewed today in the office: Sed rate within normal limits, complements within normal limits, and double-stranded DNA negative.  She will remain on low dose methotrexate as prescribed. - Plan: CBC with Differential/Platelet, COMPLETE METABOLIC PANEL WITH GFR, Protein / creatinine ratio, urine, Anti-DNA antibody, double-stranded, C3 and C4, Sedimentation rate  High risk medication use - MTX 0.3 ml sq once weekly and folic acid 1 mg po daily. Reduced dose of MTX due to elevated LFTs. CMP updated on 06/19/2023. CBC and CMP updated today. No recent or recurrent infections. - Plan: CBC with Differential/Platelet, COMPLETE METABOLIC PANEL WITH GFR  Cutaneous lupus erythematosus - [facility]: No signs or symptoms of a flare.  Avoiding direct sun exposure as advised.  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  Limited extension of the right knee noted on examination today.  Patient had an adequate response to viscosupplementation performed by orthopedics.  She had a recent right knee joint cortisone injection which provided temporary relief.  Discussed bracing or physical therapy as treatment for chronic osteoarthritic pain.  Apprehensive to proceed with surgical intervention.  Fibromyalgia - She remains on Lyrica, Celebrex, Belbuca, and tramadol for pain relief.  She has generalized hyperalgesia and positive tender points on exam.  Myofascial pain and fatigue remain her primary symptoms.  Primary insomnia: Secondary to nocturnal pain.  She takes melatonin at bedtime.  Other fatigue: Chronic, stable.  Trochanteric bursitis of both hips: Chronic pain.  Other medical conditions are listed as follows:  Closed fracture of base of fifth metatarsal bone of left foot  Vitamin D deficiency  History of hypertension: Blood pressure was 97/63 today in the office.  History of hyperlipidemia  History of diabetes  mellitus  History of gastroesophageal reflux (GERD)  History of depression  History of breast cancer  Orders: Orders Placed This Encounter  Procedures   CBC with Differential/Platelet   COMPLETE METABOLIC PANEL WITH GFR   Protein / creatinine ratio, urine   Anti-DNA antibody, double-stranded   C3 and C4   Sedimentation rate   No orders of the defined types were placed in this encounter.   Follow-Up Instructions: Return in about 3 months (around  11/23/2023) for Systemic lupus erythematosus, Fibromyalgia.   Gearldine Bienenstock, PA-C  Note - This record has been created using Dragon software.  Chart creation errors have been sought, but may not always  have been located. Such creation errors do not reflect on  the standard of medical care.

## 2023-08-24 ENCOUNTER — Encounter: Payer: Self-pay | Admitting: Physician Assistant

## 2023-08-24 ENCOUNTER — Ambulatory Visit: Payer: Medicare Other | Attending: Physician Assistant | Admitting: Physician Assistant

## 2023-08-24 VITALS — BP 97/63 | HR 82 | Resp 15 | Ht 60.0 in | Wt 156.8 lb

## 2023-08-24 DIAGNOSIS — S92352A Displaced fracture of fifth metatarsal bone, left foot, initial encounter for closed fracture: Secondary | ICD-10-CM

## 2023-08-24 DIAGNOSIS — H35389 Toxic maculopathy, unspecified eye: Secondary | ICD-10-CM

## 2023-08-24 DIAGNOSIS — M1711 Unilateral primary osteoarthritis, right knee: Secondary | ICD-10-CM | POA: Diagnosis not present

## 2023-08-24 DIAGNOSIS — M7062 Trochanteric bursitis, left hip: Secondary | ICD-10-CM

## 2023-08-24 DIAGNOSIS — Z79899 Other long term (current) drug therapy: Secondary | ICD-10-CM | POA: Diagnosis not present

## 2023-08-24 DIAGNOSIS — M3219 Other organ or system involvement in systemic lupus erythematosus: Secondary | ICD-10-CM

## 2023-08-24 DIAGNOSIS — T372X5A Adverse effect of antimalarials and drugs acting on other blood protozoa, initial encounter: Secondary | ICD-10-CM

## 2023-08-24 DIAGNOSIS — M797 Fibromyalgia: Secondary | ICD-10-CM

## 2023-08-24 DIAGNOSIS — Z8719 Personal history of other diseases of the digestive system: Secondary | ICD-10-CM

## 2023-08-24 DIAGNOSIS — E559 Vitamin D deficiency, unspecified: Secondary | ICD-10-CM

## 2023-08-24 DIAGNOSIS — Z8639 Personal history of other endocrine, nutritional and metabolic disease: Secondary | ICD-10-CM

## 2023-08-24 DIAGNOSIS — F5101 Primary insomnia: Secondary | ICD-10-CM

## 2023-08-24 DIAGNOSIS — Z853 Personal history of malignant neoplasm of breast: Secondary | ICD-10-CM

## 2023-08-24 DIAGNOSIS — Z8679 Personal history of other diseases of the circulatory system: Secondary | ICD-10-CM

## 2023-08-24 DIAGNOSIS — R5383 Other fatigue: Secondary | ICD-10-CM

## 2023-08-24 DIAGNOSIS — L932 Other local lupus erythematosus: Secondary | ICD-10-CM

## 2023-08-24 DIAGNOSIS — M7061 Trochanteric bursitis, right hip: Secondary | ICD-10-CM

## 2023-08-24 DIAGNOSIS — Z8659 Personal history of other mental and behavioral disorders: Secondary | ICD-10-CM

## 2023-08-25 ENCOUNTER — Telehealth: Payer: Self-pay | Admitting: *Deleted

## 2023-08-25 DIAGNOSIS — Z79899 Other long term (current) drug therapy: Secondary | ICD-10-CM

## 2023-08-25 LAB — PROTEIN / CREATININE RATIO, URINE
Creatinine, Urine: 183 mg/dL (ref 20–275)
Protein/Creat Ratio: 131 mg/g{creat} (ref 24–184)
Protein/Creatinine Ratio: 0.131 mg/mg{creat} (ref 0.024–0.184)
Total Protein, Urine: 24 mg/dL (ref 5–24)

## 2023-08-25 LAB — COMPLETE METABOLIC PANEL WITH GFR
AG Ratio: 1.8 (calc) (ref 1.0–2.5)
ALT: 28 U/L (ref 6–29)
AST: 30 U/L (ref 10–35)
Albumin: 4 g/dL (ref 3.6–5.1)
Alkaline phosphatase (APISO): 82 U/L (ref 37–153)
BUN/Creatinine Ratio: 18 (calc) (ref 6–22)
BUN: 21 mg/dL (ref 7–25)
CO2: 31 mmol/L (ref 20–32)
Calcium: 9.1 mg/dL (ref 8.6–10.4)
Chloride: 101 mmol/L (ref 98–110)
Creat: 1.19 mg/dL — ABNORMAL HIGH (ref 0.50–1.05)
Globulin: 2.2 g/dL (ref 1.9–3.7)
Glucose, Bld: 125 mg/dL — ABNORMAL HIGH (ref 65–99)
Potassium: 4.8 mmol/L (ref 3.5–5.3)
Sodium: 139 mmol/L (ref 135–146)
Total Bilirubin: 0.5 mg/dL (ref 0.2–1.2)
Total Protein: 6.2 g/dL (ref 6.1–8.1)
eGFR: 49 mL/min/{1.73_m2} — ABNORMAL LOW (ref >=60)

## 2023-08-25 LAB — ANTI-DNA ANTIBODY, DOUBLE-STRANDED: ds DNA Ab: <1 [IU]/mL

## 2023-08-25 LAB — CBC WITH DIFFERENTIAL/PLATELET
Absolute Monocytes: 749 {cells}/uL (ref 200–950)
Basophils Absolute: 7 {cells}/uL (ref 0–200)
Basophils Relative: 0.1 %
Eosinophils Absolute: 147 {cells}/uL (ref 15–500)
Eosinophils Relative: 2.1 %
HCT: 35.4 % (ref 35.0–45.0)
Hemoglobin: 11.9 g/dL (ref 11.7–15.5)
Lymphs Abs: 1540 {cells}/uL (ref 850–3900)
MCH: 29.8 pg (ref 27.0–33.0)
MCHC: 33.6 g/dL (ref 32.0–36.0)
MCV: 88.7 fL (ref 80.0–100.0)
MPV: 10.8 fL (ref 7.5–12.5)
Monocytes Relative: 10.7 %
Neutro Abs: 4557 {cells}/uL (ref 1500–7800)
Neutrophils Relative %: 65.1 %
Platelets: 200 10*3/uL (ref 140–400)
RBC: 3.99 10*6/uL (ref 3.80–5.10)
RDW: 14.4 % (ref 11.0–15.0)
Total Lymphocyte: 22 %
WBC: 7 10*3/uL (ref 3.8–10.8)

## 2023-08-25 LAB — C3 AND C4
C3 Complement: 157 mg/dL (ref 83–193)
C4 Complement: 27 mg/dL (ref 15–57)

## 2023-08-25 LAB — SEDIMENTATION RATE: Sed Rate: 28 mm/h (ref 0–30)

## 2023-08-25 NOTE — Progress Notes (Signed)
Glucose is 125.  Creatinine is elevated-1.19 and GFR is low-49.  Avoid the use of NSAIDs while on methotrexate.  Recommend rechecking BMP with GFR in 2-3 weeks CBC Wnl ESR WNL Protein creatinine ratio WNL.

## 2023-08-25 NOTE — Progress Notes (Signed)
Complements WNL.  ESR WNL.

## 2023-08-25 NOTE — Telephone Encounter (Signed)
-----   Message from Gearldine Bienenstock sent at 08/25/2023  6:44 AM EDT ----- Glucose is 125.  Creatinine is elevated-1.19 and GFR is low-49.  Avoid the use of NSAIDs while on methotrexate.  Recommend rechecking BMP with GFR in 2-3 weeks CBC Wnl ESR WNL Protein creatinine ratio WNL.

## 2023-08-26 NOTE — Progress Notes (Signed)
dsDNA is negative

## 2023-09-08 ENCOUNTER — Telehealth: Payer: Self-pay | Admitting: *Deleted

## 2023-09-08 ENCOUNTER — Other Ambulatory Visit: Payer: Self-pay | Admitting: Physician Assistant

## 2023-09-08 NOTE — Progress Notes (Unsigned)
Office Visit Note  Patient: Deborah Levy             Date of Birth: 11/05/1956           MRN: [id number]             PCP: Kerin Salen, PA-C Referring: Kathaleen Bury* Visit Date: 09/09/2023 Occupation: @GUAROCC @  Subjective:  No chief complaint on file.   History of Present Illness: Deborah Levy is a 67 y.o. female ***     Activities of Daily Living:  Patient reports morning stiffness for *** {minute/hour:19697}.   Patient {ACTIONS;DENIES/REPORTS:21021675::"Denies"} nocturnal pain.  Difficulty dressing/grooming: {ACTIONS;DENIES/REPORTS:21021675::"Denies"} Difficulty climbing stairs: {ACTIONS;DENIES/REPORTS:21021675::"Denies"} Difficulty getting out of chair: {ACTIONS;DENIES/REPORTS:21021675::"Denies"} Difficulty using hands for taps, buttons, cutlery, and/or writing: {ACTIONS;DENIES/REPORTS:21021675::"Denies"}  No Rheumatology ROS completed.   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   Type 2 diabetes mellitus (HCC) 11/22/2014   Chronic pain 11/13/2014   Fibromyalgia 11/13/2014   Rotator cuff impingement syndrome 11/13/2014   Cannot sleep 02/13/2014   Essential (primary) hypertension 09/07/2013   HLD (hyperlipidemia) 09/07/2013   Adiposity 09/07/2013   Systemic lupus erythematosus (HCC) 05/21/2013   Hypokalemia 04/21/2013   Breast cancer of lower-inner quadrant of left female breast (HCC) 01/12/2012    Past Medical History:  Diagnosis Date   Breast cancer (HCC)    Cancer (HCC)    Fibromyalgia    GERD (gastroesophageal reflux disease)    Hypertension    Lupus (HCC)    Osteoarthritis     Family  History  Problem Relation Age of Onset   Cancer Father        colon   Hypertension Father    Heart disease Father    Cancer Sister        Cervical   Cancer Brother        prostate    Heart disease Maternal Grandfather    Hypertension Paternal Grandmother    Heart disease Paternal Grandmother    Breast cancer Neg Hx    Past Surgical History:  Procedure Laterality Date   BREAST LUMPECTOMY Left 2012   BREAST SURGERY  03/10/2011   Lt br lumpectomy   CATARACT EXTRACTION     08/2022 and 09/2022   COLONOSCOPY     Removed 2 polpys    KNEE SURGERY     SHOULDER SURGERY     TUBAL LIGATION     Social History   Social History Narrative   Not on file   Immunization History  Administered Date(s) Administered   PFIZER(Purple Top)SARS-COV-2 Vaccination 03/04/2020, 03/25/2020, 12/03/2020   Pfizer Covid-19 Vaccine Bivalent Booster 52yrs & up 09/07/2021     Objective: Vital Signs: There were no vitals taken for this visit.   Physical Exam   Musculoskeletal Exam: ***  CDAI Exam: CDAI Score: -- Patient Global: --; Provider Global: -- Swollen: --; Tender: -- Joint Exam 09/09/2023   No joint exam has been documented for this visit   There is currently no information documented on the homunculus. Go to the Rheumatology activity and complete the homunculus joint exam.  Investigation: No additional findings.  Imaging: No results found.  Recent Labs: Lab Results  Component Value Date   WBC 7.0 08/24/2023   HGB 11.9 08/24/2023   PLT 200 08/24/2023   NA 139 08/24/2023   K 4.8 08/24/2023   CL 101 08/24/2023   CO2 31 08/24/2023   GLUCOSE 125 (H) 08/24/2023   BUN 21 08/24/2023   CREATININE 1.19 (H) 08/24/2023   BILITOT 0.5 08/24/2023   ALKPHOS 86 07/01/2017   AST 30 08/24/2023   ALT 28 08/24/2023   PROT 6.2 08/24/2023   ALBUMIN 4.3 07/01/2017   CALCIUM 9.1 08/24/2023   GFRAA 99 02/26/2021    Speciality Comments: No specialty comments available.  Procedures:  No  procedures performed Allergies: Sulfa antibiotics, Escitalopram, Metformin, and Sulfasalazine   Assessment / Plan:     Visit Diagnoses: No diagnosis found.  Orders: No orders of the defined types were placed in this encounter.  No orders of the defined types were placed in this encounter.   Face-to-face time spent with patient was *** minutes. Greater than 50% of time was spent in counseling and coordination of care.  Follow-Up Instructions: No follow-ups on file.   Ellen Henri, CMA  Note - This record has been created using Animal nutritionist.  Chart creation errors have been sought, but may not always  have been located. Such creation errors do not reflect on  the standard of medical care.

## 2023-09-08 NOTE — Telephone Encounter (Signed)
Last Fill: 06/11/2023  Labs: 08/24/2023 glucose is 125.  Creatinine is elevated-1.19 and GFR is low-49.   CBC Wnl   Next Visit: 11/23/2023  Last Visit: 08/24/2023  DX: Other systemic lupus erythematosus with other organ involvement   Current Dose per office note 08/24/2023: MTX 0.3 ml sq once weekly   Okay to refill Methotrexate?

## 2023-09-08 NOTE — Telephone Encounter (Signed)
Patient called the office stating she is supposed to come in for labs and would like to know if she can schedule an appointment to be seen. Patient states she is having blisters and sore in her mouth for 3 days and wondering if she may be having a Lupus flare. Patient has been scheduled for and office visit on 09/09/2023.

## 2023-09-09 ENCOUNTER — Encounter: Payer: Self-pay | Admitting: Rheumatology

## 2023-09-09 ENCOUNTER — Ambulatory Visit: Payer: Medicare Other | Attending: Rheumatology | Admitting: Rheumatology

## 2023-09-09 VITALS — BP 109/68 | HR 70 | Resp 16 | Ht 59.0 in | Wt 157.0 lb

## 2023-09-09 DIAGNOSIS — T372X5A Adverse effect of antimalarials and drugs acting on other blood protozoa, initial encounter: Secondary | ICD-10-CM

## 2023-09-09 DIAGNOSIS — H35389 Toxic maculopathy, unspecified eye: Secondary | ICD-10-CM

## 2023-09-09 DIAGNOSIS — M7062 Trochanteric bursitis, left hip: Secondary | ICD-10-CM

## 2023-09-09 DIAGNOSIS — S92352A Displaced fracture of fifth metatarsal bone, left foot, initial encounter for closed fracture: Secondary | ICD-10-CM

## 2023-09-09 DIAGNOSIS — E559 Vitamin D deficiency, unspecified: Secondary | ICD-10-CM

## 2023-09-09 DIAGNOSIS — Z853 Personal history of malignant neoplasm of breast: Secondary | ICD-10-CM

## 2023-09-09 DIAGNOSIS — M797 Fibromyalgia: Secondary | ICD-10-CM

## 2023-09-09 DIAGNOSIS — G4733 Obstructive sleep apnea (adult) (pediatric): Secondary | ICD-10-CM

## 2023-09-09 DIAGNOSIS — M1711 Unilateral primary osteoarthritis, right knee: Secondary | ICD-10-CM

## 2023-09-09 DIAGNOSIS — M7061 Trochanteric bursitis, right hip: Secondary | ICD-10-CM

## 2023-09-09 DIAGNOSIS — M8589 Other specified disorders of bone density and structure, multiple sites: Secondary | ICD-10-CM

## 2023-09-09 DIAGNOSIS — R7989 Other specified abnormal findings of blood chemistry: Secondary | ICD-10-CM | POA: Diagnosis not present

## 2023-09-09 DIAGNOSIS — Z8659 Personal history of other mental and behavioral disorders: Secondary | ICD-10-CM

## 2023-09-09 DIAGNOSIS — L932 Other local lupus erythematosus: Secondary | ICD-10-CM

## 2023-09-09 DIAGNOSIS — Z79899 Other long term (current) drug therapy: Secondary | ICD-10-CM

## 2023-09-09 DIAGNOSIS — M3219 Other organ or system involvement in systemic lupus erythematosus: Secondary | ICD-10-CM

## 2023-09-09 DIAGNOSIS — Z8719 Personal history of other diseases of the digestive system: Secondary | ICD-10-CM

## 2023-09-09 DIAGNOSIS — Z8639 Personal history of other endocrine, nutritional and metabolic disease: Secondary | ICD-10-CM

## 2023-09-09 DIAGNOSIS — R5383 Other fatigue: Secondary | ICD-10-CM

## 2023-09-09 DIAGNOSIS — Z8679 Personal history of other diseases of the circulatory system: Secondary | ICD-10-CM

## 2023-09-09 DIAGNOSIS — F5101 Primary insomnia: Secondary | ICD-10-CM

## 2023-09-09 NOTE — Patient Instructions (Signed)
Standing Labs We placed an order today for your standing lab work.   Please have your standing labs drawn in December and every 3 months  Please have your labs drawn 2 weeks prior to your appointment so that the provider can discuss your lab results at your appointment, if possible.  Please note that you may see your imaging and lab results in MyChart before we have reviewed them. We will contact you once all results are reviewed. Please allow our office up to 72 hours to thoroughly review all of the results before contacting the office for clarification of your results.  WALK-IN LAB HOURS  Monday through Thursday from 8:00 am -12:30 pm and 1:00 pm-5:00 pm and Friday from 8:00 am-12:00 pm.  Patients with office visits requiring labs will be seen before walk-in labs.  You may encounter longer than normal wait times. Please allow additional time. Wait times may be shorter on  Monday and Thursday afternoons.  We do not book appointments for walk-in labs. We appreciate your patience and understanding with our staff.   Labs are drawn by [facility]. Please bring your co-pay at the time of your lab draw.  You may receive a bill from [facility] for your lab work.  Please note if you are on Hydroxychloroquine and and an order has been placed for a Hydroxychloroquine level,  you will need to have it drawn 4 hours or more after your last dose.  If you wish to have your labs drawn at another location, please call the office 24 hours in advance so we can fax the orders.  The office is located at [address], [address], [place], [place] [address]   If you have any questions regarding directions or hours of operation,  please call [phone].   As a reminder, please drink plenty of water prior to coming for your lab work. Thanks!   Vaccines You are taking a medication(s) that can suppress your immune system.  The following immunizations are recommended: Flu annually Covid-19  RSV Td/Tdap (tetanus,  diphtheria, pertussis) every 10 years Pneumonia (Prevnar 15 then Pneumovax 23 at least 1 year apart.  Alternatively, can take Prevnar 20 without needing additional dose) Shingrix: 2 doses from 4 weeks to 6 months apart  Please check with your PCP to make sure you are up to date.  If you have signs or symptoms of an infection or start antibiotics: First, call your PCP for workup of your infection. Hold your medication through the infection, until you complete your antibiotics, and until symptoms resolve if you take the following: Injectable medication (Actemra, Benlysta, Cimzia, Cosentyx, Enbrel, Humira, Kevzara, Orencia, Remicade, Simponi, Stelara, Taltz, Tremfya) Methotrexate Leflunomide (Arava) Mycophenolate (Cellcept) Harriette Ohara, Olumiant, or Rinvoq

## 2023-09-10 LAB — BASIC METABOLIC PANEL WITH GFR
BUN: 19 mg/dL (ref 7–25)
CO2: 30 mmol/L (ref 20–32)
Calcium: 9.4 mg/dL (ref 8.6–10.4)
Chloride: 102 mmol/L (ref 98–110)
Creat: 0.99 mg/dL (ref 0.50–1.05)
Glucose, Bld: 107 mg/dL — ABNORMAL HIGH (ref 65–99)
Potassium: 4.2 mmol/L (ref 3.5–5.3)
Sodium: 142 mmol/L (ref 135–146)
eGFR: 62 mL/min/{1.73_m2} (ref >=60)

## 2023-09-10 NOTE — Progress Notes (Signed)
Creatinine is normal now.

## 2023-09-21 ENCOUNTER — Other Ambulatory Visit: Payer: Self-pay | Admitting: Physician Assistant

## 2023-09-21 NOTE — Telephone Encounter (Signed)
Last Fill: 09/17/2022  Next Visit: 12/15/2023  Last Visit: 09/09/2023  Dx: Other systemic lupus erythematosus with other organ involvement   Current Dose per office note on 09/09/2023: folic acid 1 mg po daily   Okay to refill Folic Acid?

## 2023-10-14 ENCOUNTER — Encounter: Payer: Self-pay | Admitting: *Deleted

## 2023-11-05 ENCOUNTER — Other Ambulatory Visit: Payer: Self-pay | Admitting: Physician Assistant

## 2023-11-05 NOTE — Telephone Encounter (Signed)
Last Fill: 09/08/2023 (30 day supply)  Labs: 08/24/2023 Glucose is 125.  Creatinine is elevated-1.19 and GFR is low-49. CBC WNL 09/09/2023 Creatinine is normal now.   Next Visit: 12/15/2023  Last Visit: 09/09/2023  DX: Other systemic lupus erythematosus with other organ involvement   Current Dose per office note 09/09/2023: MTX 0.3 ml sq once weekly   Okay to refill Methotrexate?

## 2023-11-23 ENCOUNTER — Ambulatory Visit: Payer: Medicare Other | Admitting: Physician Assistant

## 2023-12-15 ENCOUNTER — Ambulatory Visit: Payer: Medicare Other | Admitting: Physician Assistant

## 2023-12-15 ENCOUNTER — Ambulatory Visit: Payer: Medicare Other | Attending: Physician Assistant | Admitting: Physician Assistant

## 2023-12-15 ENCOUNTER — Encounter: Payer: Self-pay | Admitting: Physician Assistant

## 2023-12-15 ENCOUNTER — Other Ambulatory Visit: Payer: Self-pay | Admitting: *Deleted

## 2023-12-15 VITALS — BP 122/77 | HR 79 | Resp 14 | Ht 60.0 in | Wt 166.0 lb

## 2023-12-15 DIAGNOSIS — F5101 Primary insomnia: Secondary | ICD-10-CM

## 2023-12-15 DIAGNOSIS — Z79899 Other long term (current) drug therapy: Secondary | ICD-10-CM

## 2023-12-15 DIAGNOSIS — M797 Fibromyalgia: Secondary | ICD-10-CM

## 2023-12-15 DIAGNOSIS — H35389 Toxic maculopathy, unspecified eye: Secondary | ICD-10-CM

## 2023-12-15 DIAGNOSIS — Z8639 Personal history of other endocrine, nutritional and metabolic disease: Secondary | ICD-10-CM

## 2023-12-15 DIAGNOSIS — E559 Vitamin D deficiency, unspecified: Secondary | ICD-10-CM

## 2023-12-15 DIAGNOSIS — Z8719 Personal history of other diseases of the digestive system: Secondary | ICD-10-CM

## 2023-12-15 DIAGNOSIS — Z8659 Personal history of other mental and behavioral disorders: Secondary | ICD-10-CM

## 2023-12-15 DIAGNOSIS — L932 Other local lupus erythematosus: Secondary | ICD-10-CM

## 2023-12-15 DIAGNOSIS — G4733 Obstructive sleep apnea (adult) (pediatric): Secondary | ICD-10-CM

## 2023-12-15 DIAGNOSIS — M7061 Trochanteric bursitis, right hip: Secondary | ICD-10-CM

## 2023-12-15 DIAGNOSIS — M1712 Unilateral primary osteoarthritis, left knee: Secondary | ICD-10-CM

## 2023-12-15 DIAGNOSIS — M7062 Trochanteric bursitis, left hip: Secondary | ICD-10-CM

## 2023-12-15 DIAGNOSIS — M3219 Other organ or system involvement in systemic lupus erythematosus: Secondary | ICD-10-CM

## 2023-12-15 DIAGNOSIS — R5383 Other fatigue: Secondary | ICD-10-CM

## 2023-12-15 DIAGNOSIS — R7989 Other specified abnormal findings of blood chemistry: Secondary | ICD-10-CM | POA: Diagnosis not present

## 2023-12-15 DIAGNOSIS — M1711 Unilateral primary osteoarthritis, right knee: Secondary | ICD-10-CM

## 2023-12-15 DIAGNOSIS — S92352A Displaced fracture of fifth metatarsal bone, left foot, initial encounter for closed fracture: Secondary | ICD-10-CM

## 2023-12-15 DIAGNOSIS — T372X5A Adverse effect of antimalarials and drugs acting on other blood protozoa, initial encounter: Secondary | ICD-10-CM

## 2023-12-15 DIAGNOSIS — Z853 Personal history of malignant neoplasm of breast: Secondary | ICD-10-CM

## 2023-12-15 DIAGNOSIS — M8589 Other specified disorders of bone density and structure, multiple sites: Secondary | ICD-10-CM

## 2023-12-15 DIAGNOSIS — Z8679 Personal history of other diseases of the circulatory system: Secondary | ICD-10-CM

## 2023-12-15 NOTE — Progress Notes (Signed)
 Office Visit Note  Patient: Deborah Levy             Date of Birth: 1956-09-17           MRN: [id number]             PCP: Evangelina Tinnie Norris, PA-C Referring: Evangelina Tinnie Brier* Visit Date: 12/15/2023 Occupation: @GUAROCC @  Subjective:  Chronic pain   History of Present Illness: Deborah Levy is a 67 y.o. female with history of systemic lupus and cutaneous lupus. Patient remains on methotrexate  0.3 ml sq injections once weekly and folic acid  1 mg daily.  She is tolerating methotrexate  without any side effects or injection site reactions.  She denies any signs or symptoms of a systemic lupus or cutaneous lupus flare.  She has been avoiding direct sun exposure as advised and has not had any recent gaps in therapy.  She denies any oral or nasal ulcerations.  She continues to have chronic sicca symptoms which have been unchanged.  She has not been using any over-the-counter products for symptomatic relief.  Patient continues to have chronic pain involving her right knee as well as chronic pain from fibromyalgia which remain her primary concerns.  Patient remains on Lyrica and is taking tramadol as needed for pain relief.  Patient does not feel that her pain levels have been adequately controlled.  She is also been having difficulty sleeping at night and has had to increase her dose of melatonin.  Previously the patient found Ambien to be an effective for management of insomnia.    Activities of Daily Living:  Patient reports morning stiffness for 2 hours.   Patient Reports nocturnal pain.  Difficulty dressing/grooming: Denies Difficulty climbing stairs: Reports Difficulty getting out of chair: Denies Difficulty using hands for taps, buttons, cutlery, and/or writing: Denies  Review of Systems  Constitutional:  Positive for fatigue.  HENT:  Positive for mouth dryness. Negative for mouth sores.   Eyes:  Positive for dryness.  Respiratory:  Negative for shortness of  breath.   Cardiovascular:  Negative for chest pain and palpitations.  Gastrointestinal:  Positive for constipation. Negative for blood in stool and diarrhea.  Endocrine: Positive for increased urination.  Genitourinary:  Negative for involuntary urination.  Musculoskeletal:  Positive for joint pain, gait problem, joint pain, joint swelling, myalgias, morning stiffness and myalgias. Negative for muscle weakness and muscle tenderness.  Skin:  Positive for sensitivity to sunlight. Negative for color change, rash and hair loss.  Allergic/Immunologic: Negative for susceptible to infections.  Neurological:  Positive for headaches. Negative for dizziness.  Hematological:  Negative for swollen glands.  Psychiatric/Behavioral:  Positive for sleep disturbance. Negative for depressed mood. The patient is not nervous/anxious.     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   Type 2 diabetes mellitus (HCC) 11/22/2014   Chronic pain 11/13/2014   Fibromyalgia 11/13/2014   Rotator cuff impingement syndrome 11/13/2014   Cannot sleep 02/13/2014   Essential (primary) hypertension 09/07/2013   HLD (hyperlipidemia) 09/07/2013   Adiposity 09/07/2013   Systemic lupus erythematosus (HCC) 05/21/2013   Hypokalemia 04/21/2013   Breast cancer of lower-inner quadrant of left female breast (HCC)  01/12/2012    Past Medical History:  Diagnosis Date   Breast cancer (HCC)    Cancer (HCC)    Fibromyalgia    GERD (gastroesophageal reflux disease)    Hypertension    Lupus    Osteoarthritis     Family History  Problem Relation Age of Onset   Cancer Father        colon   Hypertension  Father    Heart disease Father    Cancer Sister        Cervical   Cancer Brother        prostate    Heart disease Maternal Grandfather    Hypertension Paternal Grandmother    Heart disease Paternal Grandmother    Breast cancer Neg Hx    Past Surgical History:  Procedure Laterality Date   BREAST LUMPECTOMY Left 2012   BREAST SURGERY  03/10/2011   Lt br lumpectomy   CATARACT EXTRACTION     08/2022 and 09/2022   COLONOSCOPY     Removed 2 polpys    KNEE SURGERY     SHOULDER SURGERY     TUBAL LIGATION     Social History   Social History Narrative   Not on file   Immunization History  Administered Date(s) Administered   PFIZER(Purple Top)SARS-COV-2 Vaccination 03/04/2020, 03/25/2020, 12/03/2020   Pfizer Covid-19 Vaccine Bivalent Booster 50yrs & up 09/07/2021     Objective: Vital Signs: BP 122/77 (BP Location: Right Arm, Patient Position: Sitting, Cuff Size: Normal)   Pulse 79   Resp 14   Ht 5' (1.524 m)   Wt 166 lb (75.3 kg)   BMI 32.42 kg/m    Physical Exam Vitals and nursing note reviewed.  Constitutional:      Appearance: She is well-developed.  HENT:     Head: Normocephalic and atraumatic.  Eyes:     Conjunctiva/sclera: Conjunctivae normal.  Cardiovascular:     Rate and Rhythm: Normal rate and regular rhythm.     Heart sounds: Normal heart sounds.  Pulmonary:     Effort: Pulmonary effort is normal.     Breath sounds: Normal breath sounds.  Abdominal:     General: Bowel sounds are normal.     Palpations: Abdomen is soft.  Musculoskeletal:     Cervical back: Normal range of motion.  Skin:    General: Skin is warm and dry.     Capillary Refill: Capillary refill takes less than 2 seconds.  Neurological:     Mental Status: She is alert and oriented to person, place, and time.  Psychiatric:        Behavior: Behavior normal.      Musculoskeletal Exam: C-spine has good range of motion.  Shoulder joints, elbow joints, wrist joints, MCPs, PIPs, DIPs have  good range of motion with no synovitis.  Complete fist formation bilaterally.  Hip joints have good range of motion with no groin pain.  Right knee has slightly limited extension with some warmth but no effusion.  Left knee joint has full range of motion with no warmth or effusion.  Ankle joints have good range of motion with no tenderness or joint swelling.  CDAI Exam: CDAI Score: -- Patient Global: --; Provider Global: -- Swollen: --; Tender: -- Joint Exam 12/15/2023   No joint exam has been documented for this visit   There is currently no information documented on the homunculus. Go to the Rheumatology activity and complete the homunculus joint exam.  Investigation: No additional findings.  Imaging:  No results found.  Recent Labs: Lab Results  Component Value Date   WBC 7.0 08/24/2023   HGB 11.9 08/24/2023   PLT 200 08/24/2023   NA 142 09/09/2023   K 4.2 09/09/2023   CL 102 09/09/2023   CO2 30 09/09/2023   GLUCOSE 107 (H) 09/09/2023   BUN 19 09/09/2023   CREATININE 0.99 09/09/2023   BILITOT 0.5 08/24/2023   ALKPHOS 86 07/01/2017   AST 30 08/24/2023   ALT 28 08/24/2023   PROT 6.2 08/24/2023   ALBUMIN 4.3 07/01/2017   CALCIUM 9.4 09/09/2023   GFRAA 99 02/26/2021    Speciality Comments: No specialty comments available.  Procedures:  No procedures performed Allergies: Sulfa antibiotics, Escitalopram, Metformin, and Sulfasalazine    Assessment / Plan:     Visit Diagnoses: Other systemic lupus erythematosus with other organ involvement (HCC) - Fatigue, arthralgia, malar rash, photosensitivity, positive subcutaneous lupus on biopsy, Positive ANA, Ro+ and La +:  She has not had any signs or symptoms of a systemic lupus or cutaneous lupus flare.  She has clinically been doing well on methotrexate  0.3 mL sq injections once weekly along with folic acid  1 mg daily.  She continues to tolerate methotrexate  without any side effects or injection site reactions.  She has not had  any recent or recurrent infections.  She has not had any recent oral or nasal ulcerations.  She continues to have chronic sicca symptoms so the use of over-the-counter products were discussed today in detail.  She has not had any symptoms of Raynaud's phenomenon.  No recent rashes.  Her primary concerns remain chronic pain involving the right knee from underlying osteoarthritis as well as chronic pain from fibromyalgia.  She is not yet ready to proceed with a knee replacement and has tried cortisone injections and viscosupplementation for the right knee in the past.  She has been taking Lyrica and tramadol as needed for pain relief for management of fibromyalgia. Vitamin D  50.5-WNL on 09/21/23.  Lab work from 08/24/2023 was reviewed today in the office: Sed rate within normal limits, complements within normal limits, double-stranded ENA negative, protein creatinine ratio within normal limits.  Plan to obtain the following lab work today for further evaluation.  She is advised to notify us  if she develops signs or symptoms of a flare.  She will remain on methotrexate  as prescribed.  She was advised to notify us  if she develops signs or symptoms of a flare.  She will follow-up in the office in 5 months or sooner if needed. Plan: Protein / creatinine ratio, urine, CBC with Differential/Platelet, COMPLETE METABOLIC PANEL WITH GFR, Anti-DNA antibody, double-stranded, C3 and C4, Sedimentation rate  High risk medication use - MTX 0.3 ml sq once weekly and folic acid  1 mg po daily. Reduced dose of MTX due to elevated LFTs.  BMP updated on 09/21/2023.  Orders for CBC and CMP were released today. No recent or recurrent infections.  Discussed the importance of holding methotrexate  if she develops signs or symptoms of an infection and to resume once the infection is completely cleared. Plan: CBC with Differential/Platelet, COMPLETE METABOLIC PANEL WITH GFR  Cutaneous lupus erythematosus: She has not had any signs or  symptoms of a cutaneous lupus rash.  She has been avoiding direct sun exposure as encouraged.  She remains on methotrexate  as prescribed.  She was advised to notify us  if she develops any signs of a recurrence.  Elevated serum creatinine: Creatinine was 0.99 and GFR was 62 on 09/21/2023.  CMP with GFR updated today.  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  Primary osteoarthritis of right knee: Chronic pain.  Limited extension with mild warmth.  Patient has tried both viscosupplementation and cortisone injections in the past with minimal to no improvement.  She is not ready to proceed with a knee replacement at this time.  Primary osteoarthritis of left knee: No warmth or effusion noted on examination today.  Osteopenia of multiple sites: April 10, 2021 DEXA done at [facility] showed a T-score of -1.3 in the left femoral neck.  Due to update DEXA.  Vitamin D  deficiency: Vitamin D  was within normal limits: 50.5 on 09/21/2023.  Closed fracture of base of fifth metatarsal bone of left foot  Fibromyalgia: Patient has generalized hyperalgesia and positive tender points on exam.  She continues to experience intermittent myalgias and muscle tenderness due to fibromyalgia.  She remains on Lyrica as prescribed and has been taking tramadol as needed for pain relief.  She is also been suffering from increased insomnia and has had to increase the dose of melatonin at bedtime.  Patient previously had an inadequate response to Ambien use.  Discussed the importance of regular exercise and good sleep hygiene.  Primary insomnia: Patient is been experiencing difficulty sleeping at night despite taking melatonin at bedtime.  Patient previously had an inadequate response to Ambien.  Discussed the possible use of trazodone.    Other fatigue: Chronic and secondary to insomnia.   Other medical conditions are listed as follows:  History of  hypertension: Blood pressure was 122/77 today in the office.  History of hyperlipidemia  History of breast cancer  History of diabetes mellitus  History of gastroesophageal reflux (GERD)  History of depression  OSA (obstructive sleep apnea)  Orders: Orders Placed This Encounter  Procedures   Protein / creatinine ratio, urine   CBC with Differential/Platelet   COMPLETE METABOLIC PANEL WITH GFR   Anti-DNA antibody, double-stranded   C3 and C4   Sedimentation rate   No orders of the defined types were placed in this encounter.    Follow-Up Instructions: Return in about 5 months (around 05/14/2024) for Systemic lupus erythematosus.   Waddell CHRISTELLA Craze, PA-C  Note - This record has been created using Dragon software.  Chart creation errors have been sought, but may not always  have been located. Such creation errors do not reflect on  the standard of medical care.

## 2023-12-15 NOTE — Addendum Note (Signed)
 Addended by: Henriette Combs on: 12/15/2023 02:56 PM   Modules accepted: Orders

## 2023-12-16 NOTE — Progress Notes (Signed)
 CBC and CMP WNL Complements WNL  Urine protein creatinine ratio WNL  ESR WNL

## 2023-12-17 LAB — ANTI-DNA ANTIBODY, DOUBLE-STRANDED: ds DNA Ab: 1 [IU]/mL

## 2023-12-17 LAB — CBC WITH DIFFERENTIAL/PLATELET
Absolute Lymphocytes: 1376 {cells}/uL (ref 850–3900)
Absolute Monocytes: 456 {cells}/uL (ref 200–950)
Basophils Absolute: 28 {cells}/uL (ref 0–200)
Basophils Relative: 0.7 %
Eosinophils Absolute: 60 {cells}/uL (ref 15–500)
Eosinophils Relative: 1.5 %
HCT: 38 % (ref 35.0–45.0)
Hemoglobin: 12.9 g/dL (ref 11.7–15.5)
MCH: 30.7 pg (ref 27.0–33.0)
MCHC: 33.9 g/dL (ref 32.0–36.0)
MCV: 90.5 fL (ref 80.0–100.0)
MPV: 10.3 fL (ref 7.5–12.5)
Monocytes Relative: 11.4 %
Neutro Abs: 2080 {cells}/uL (ref 1500–7800)
Neutrophils Relative %: 52 %
Platelets: 297 10*3/uL (ref 140–400)
RBC: 4.2 10*6/uL (ref 3.80–5.10)
RDW: 14 % (ref 11.0–15.0)
Total Lymphocyte: 34.4 %
WBC: 4 10*3/uL (ref 3.8–10.8)

## 2023-12-17 LAB — C3 AND C4
C3 Complement: 163 mg/dL (ref 83–193)
C4 Complement: 28 mg/dL (ref 15–57)

## 2023-12-17 LAB — COMPLETE METABOLIC PANEL WITH GFR
AG Ratio: 1.5 (calc) (ref 1.0–2.5)
ALT: 29 U/L (ref 6–29)
AST: 29 U/L (ref 10–35)
Albumin: 4.4 g/dL (ref 3.6–5.1)
Alkaline phosphatase (APISO): 93 U/L (ref 37–153)
BUN: 15 mg/dL (ref 7–25)
CO2: 30 mmol/L (ref 20–32)
Calcium: 9.5 mg/dL (ref 8.6–10.4)
Chloride: 101 mmol/L (ref 98–110)
Creat: 0.93 mg/dL (ref 0.50–1.05)
Globulin: 2.9 g/dL (ref 1.9–3.7)
Glucose, Bld: 82 mg/dL (ref 65–99)
Potassium: 4.4 mmol/L (ref 3.5–5.3)
Sodium: 141 mmol/L (ref 135–146)
Total Bilirubin: 0.4 mg/dL (ref 0.2–1.2)
Total Protein: 7.3 g/dL (ref 6.1–8.1)
eGFR: 67 mL/min/{1.73_m2} (ref >=60)

## 2023-12-17 LAB — PROTEIN / CREATININE RATIO, URINE
Creatinine, Urine: 132 mg/dL (ref 20–275)
Protein/Creat Ratio: 159 mg/g{creat} (ref 24–184)
Protein/Creatinine Ratio: 0.159 mg/mg{creat} (ref 0.024–0.184)
Total Protein, Urine: 21 mg/dL (ref 5–24)

## 2023-12-17 LAB — SEDIMENTATION RATE: Sed Rate: 22 mm/h (ref 0–30)

## 2023-12-18 NOTE — Progress Notes (Signed)
 dsDNA is negative. Labs are not consistent with a flare/active disease.

## 2023-12-28 ENCOUNTER — Ambulatory Visit: Payer: Medicare Other | Admitting: Physician Assistant

## 2024-02-10 ENCOUNTER — Other Ambulatory Visit: Payer: Self-pay | Admitting: Obstetrics and Gynecology

## 2024-02-10 DIAGNOSIS — N6320 Unspecified lump in the left breast, unspecified quadrant: Secondary | ICD-10-CM

## 2024-02-23 ENCOUNTER — Ambulatory Visit: Payer: Medicare Other | Admitting: Rheumatology

## 2024-03-04 ENCOUNTER — Other Ambulatory Visit: Payer: Self-pay | Admitting: Obstetrics and Gynecology

## 2024-03-04 DIAGNOSIS — N6320 Unspecified lump in the left breast, unspecified quadrant: Secondary | ICD-10-CM

## 2024-03-04 DIAGNOSIS — M7989 Other specified soft tissue disorders: Secondary | ICD-10-CM

## 2024-03-10 ENCOUNTER — Ambulatory Visit: Payer: Medicare Other

## 2024-03-10 ENCOUNTER — Ambulatory Visit
Admission: RE | Admit: 2024-03-10 | Discharge: 2024-03-10 | Disposition: A | Discharge status: Home / Self Care | Payer: Medicare Other | Source: Ambulatory Visit | Attending: Obstetrics and Gynecology | Admitting: Obstetrics and Gynecology

## 2024-03-10 DIAGNOSIS — N6320 Unspecified lump in the left breast, unspecified quadrant: Secondary | ICD-10-CM

## 2024-03-10 DIAGNOSIS — M7989 Other specified soft tissue disorders: Secondary | ICD-10-CM

## 2024-05-03 NOTE — Progress Notes (Deleted)
 Office Visit Note  Patient: Deborah Levy             Date of Birth: 04-22-1956           MRN: [id number]             PCP: Hampton Levins, PA-C Referring: Francois Isaacs* Visit Date: 05/12/2024 Occupation: @GUAROCC @  Subjective:  No chief complaint on file.   History of Present Illness: Kamisha Ell is a 68 y.o. female ***     Activities of Daily Living:  Patient reports morning stiffness for *** {minute/hour:19697}.   Patient {ACTIONS;DENIES/REPORTS:21021675::"Denies"} nocturnal pain.  Difficulty dressing/grooming: {ACTIONS;DENIES/REPORTS:21021675::"Denies"} Difficulty climbing stairs: {ACTIONS;DENIES/REPORTS:21021675::"Denies"} Difficulty getting out of chair: {ACTIONS;DENIES/REPORTS:21021675::"Denies"} Difficulty using hands for taps, buttons, cutlery, and/or writing: {ACTIONS;DENIES/REPORTS:21021675::"Denies"}  No Rheumatology ROS completed.   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   Type 2 diabetes mellitus (HCC) 11/22/2014   Chronic pain 11/13/2014   Fibromyalgia 11/13/2014   Rotator cuff impingement syndrome 11/13/2014   Cannot sleep 02/13/2014   Essential (primary) hypertension 09/07/2013   HLD (hyperlipidemia) 09/07/2013   Adiposity 09/07/2013   Systemic lupus erythematosus (HCC) 05/21/2013   Hypokalemia 04/21/2013   Breast cancer of lower-inner quadrant of left female breast (HCC) 01/12/2012    Past Medical History:  Diagnosis Date   Breast cancer (HCC)    Cancer (HCC)    Fibromyalgia    GERD (gastroesophageal reflux disease)    Hypertension    Lupus    Osteoarthritis     Family History   Problem Relation Age of Onset   Cancer Father        colon   Hypertension Father    Heart disease Father    Cancer Sister        Cervical   Cancer Brother        prostate    Heart disease Maternal Grandfather    Hypertension Paternal Grandmother    Heart disease Paternal Grandmother    Breast cancer Neg Hx    Past Surgical History:  Procedure Laterality Date   BREAST LUMPECTOMY Left 2012   BREAST SURGERY  03/10/2011   Lt br lumpectomy   CATARACT EXTRACTION     08/2022 and 09/2022   COLONOSCOPY     Removed 2 polpys    KNEE SURGERY     SHOULDER SURGERY     TUBAL LIGATION     Social History   Social History Narrative   Not on file   Immunization History  Administered Date(s) Administered   PFIZER(Purple Top)SARS-COV-2 Vaccination 03/04/2020, 03/25/2020, 12/03/2020   Pfizer Covid-19 Vaccine Bivalent Booster 90yrs & up 09/07/2021     Objective: Vital Signs: There were no vitals taken for this visit.   Physical Exam   Musculoskeletal Exam: ***  CDAI Exam: CDAI Score: -- Patient Global: --; Provider Global: -- Swollen: --; Tender: -- Joint Exam 05/12/2024   No joint exam has been documented for this visit   There is currently no information documented on the homunculus. Go to the Rheumatology activity and complete the homunculus joint exam.  Investigation: No additional findings.  Imaging: No results found.  Recent Labs: Lab Results  Component  Value Date   WBC 4.0 12/15/2023   HGB 12.9 12/15/2023   PLT 297 12/15/2023   NA 141 12/15/2023   K 4.4 12/15/2023   CL 101 12/15/2023   CO2 30 12/15/2023   GLUCOSE 82 12/15/2023   BUN 15 12/15/2023   CREATININE 0.93 12/15/2023   BILITOT 0.4 12/15/2023   ALKPHOS 86 07/01/2017   AST 29 12/15/2023   ALT 29 12/15/2023   PROT 7.3 12/15/2023   ALBUMIN 4.3 07/01/2017   CALCIUM 9.5 12/15/2023   GFRAA 99 02/26/2021    Speciality Comments: No specialty comments available.  Procedures:  No procedures  performed Allergies: Sulfa antibiotics, Escitalopram, Metformin, and Sulfasalazine   Assessment / Plan:     Visit Diagnoses: No diagnosis found.  Orders: No orders of the defined types were placed in this encounter.  No orders of the defined types were placed in this encounter.   Face-to-face time spent with patient was *** minutes. Greater than 50% of time was spent in counseling and coordination of care.  Follow-Up Instructions: No follow-ups on file.   Dee Farber, CMA  Note - This record has been created using Animal nutritionist.  Chart creation errors have been sought, but may not always  have been located. Such creation errors do not reflect on  the standard of medical care.

## 2024-05-06 ENCOUNTER — Other Ambulatory Visit: Payer: Self-pay | Admitting: Physician Assistant

## 2024-05-06 NOTE — Telephone Encounter (Signed)
 Last Fill: 11/05/2023  Labs: 12/15/2023 CBC and CMP WNL   Patient will update labs next week at appointment  Next Visit: 05/12/2024  Last Visit: 12/15/2023  DX: Other systemic lupus erythematosus with other organ involvement   Current Dose per office note 12/15/2023: methotrexate  0.3 mL sq injections once weekly   Okay to refill Methotrexate ?

## 2024-05-12 ENCOUNTER — Ambulatory Visit: Payer: Medicare Other | Admitting: Rheumatology

## 2024-05-12 DIAGNOSIS — M7061 Trochanteric bursitis, right hip: Secondary | ICD-10-CM

## 2024-05-12 DIAGNOSIS — G4733 Obstructive sleep apnea (adult) (pediatric): Secondary | ICD-10-CM

## 2024-05-12 DIAGNOSIS — Z8639 Personal history of other endocrine, nutritional and metabolic disease: Secondary | ICD-10-CM

## 2024-05-12 DIAGNOSIS — R5383 Other fatigue: Secondary | ICD-10-CM

## 2024-05-12 DIAGNOSIS — M797 Fibromyalgia: Secondary | ICD-10-CM

## 2024-05-12 DIAGNOSIS — M1712 Unilateral primary osteoarthritis, left knee: Secondary | ICD-10-CM

## 2024-05-12 DIAGNOSIS — E559 Vitamin D deficiency, unspecified: Secondary | ICD-10-CM

## 2024-05-12 DIAGNOSIS — Z8679 Personal history of other diseases of the circulatory system: Secondary | ICD-10-CM

## 2024-05-12 DIAGNOSIS — R7989 Other specified abnormal findings of blood chemistry: Secondary | ICD-10-CM

## 2024-05-12 DIAGNOSIS — M3219 Other organ or system involvement in systemic lupus erythematosus: Secondary | ICD-10-CM

## 2024-05-12 DIAGNOSIS — Z8659 Personal history of other mental and behavioral disorders: Secondary | ICD-10-CM

## 2024-05-12 DIAGNOSIS — S92352A Displaced fracture of fifth metatarsal bone, left foot, initial encounter for closed fracture: Secondary | ICD-10-CM

## 2024-05-12 DIAGNOSIS — Z79899 Other long term (current) drug therapy: Secondary | ICD-10-CM

## 2024-05-12 DIAGNOSIS — Z8719 Personal history of other diseases of the digestive system: Secondary | ICD-10-CM

## 2024-05-12 DIAGNOSIS — L932 Other local lupus erythematosus: Secondary | ICD-10-CM

## 2024-05-12 DIAGNOSIS — M8589 Other specified disorders of bone density and structure, multiple sites: Secondary | ICD-10-CM

## 2024-05-12 DIAGNOSIS — F5101 Primary insomnia: Secondary | ICD-10-CM

## 2024-05-12 DIAGNOSIS — Z853 Personal history of malignant neoplasm of breast: Secondary | ICD-10-CM

## 2024-05-12 DIAGNOSIS — M1711 Unilateral primary osteoarthritis, right knee: Secondary | ICD-10-CM

## 2024-05-12 DIAGNOSIS — H35389 Toxic maculopathy, unspecified eye: Secondary | ICD-10-CM

## 2024-05-18 NOTE — Progress Notes (Deleted)
 Office Visit Note  Patient: Deborah Levy             Date of Birth: [birth year]           MRN: [id number]             PCP: Hampton Levins, PA-C Referring: Francois Isaacs* Visit Date: 05/24/2024 Occupation: @GUAROCC @  Subjective:    History of Present Illness: Nneka Blanda is a 68 y.o. female with history of systemic lupus.  Patient remains on MTX 0.3 ml sq once weekly and folic acid  1 mg po daily.   CBC and CMP updated on 12/15/23.  Orders for CBC and CMP released today.  Discussed the importance of holding methotrexate  if she develops signs or symptoms of an infection and to resume once the infection has completely cleared.    Activities of Daily Living:  Patient reports morning stiffness for *** {minute/hour:19697}.   Patient {ACTIONS;DENIES/REPORTS:21021675::"Denies"} nocturnal pain.  Difficulty dressing/grooming: {ACTIONS;DENIES/REPORTS:21021675::"Denies"} Difficulty climbing stairs: {ACTIONS;DENIES/REPORTS:21021675::"Denies"} Difficulty getting out of chair: {ACTIONS;DENIES/REPORTS:21021675::"Denies"} Difficulty using hands for taps, buttons, cutlery, and/or writing: {ACTIONS;DENIES/REPORTS:21021675::"Denies"}  No Rheumatology ROS completed.   PMFS History:  Patient Active Problem List   Diagnosis Date Noted  . 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 History of gastroesophageal reflux (GERD) 07/01/2017  . Other fatigue 03/23/2017  . Primary insomnia 03/23/2017  . History of vitamin D  deficiency 03/23/2017  . High risk medication use 10/18/2016  . 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 Type 2 diabetes mellitus (HCC) 11/22/2014  . Chronic pain 11/13/2014  . Fibromyalgia 11/13/2014  . Rotator cuff impingement syndrome 11/13/2014  . Cannot sleep 02/13/2014  . Essential (primary) hypertension 09/07/2013  . HLD (hyperlipidemia) 09/07/2013  . Adiposity 09/07/2013  . Systemic lupus erythematosus (HCC)  05/21/2013  . Hypokalemia 04/21/2013  . Breast cancer of lower-inner quadrant of left female breast (HCC) 01/12/2012    Past Medical History:  Diagnosis Date  . Breast cancer (HCC)   . Cancer (HCC)   . Fibromyalgia   . GERD (gastroesophageal reflux disease)   . Hypertension   . Lupus   . Osteoarthritis     Family History  Problem Relation Age of Onset  . Cancer Father        colon  . Hypertension Father   . Heart disease Father   . Cancer Sister        Cervical  . Cancer Brother        prostate   . Heart disease Maternal Grandfather   . Hypertension Paternal Grandmother   . Heart disease Paternal Grandmother   . Breast cancer Neg Hx    Past Surgical History:  Procedure Laterality Date  . BREAST LUMPECTOMY Left 2012  . BREAST SURGERY  03/10/2011   Lt br lumpectomy  . CATARACT EXTRACTION     08/2022 and 09/2022  . COLONOSCOPY     Removed 2 polpys   . KNEE SURGERY    . SHOULDER SURGERY    . TUBAL LIGATION     Social History   Social History Narrative  . Not on file   Immunization History  Administered Date(s) Administered  . PFIZER(Purple Top)SARS-COV-2 Vaccination 03/04/2020, 03/25/2020, 12/03/2020  . Pfizer Covid-19 Vaccine Bivalent Booster 74yrs & up 09/07/2021     Objective: Vital Signs: There were no vitals taken for this visit.   Physical Exam Vitals and nursing note reviewed.  Constitutional:      Appearance: She is well-developed.  HENT:  Head: Normocephalic and atraumatic.  Eyes:     Conjunctiva/sclera: Conjunctivae normal.  Cardiovascular:     Rate and Rhythm: Normal rate and regular rhythm.     Heart sounds: Normal heart sounds.  Pulmonary:     Effort: Pulmonary effort is normal.     Breath sounds: Normal breath sounds.  Abdominal:     General: Bowel sounds are normal.     Palpations: Abdomen is soft.  Musculoskeletal:     Cervical back: Normal range of motion.  Lymphadenopathy:     Cervical: No cervical adenopathy.  Skin:     General: Skin is warm and dry.     Capillary Refill: Capillary refill takes less than 2 seconds.  Neurological:     Mental Status: She is alert and oriented to person, place, and time.  Psychiatric:        Behavior: Behavior normal.     Musculoskeletal Exam: ***  CDAI Exam: CDAI Score: -- Patient Global: --; Provider Global: -- Swollen: --; Tender: -- Joint Exam 05/24/2024   No joint exam has been documented for this visit   There is currently no information documented on the homunculus. Go to the Rheumatology activity and complete the homunculus joint exam.  Investigation: No additional findings.  Imaging: No results found.  Recent Labs: Lab Results  Component Value Date   WBC 4.0 12/15/2023   HGB 12.9 12/15/2023   PLT 297 12/15/2023   NA 141 12/15/2023   K 4.4 12/15/2023   CL 101 12/15/2023   CO2 30 12/15/2023   GLUCOSE 82 12/15/2023   BUN 15 12/15/2023   CREATININE 0.93 12/15/2023   BILITOT 0.4 12/15/2023   ALKPHOS 86 07/01/2017   AST 29 12/15/2023   ALT 29 12/15/2023   PROT 7.3 12/15/2023   ALBUMIN 4.3 07/01/2017   CALCIUM 9.5 12/15/2023   GFRAA 99 02/26/2021    Speciality Comments: No specialty comments available.  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  No orders of the defined types were placed in this encounter.   Face-to-face time spent with patient was *** minutes. Greater than 50% of time was spent in counseling and coordination of care.  Follow-Up Instructions: No follow-ups on file.   Romayne Clubs, PA-C  Note - This record has been created using Dragon software.  Chart creation errors have been sought, but may not always  have been located. Such creation errors do not reflect on  the standard of medical care.

## 2024-05-24 ENCOUNTER — Ambulatory Visit: Admitting: Physician Assistant

## 2024-05-24 DIAGNOSIS — M1711 Unilateral primary osteoarthritis, right knee: Secondary | ICD-10-CM

## 2024-05-24 DIAGNOSIS — E559 Vitamin D deficiency, unspecified: Secondary | ICD-10-CM

## 2024-05-24 DIAGNOSIS — Z8659 Personal history of other mental and behavioral disorders: Secondary | ICD-10-CM

## 2024-05-24 DIAGNOSIS — M8589 Other specified disorders of bone density and structure, multiple sites: Secondary | ICD-10-CM

## 2024-05-24 DIAGNOSIS — Z8679 Personal history of other diseases of the circulatory system: Secondary | ICD-10-CM

## 2024-05-24 DIAGNOSIS — M7061 Trochanteric bursitis, right hip: Secondary | ICD-10-CM

## 2024-05-24 DIAGNOSIS — Z853 Personal history of malignant neoplasm of breast: Secondary | ICD-10-CM

## 2024-05-24 DIAGNOSIS — G4733 Obstructive sleep apnea (adult) (pediatric): Secondary | ICD-10-CM

## 2024-05-24 DIAGNOSIS — M797 Fibromyalgia: Secondary | ICD-10-CM

## 2024-05-24 DIAGNOSIS — F5101 Primary insomnia: Secondary | ICD-10-CM

## 2024-05-24 DIAGNOSIS — Z8719 Personal history of other diseases of the digestive system: Secondary | ICD-10-CM

## 2024-05-24 DIAGNOSIS — R5383 Other fatigue: Secondary | ICD-10-CM

## 2024-05-24 DIAGNOSIS — Z79899 Other long term (current) drug therapy: Secondary | ICD-10-CM

## 2024-05-24 DIAGNOSIS — M1712 Unilateral primary osteoarthritis, left knee: Secondary | ICD-10-CM

## 2024-05-24 DIAGNOSIS — R7989 Other specified abnormal findings of blood chemistry: Secondary | ICD-10-CM

## 2024-05-24 DIAGNOSIS — L932 Other local lupus erythematosus: Secondary | ICD-10-CM

## 2024-05-24 DIAGNOSIS — H35389 Toxic maculopathy, unspecified eye: Secondary | ICD-10-CM

## 2024-05-24 DIAGNOSIS — S92352A Displaced fracture of fifth metatarsal bone, left foot, initial encounter for closed fracture: Secondary | ICD-10-CM

## 2024-05-24 DIAGNOSIS — Z8639 Personal history of other endocrine, nutritional and metabolic disease: Secondary | ICD-10-CM

## 2024-05-24 DIAGNOSIS — M3219 Other organ or system involvement in systemic lupus erythematosus: Secondary | ICD-10-CM

## 2024-06-03 NOTE — Progress Notes (Unsigned)
 Office Visit Note  Patient: Deborah Levy             Date of Birth: [birth year]           MRN: [id number]             PCP: Hampton Levins, PA-C Referring: Francois Isaacs* Visit Date: 06/06/2024 Occupation: @GUAROCC @  Subjective:    History of Present Illness: Bailie Christenbury is a 68 y.o. female with history of systemic lupus.  Patient remains on MTX 0.3 ml sq once weekly and folic acid  1 mg po daily.   CBC and CMP updated on 12/15/23.  Orders for CBC and CMP released today.  Discussed the importance of holding methotrexate  if she develops signs or symptoms of an infection and to resume once the infection has completely cleared.    Activities of Daily Living:  Patient reports morning stiffness for *** {minute/hour:19697}.   Patient {ACTIONS;DENIES/REPORTS:21021675::Denies} nocturnal pain.  Difficulty dressing/grooming: {ACTIONS;DENIES/REPORTS:21021675::Denies} Difficulty climbing stairs: {ACTIONS;DENIES/REPORTS:21021675::Denies} Difficulty getting out of chair: {ACTIONS;DENIES/REPORTS:21021675::Denies} Difficulty using hands for taps, buttons, cutlery, and/or writing: {ACTIONS;DENIES/REPORTS:21021675::Denies}  No Rheumatology ROS completed.   PMFS History:  Patient Active Problem List   Diagnosis Date Noted  . 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 History of gastroesophageal reflux (GERD) 07/01/2017  . Other fatigue 03/23/2017  . Primary insomnia 03/23/2017  . History of vitamin D  deficiency 03/23/2017  . High risk medication use 10/18/2016  . 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 Type 2 diabetes mellitus (HCC) 11/22/2014  . Chronic pain 11/13/2014  . Fibromyalgia 11/13/2014  . Rotator cuff impingement syndrome 11/13/2014  . Cannot sleep 02/13/2014  . Essential (primary) hypertension 09/07/2013  . HLD (hyperlipidemia) 09/07/2013  . Adiposity 09/07/2013  . Systemic lupus erythematosus (HCC)  05/21/2013  . Hypokalemia 04/21/2013  . Breast cancer of lower-inner quadrant of left female breast (HCC) 01/12/2012    Past Medical History:  Diagnosis Date  . Breast cancer (HCC)   . Cancer (HCC)   . Fibromyalgia   . GERD (gastroesophageal reflux disease)   . Hypertension   . Lupus   . Osteoarthritis     Family History  Problem Relation Age of Onset  . Cancer Father        colon  . Hypertension Father   . Heart disease Father   . Cancer Sister        Cervical  . Cancer Brother        prostate   . Heart disease Maternal Grandfather   . Hypertension Paternal Grandmother   . Heart disease Paternal Grandmother   . Breast cancer Neg Hx    Past Surgical History:  Procedure Laterality Date  . BREAST LUMPECTOMY Left 2012  . BREAST SURGERY  03/10/2011   Lt br lumpectomy  . CATARACT EXTRACTION     08/2022 and 09/2022  . COLONOSCOPY     Removed 2 polpys   . KNEE SURGERY    . SHOULDER SURGERY    . TUBAL LIGATION     Social History   Social History Narrative  . Not on file   Immunization History  Administered Date(s) Administered  . PFIZER(Purple Top)SARS-COV-2 Vaccination 03/04/2020, 03/25/2020, 12/03/2020  . Pfizer Covid-19 Vaccine Bivalent Booster 27yrs & up 09/07/2021     Objective: Vital Signs: There were no vitals taken for this visit.   Physical Exam Vitals and nursing note reviewed.  Constitutional:      Appearance: She is well-developed.  HENT:  Head: Normocephalic and atraumatic.   Eyes:     Conjunctiva/sclera: Conjunctivae normal.    Cardiovascular:     Rate and Rhythm: Normal rate and regular rhythm.     Heart sounds: Normal heart sounds.  Pulmonary:     Effort: Pulmonary effort is normal.     Breath sounds: Normal breath sounds.  Abdominal:     General: Bowel sounds are normal.     Palpations: Abdomen is soft.   Musculoskeletal:     Cervical back: Normal range of motion.  Lymphadenopathy:     Cervical: No cervical adenopathy.    Skin:    General: Skin is warm and dry.     Capillary Refill: Capillary refill takes less than 2 seconds.   Neurological:     Mental Status: She is alert and oriented to person, place, and time.   Psychiatric:        Behavior: Behavior normal.     Musculoskeletal Exam: ***  CDAI Exam: CDAI Score: -- Patient Global: --; Provider Global: -- Swollen: --; Tender: -- Joint Exam 06/06/2024   No joint exam has been documented for this visit   There is currently no information documented on the homunculus. Go to the Rheumatology activity and complete the homunculus joint exam.  Investigation: No additional findings.  Imaging: No results found.  Recent Labs: Lab Results  Component Value Date   WBC 4.0 12/15/2023   HGB 12.9 12/15/2023   PLT 297 12/15/2023   NA 141 12/15/2023   K 4.4 12/15/2023   CL 101 12/15/2023   CO2 30 12/15/2023   GLUCOSE 82 12/15/2023   BUN 15 12/15/2023   CREATININE 0.93 12/15/2023   BILITOT 0.4 12/15/2023   ALKPHOS 86 07/01/2017   AST 29 12/15/2023   ALT 29 12/15/2023   PROT 7.3 12/15/2023   ALBUMIN 4.3 07/01/2017   CALCIUM 9.5 12/15/2023   GFRAA 99 02/26/2021    Speciality Comments: No specialty comments available.  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  No orders of the defined types were placed in this encounter.   Face-to-face time spent with patient was *** minutes. Greater than 50% of time was spent in counseling and coordination of care.  Follow-Up Instructions: No follow-ups on file.   Romayne Clubs, PA-C  Note - This record has been created using Dragon software.  Chart creation errors have been sought, but may not always  have been located. Such creation errors do not reflect on  the standard of medical care.

## 2024-06-06 ENCOUNTER — Encounter: Payer: Self-pay | Admitting: Physician Assistant

## 2024-06-06 ENCOUNTER — Ambulatory Visit: Attending: Physician Assistant | Admitting: Physician Assistant

## 2024-06-06 ENCOUNTER — Other Ambulatory Visit: Payer: Self-pay | Admitting: Physician Assistant

## 2024-06-06 VITALS — BP 108/73 | HR 73 | Resp 16 | Ht 59.0 in | Wt 160.6 lb

## 2024-06-06 DIAGNOSIS — M1711 Unilateral primary osteoarthritis, right knee: Secondary | ICD-10-CM

## 2024-06-06 DIAGNOSIS — F5101 Primary insomnia: Secondary | ICD-10-CM

## 2024-06-06 DIAGNOSIS — R7989 Other specified abnormal findings of blood chemistry: Secondary | ICD-10-CM

## 2024-06-06 DIAGNOSIS — M3219 Other organ or system involvement in systemic lupus erythematosus: Secondary | ICD-10-CM | POA: Diagnosis not present

## 2024-06-06 DIAGNOSIS — M7062 Trochanteric bursitis, left hip: Secondary | ICD-10-CM

## 2024-06-06 DIAGNOSIS — E559 Vitamin D deficiency, unspecified: Secondary | ICD-10-CM

## 2024-06-06 DIAGNOSIS — H35389 Toxic maculopathy, unspecified eye: Secondary | ICD-10-CM | POA: Diagnosis not present

## 2024-06-06 DIAGNOSIS — Z8659 Personal history of other mental and behavioral disorders: Secondary | ICD-10-CM

## 2024-06-06 DIAGNOSIS — R5383 Other fatigue: Secondary | ICD-10-CM

## 2024-06-06 DIAGNOSIS — Z8679 Personal history of other diseases of the circulatory system: Secondary | ICD-10-CM

## 2024-06-06 DIAGNOSIS — Z8719 Personal history of other diseases of the digestive system: Secondary | ICD-10-CM

## 2024-06-06 DIAGNOSIS — S92352A Displaced fracture of fifth metatarsal bone, left foot, initial encounter for closed fracture: Secondary | ICD-10-CM

## 2024-06-06 DIAGNOSIS — T372X5A Adverse effect of antimalarials and drugs acting on other blood protozoa, initial encounter: Secondary | ICD-10-CM

## 2024-06-06 DIAGNOSIS — Z79899 Other long term (current) drug therapy: Secondary | ICD-10-CM | POA: Diagnosis not present

## 2024-06-06 DIAGNOSIS — M1712 Unilateral primary osteoarthritis, left knee: Secondary | ICD-10-CM

## 2024-06-06 DIAGNOSIS — L932 Other local lupus erythematosus: Secondary | ICD-10-CM

## 2024-06-06 DIAGNOSIS — M7061 Trochanteric bursitis, right hip: Secondary | ICD-10-CM

## 2024-06-06 DIAGNOSIS — G4733 Obstructive sleep apnea (adult) (pediatric): Secondary | ICD-10-CM

## 2024-06-06 DIAGNOSIS — M8589 Other specified disorders of bone density and structure, multiple sites: Secondary | ICD-10-CM

## 2024-06-06 DIAGNOSIS — Z853 Personal history of malignant neoplasm of breast: Secondary | ICD-10-CM

## 2024-06-06 DIAGNOSIS — Z8639 Personal history of other endocrine, nutritional and metabolic disease: Secondary | ICD-10-CM

## 2024-06-07 ENCOUNTER — Ambulatory Visit: Payer: Self-pay | Admitting: Physician Assistant

## 2024-06-07 DIAGNOSIS — R808 Other proteinuria: Secondary | ICD-10-CM

## 2024-06-07 NOTE — Progress Notes (Signed)
 Urine protein creatinine ratio is borderline elevated.  Recheck in 1 month. CBC WNL ESR WNL Complements WNL

## 2024-06-08 LAB — PROTEIN / CREATININE RATIO, URINE
Creatinine, Urine: 74 mg/dL (ref 20–275)
Protein/Creat Ratio: 257 mg/g{creat} — ABNORMAL HIGH (ref 24–184)
Protein/Creatinine Ratio: 0.257 mg/mg{creat} — ABNORMAL HIGH (ref 0.024–0.184)
Total Protein, Urine: 19 mg/dL (ref 5–24)

## 2024-06-08 LAB — CBC WITH DIFFERENTIAL/PLATELET
Absolute Lymphocytes: 986 {cells}/uL (ref 850–3900)
Absolute Monocytes: 546 {cells}/uL (ref 200–950)
Basophils Absolute: 11 {cells}/uL (ref 0–200)
Basophils Relative: 0.2 %
Eosinophils Absolute: 419 {cells}/uL (ref 15–500)
Eosinophils Relative: 7.9 %
HCT: 38.6 % (ref 35.0–45.0)
Hemoglobin: 12.7 g/dL (ref 11.7–15.5)
MCH: 31 pg (ref 27.0–33.0)
MCHC: 32.9 g/dL (ref 32.0–36.0)
MCV: 94.1 fL (ref 80.0–100.0)
MPV: 10.4 fL (ref 7.5–12.5)
Monocytes Relative: 10.3 %
Neutro Abs: 3339 {cells}/uL (ref 1500–7800)
Neutrophils Relative %: 63 %
Platelets: 271 10*3/uL (ref 140–400)
RBC: 4.1 10*6/uL (ref 3.80–5.10)
RDW: 13.7 % (ref 11.0–15.0)
Total Lymphocyte: 18.6 %
WBC: 5.3 10*3/uL (ref 3.8–10.8)

## 2024-06-08 LAB — ANTI-DNA ANTIBODY, DOUBLE-STRANDED: ds DNA Ab: 1 [IU]/mL

## 2024-06-08 LAB — SEDIMENTATION RATE: Sed Rate: 17 mm/h (ref 0–30)

## 2024-06-08 LAB — ANA: Anti Nuclear Antibody (ANA): POSITIVE — AB

## 2024-06-08 LAB — ANTI-NUCLEAR AB-TITER (ANA TITER)
ANA TITER: 1:80 {titer} — ABNORMAL HIGH
ANA Titer 1: 1:320 {titer} — ABNORMAL HIGH

## 2024-06-08 LAB — C3 AND C4
C3 Complement: 163 mg/dL (ref 83–193)
C4 Complement: 28 mg/dL (ref 15–57)

## 2024-06-08 NOTE — Progress Notes (Signed)
dsDNA is negative.  Labs are not consistent with a flare.

## 2024-06-08 NOTE — Progress Notes (Signed)
 Her ANA has consistently been positive and may never go negative.  The ANA does not correlate with disease activity and will not reveal a flare.  Her other labs are reassuring and do not show a flare.  No change in therapy recommended at this time

## 2024-06-08 NOTE — Progress Notes (Signed)
 ANA remains positive.

## 2024-07-12 ENCOUNTER — Other Ambulatory Visit: Payer: Self-pay

## 2024-07-12 DIAGNOSIS — R808 Other proteinuria: Secondary | ICD-10-CM

## 2024-07-13 ENCOUNTER — Ambulatory Visit: Payer: Self-pay | Admitting: Physician Assistant

## 2024-07-13 DIAGNOSIS — R808 Other proteinuria: Secondary | ICD-10-CM

## 2024-07-13 DIAGNOSIS — M3219 Other organ or system involvement in systemic lupus erythematosus: Secondary | ICD-10-CM

## 2024-07-13 LAB — PROTEIN / CREATININE RATIO, URINE
Creatinine, Urine: 80 mg/dL (ref 20–275)
Protein/Creat Ratio: 250 mg/g{creat} — ABNORMAL HIGH (ref 24–184)
Protein/Creatinine Ratio: 0.25 mg/mg{creat} — ABNORMAL HIGH (ref 0.024–0.184)
Total Protein, Urine: 20 mg/dL (ref 5–24)

## 2024-07-13 NOTE — Progress Notes (Signed)
 Urine protein creatinine ratio remains slightly elevated-ok to place referral to nephrology.

## 2024-08-01 ENCOUNTER — Ambulatory Visit
Admission: EM | Admit: 2024-08-01 | Discharge: 2024-08-01 | Disposition: A | Discharge status: Home / Self Care | Attending: Nurse Practitioner | Admitting: Nurse Practitioner

## 2024-08-01 ENCOUNTER — Encounter: Payer: Self-pay | Admitting: Emergency Medicine

## 2024-08-01 DIAGNOSIS — H6123 Impacted cerumen, bilateral: Secondary | ICD-10-CM | POA: Diagnosis not present

## 2024-08-01 NOTE — ED Triage Notes (Signed)
 Pt st's she had someone look in her ears earlier today and was told she has wax in both ears  Pt requesting to have wax removed

## 2024-08-01 NOTE — Discharge Instructions (Signed)
 You were seen today earwax buildup. Earwax actually helps protect your ears by keeping bacteria away and preventing damage to the skin inside your ears. However, when too much earwax accumulates, it can cause discomfort and make it harder to hear.You had your ear irrigated today, which has helped improve your symptoms. Be sure to use the ear drops that were prescribed to you. It's important not to put anything else in your ear, including Q-tips or cotton balls, as this can push the wax deeper or cause irritation. Also, try to keep water out of your ear, especially when showering or swimming. If you have any further concerns or if your symptoms persist, please follow up with your primary care provider.

## 2024-08-01 NOTE — ED Provider Notes (Signed)
 [facility]    CSN: [id number] Arrival date & time: 08/01/24  1504      History   Chief Complaint Chief Complaint  Patient presents with   Needs ear wax removed    HPI Deborah Levy is a 68 y.o. female.   Patient presents for earwax removal after accompanying her 66 year old mother to an audiology appointment earlier today. During her mother's evaluation, the audiologist noted cerumen buildup in the patient's ears as well and advised removal at urgent care. The patient denies any associated symptoms, including ear pain, decreased hearing, drainage, or dizziness, and is here for cerumen removal along with her mother.  The following portions of the patient's history were reviewed and updated as appropriate: allergies, current medications, past family history, past medical history, past social history, past surgical history, and problem list.    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   Type 2 diabetes mellitus (HCC) 11/22/2014   Chronic pain 11/13/2014   Fibromyalgia 11/13/2014   Rotator cuff impingement syndrome 11/13/2014   Cannot sleep 02/13/2014   Essential (primary) hypertension 09/07/2013   HLD (hyperlipidemia) 09/07/2013   Adiposity 09/07/2013   Systemic lupus erythematosus (HCC) 05/21/2013   Hypokalemia 04/21/2013   Breast cancer of lower-inner quadrant of left female breast (HCC) 01/12/2012    Past Surgical History:  Procedure Laterality Date    BREAST LUMPECTOMY Left 2012   BREAST SURGERY  03/10/2011   Lt br lumpectomy   CATARACT EXTRACTION     08/2022 and 09/2022   COLONOSCOPY     Removed 2 polpys    KNEE SURGERY     SHOULDER SURGERY     TUBAL LIGATION      OB History   No obstetric history on file.      Home Medications    Prior to Admission medications   Medication Sig Start Date End Date Taking? Authorizing Provider  Accu-Chek Softclix Lancets lancets USE TO CHECK BLOOD SUGARS UP TO 2 TIMES DAILY. DIAGNOSIS CODE: E11.9    [provider]  aspirin 81 MG chewable tablet Chew by mouth.    [provider]  atorvastatin (LIPITOR) 20 MG tablet  06/29/19   [provider]  Biotin 5000 MCG CAPS Take 2 capsules by mouth daily.    [provider]  Blood Glucose Monitoring Suppl (ACCU-CHEK GUIDE) w/Device KIT USE TO CHECK BLOOD SUGARS UP TO 2 TIMES DAILY. DIAGNOSIS CODE: E11.9    [provider]  buprenorphine (SUBUTEX) 2 MG SUBL SL tablet Place 2 mg under the tongue daily. Patient not taking: Reported on 06/06/2024    [provider]  CHROMIUM PICOLINATE PO Take by mouth daily.    [provider]  Coenzyme Q10 (CO Q-10 PO) Take by mouth.    [provider]  Cyanocobalamin (B-12 PO) Take by mouth.    [provider]  diclofenac  Sodium (VOLTAREN ) 1 % GEL Apply topically. 05/29/20  [provider]  Docusate Calcium (STOOL SOFTENER PO) Take by mouth daily.    [provider]  estradiol (ESTRACE) 0.1 MG/GM vaginal cream as needed.    [provider]  fluticasone (CUTIVATE) 0.05 % cream Apply topically as needed. 06/20/21   [provider]  folic acid  (FOLVITE ) 1 MG tablet TAKE 1 TABLET BY MOUTH EVERY DAY 09/21/23   Cheryl Waddell HERO, PA-C  glucose blood (ACCU-CHEK GUIDE TEST) test strip USE TO CHECK BLOOD SUGARS UP TO 2 TIMES DAILY. DIAGNOSIS CODE: E11.9    [provider]  hydrochlorothiazide (HYDRODIURIL) 25 MG  tablet TAKE 1 TABLET BY MOUTH EVERY DAY Patient not taking: Reported on 06/06/2024 08/29/19   [provider]  hydrochlorothiazide (MICROZIDE) 12.5 MG capsule Take 1 tablet by mouth daily.    [provider]  JARDIANCE 10 MG TABS tablet Take 1 tablet by mouth daily. 01/25/24   [provider]  lisinopril (ZESTRIL) 10 MG tablet Take 10 mg by mouth daily. 09/20/20   [provider]  LYRICA 300 MG capsule Take 300 mg by mouth 2 (two) times daily.  06/04/17   [provider]  magic mouthwash SOLN Take 5 mLs by mouth 4 (four) times daily. Patient not taking: Reported on 06/06/2024 02/04/22   Cheryl Waddell HERO, PA-C  magnesium gluconate (MAGONATE) 500 MG tablet Take 400 mg by mouth daily. Patient not taking: Reported on 06/06/2024    [provider]  meclizine  (ANTIVERT ) 25 MG tablet Take 25 mg by mouth 3 (three) times daily as needed. 11/21/19   [provider]  Melatonin 10 MG TABS Take 20 mg by mouth.    [provider]  methocarbamol (ROBAXIN) 500 MG tablet Take 500 mg by mouth 2 (two) times daily as needed. 06/15/19   [provider]  methotrexate  50 MG/2ML injection INJECT 0.3 MLS (7.5 MG TOTAL) INTO THE SKIN ONCE A WEEK. 05/06/24   Cheryl Waddell HERO, PA-C  MOUNJARO 5 MG/0.5ML Pen INJECT 5 MG SUBCUTANEOUSLY EVERY 7 DAYS    [provider]  MOUNJARO 7.5 MG/0.5ML Pen Getting ready to go up on the dose Patient not taking: Reported on 06/06/2024    [provider]  Multiple Vitamins-Minerals (MULTIVITAMIN ADULT PO) Take by mouth daily.    [provider]  omeprazole (PRILOSEC) 40 MG capsule Take 40 mg by mouth daily. 01/24/12   [provider]  ondansetron  (ZOFRAN ) 4 MG tablet TAKE 1 TABLET BY MOUTH EVERY 8 HOURS AS NEEDED FOR NAUSEA 07/29/23   Dolphus Reiter, MD  oxybutynin (DITROPAN-XL) 5 MG 24 hr tablet Take 5 mg by mouth daily. 01/16/21   [provider]  pantoprazole (PROTONIX) 40 MG tablet  Take 40 mg by mouth daily.    [provider]  rOPINIRole (REQUIP) 2 MG tablet Take 2 mg by mouth at bedtime.    [provider]  traMADol (ULTRAM) 50 MG tablet Take 100 mg by mouth as needed. 07/04/15   Magrinat, Sandria BROCKS, MD  TUBERCULIN SYR 1CC/27GX1/2 (B-D TB SYRINGE 1CC/27GX1/2) 27G X 1/2 1 ML MISC 12 Syringes by Does not apply route once a week. Patient to use to inject Methotrexate  05/11/20   Dolphus Reiter, MD  TURMERIC PO Take by mouth daily.    [provider]  zaleplon (SONATA) 10 MG capsule Take 10 mg by mouth at bedtime as needed.    [provider]  DULoxetine (CYMBALTA) 60 MG capsule Take 60 mg by mouth 2 (two) times daily.  02/23/12  [provider]  esomeprazole (NEXIUM) 40 MG capsule Take 40 mg by mouth daily.    02/23/12  [provider]    Family History Family History  Problem Relation Age of Onset   Cancer Father        colon   Hypertension Father    Heart disease Father    Cancer Sister        Cervical   Cancer Brother        prostate    Heart disease Maternal Grandfather    Hypertension Paternal Grandmother    Heart disease Paternal Grandmother    Breast cancer Neg Hx     Social History Social History   Tobacco Use   Smoking status: Former    Current packs/day: 0.00    Average packs/day: 1 pack/day for 20.0 years (20.0 ttl pk-yrs)    Types: Cigarettes    Start date: 08/30/1979    Quit date: 08/30/1999    Years since quitting: 24.9    Passive exposure: Never   Smokeless tobacco: Never  Vaping Use   Vaping status: Never Used  Substance Use Topics   Alcohol use: No   Drug use: Never     Allergies   Sulfa antibiotics, Escitalopram, Metformin, and Sulfasalazine   Review of Systems Review of Systems  HENT:  Negative for ear discharge, ear pain, hearing loss and tinnitus.   Neurological:  Negative for dizziness and headaches.  All other systems reviewed and are negative.    Physical  Exam Triage Vital Signs ED Triage Vitals  Encounter Vitals Group     BP 08/01/24 1605 102/69     Girls Systolic BP Percentile --      Girls Diastolic BP Percentile --      Boys Systolic BP Percentile --      Boys Diastolic BP Percentile --      Pulse Rate 08/01/24 1605 76     Resp 08/01/24 1605 18     Temp 08/01/24 1605 98.5 F (36.9 C)     Temp Source 08/01/24 1605 Oral     SpO2 08/01/24 1605 95 %     Weight --      Height --      Head Circumference --      Peak Flow --      Pain Score 08/01/24 1606 0     Pain Loc --      Pain Education --      Exclude from Growth Chart --    No data found.  Updated Vital Signs BP 102/69 (BP Location: Left Arm)   Pulse 76   Temp 98.5 F (36.9 C) (Oral)   Resp 18   SpO2 95%   Visual Acuity Right Eye Distance:   Left Eye Distance:   Bilateral Distance:    Right Eye Near:   Left Eye Near:    Bilateral Near:     Physical Exam Vitals reviewed.  Constitutional:      General: She is awake. She is not in acute distress.    Appearance: Normal appearance. She is well-developed. She is not ill-appearing, toxic-appearing or diaphoretic.  HENT:     Head: Normocephalic.     Right Ear: Hearing normal. There is impacted cerumen.     Left Ear: Hearing normal. There is impacted cerumen.     Nose: Nose normal.     Mouth/Throat:     Mouth: Mucous membranes are moist.  Eyes:     General:  Vision grossly intact.     Conjunctiva/sclera: Conjunctivae normal.  Cardiovascular:     Rate and Rhythm: Normal rate and regular rhythm.     Heart sounds: Normal heart sounds.  Pulmonary:     Effort: Pulmonary effort is normal.     Breath sounds: Normal breath sounds and air entry.  Musculoskeletal:        General: Normal range of motion.     Cervical back: Full passive range of motion without pain, normal range of motion and neck supple.  Skin:    General: Skin is warm and dry.  Neurological:     General: No focal deficit present.     Mental  Status: She is alert and oriented to person, place, and time.  Psychiatric:        Speech: Speech normal.        Behavior: Behavior is cooperative.      UC Treatments / Results  Labs (all labs ordered are listed, but only abnormal results are displayed) Labs Reviewed - No data to display  EKG   Radiology No results found.  Procedures Ear Cerumen Removal  Date/Time: 08/01/2024 9:40 PM  Performed by: Maurice Juline HERO, CMA Authorized by: Iola Lukes, FNP   Consent:    Consent obtained:  Verbal   Consent given by:  Patient   Risks, benefits, and alternatives were discussed: yes     Risks discussed:  Bleeding, infection, pain, dizziness, incomplete removal and TM perforation Universal protocol:    Patient identity confirmed:  Verbally with patient and arm band Procedure details:    Location:  L ear and R ear   Procedure type: irrigation     Procedure outcomes: cerumen removed   Post-procedure details:    Inspection:  Ear canal clear, no bleeding and TM intact   Hearing quality:  Improved   Procedure completion:  Tolerated well, no immediate complications  (including critical care time)  Medications Ordered in UC Medications - No data to display  Initial Impression / Assessment and Plan / UC Course  I have reviewed the triage vital signs and the nursing notes.  Pertinent labs & imaging results that were available during my care of the patient were reviewed by me and considered in my medical decision making (see chart for details).     Patient presents for evaluation of possible cerumen impaction after being advised by an audiologist during her mother's visit earlier today. She denies ear pain, decreased hearing, drainage, dizziness, or other associated symptoms. Examination confirmed cerumen impaction bilaterally. Ear lavage was performed in clinic with successful removal of cerumen. No signs of infection or trauma noted. Patient tolerated the procedure well. No  further acute intervention required at this time.  Today's evaluation has revealed no signs of a dangerous process. Discussed diagnosis with patient and/or guardian. Patient and/or guardian aware of their diagnosis, possible red flag symptoms to watch out for and need for close follow up. Patient and/or guardian understands verbal and written discharge instructions. Patient and/or guardian comfortable with plan and disposition.  Patient and/or guardian has a clear mental status at this time, good insight into illness (after discussion and teaching) and has clear judgment to make decisions regarding their care  Documentation was completed with the aid of voice recognition software. Transcription may contain typographical errors. Final Clinical Impressions(s) / UC Diagnoses   Final diagnoses:  Bilateral impacted cerumen     Discharge Instructions      You were seen today earwax buildup. Earwax  actually helps protect your ears by keeping bacteria away and preventing damage to the skin inside your ears. However, when too much earwax accumulates, it can cause discomfort and make it harder to hear.You had your ear irrigated today, which has helped improve your symptoms. Be sure to use the ear drops that were prescribed to you. It's important not to put anything else in your ear, including Q-tips or cotton balls, as this can push the wax deeper or cause irritation. Also, try to keep water out of your ear, especially when showering or swimming. If you have any further concerns or if your symptoms persist, please follow up with your primary care provider.     ED Prescriptions   None    PDMP not reviewed this encounter.   Iola Lukes, [place] 08/01/24 2140

## 2024-09-14 HISTORY — PX: OTHER SURGICAL HISTORY: SHX169

## 2024-09-24 ENCOUNTER — Other Ambulatory Visit: Payer: Self-pay | Admitting: Physician Assistant

## 2024-09-26 NOTE — Telephone Encounter (Signed)
 Last Fill: 09/21/2023   Next Visit: 11/23/2024  Last Visit: 06/06/2024  DX: Other systemic lupus erythematosus with other organ involvement   Current Dose per office note on 06/06/2024: folic acid  1 mg po daily.   Okay to refill folic acid ?

## 2024-11-09 NOTE — Progress Notes (Signed)
 Office Visit Note  Patient: Deborah Levy             Date of Birth: Apr 22, 1956           MRN: [id number]             PCP: Evangelina Tinnie Norris, PA-C Referring: Evangelina Tinnie Brier* Visit Date: 11/23/2024 Occupation: Data Unavailable  Subjective:  Medication management  History of Present Illness: Deborah Levy is a 68 y.o. female with systemic and cutaneous lupus.  She returns today after her last visit in June 2025.  She continues to be on methotrexate  0.3 mL subcu weekly along with folic acid  1 mg p.o. daily.  She continues to have fatigue, dry mouth, dry eyes, arthralgias, hair loss, photosensitivity.  She states she has a rash under her right breast which burns and itch.  She states she was given a prescription for clotrimazole by her PCP.  She started using it today.  She has off-and-on discomfort in her right knee joint.  She states recently she has been having increased pain in her right knee.  Her bursitis symptoms have improved.    Activities of Daily Living:  Patient reports morning stiffness for 1.5-2 hours.   Patient Reports nocturnal pain.  Difficulty dressing/grooming: Denies Difficulty climbing stairs: Reports Difficulty getting out of chair: Denies Difficulty using hands for taps, buttons, cutlery, and/or writing: Denies  Review of Systems  Constitutional:  Positive for fatigue.  HENT:  Positive for mouth dryness. Negative for mouth sores.   Eyes:  Positive for dryness.  Respiratory:  Negative for shortness of breath.   Cardiovascular:  Negative for chest pain and palpitations.  Gastrointestinal:  Positive for constipation. Negative for blood in stool and diarrhea.  Endocrine: Positive for increased urination.  Genitourinary:  Negative for involuntary urination.  Musculoskeletal:  Positive for joint pain, joint pain, joint swelling, myalgias, muscle weakness, morning stiffness, muscle tenderness and myalgias. Negative for gait problem.  Skin:   Positive for rash, hair loss and sensitivity to sunlight. Negative for color change.  Allergic/Immunologic: Positive for susceptible to infections.  Neurological:  Positive for headaches. Negative for dizziness.  Hematological:  Negative for swollen glands.  Psychiatric/Behavioral:  Positive for sleep disturbance. Negative for depressed mood. The patient is not nervous/anxious.     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   Type 2 diabetes mellitus (HCC) 11/22/2014   Chronic pain 11/13/2014   Fibromyalgia 11/13/2014   Rotator cuff impingement syndrome 11/13/2014   Cannot sleep 02/13/2014   Essential (primary) hypertension 09/07/2013   HLD (hyperlipidemia) 09/07/2013   Adiposity 09/07/2013   Systemic lupus erythematosus (HCC) 05/21/2013   Hypokalemia 04/21/2013   Breast cancer of lower-inner quadrant of left female breast (HCC) 01/12/2012    Past Medical History:  Diagnosis Date   Breast cancer (HCC)    Cancer (HCC)    Fibromyalgia    GERD (gastroesophageal reflux disease)    Hypertension    Lupus    Osteoarthritis     Family History  Problem Relation Age of Onset   Cancer Father        colon   Hypertension Father  Heart disease Father    Cancer Sister        Cervical   Cancer Brother        prostate    Heart disease Maternal Grandfather    Hypertension Paternal Grandmother    Heart disease Paternal Grandmother    Breast cancer Neg Hx    Past Surgical History:  Procedure Laterality Date   BREAST LUMPECTOMY Left 2012   BREAST SURGERY  03/10/2011   Lt br lumpectomy   CATARACT EXTRACTION     08/2022 and 09/2022   COLONOSCOPY     Removed  2 polpys    KNEE SURGERY     SHOULDER SURGERY     skin cancer removal  09/2024   TUBAL LIGATION     Social History   Tobacco Use   Smoking status: Former    Current packs/day: 0.00    Average packs/day: 1 pack/day for 20.0 years (20.0 ttl pk-yrs)    Types: Cigarettes    Start date: 08/30/1979    Quit date: 08/30/1999    Years since quitting: 25.2    Passive exposure: Never   Smokeless tobacco: Never  Vaping Use   Vaping status: Never Used  Substance Use Topics   Alcohol use: No   Drug use: Never   Social History   Social History Narrative   Not on file     Immunization History  Administered Date(s) Administered   PFIZER(Purple Top)SARS-COV-2 Vaccination 03/04/2020, 03/25/2020, 12/03/2020   Pfizer Covid-19 Vaccine Bivalent Booster 104yrs & up 09/07/2021     Objective: Vital Signs: BP 114/74   Pulse 69   Temp 98.3 F (36.8 C)   Resp 16   Ht 4' 11 (1.499 m)   Wt 143 lb (64.9 kg)   BMI 28.88 kg/m    Physical Exam Vitals and nursing note reviewed.  Constitutional:      Appearance: She is well-developed.  HENT:     Head: Normocephalic and atraumatic.  Eyes:     Conjunctiva/sclera: Conjunctivae normal.  Cardiovascular:     Rate and Rhythm: Normal rate and regular rhythm.     Heart sounds: Normal heart sounds.  Pulmonary:     Effort: Pulmonary effort is normal.     Breath sounds: Normal breath sounds.  Abdominal:     General: Bowel sounds are normal.     Palpations: Abdomen is soft.  Musculoskeletal:     Cervical back: Normal range of motion.  Lymphadenopathy:     Cervical: No cervical adenopathy.  Skin:    General: Skin is warm and dry.     Capillary Refill: Capillary refill takes less than 2 seconds.  Neurological:     Mental Status: She is alert and oriented to person, place, and time.  Psychiatric:        Behavior: Behavior normal.      Musculoskeletal Exam: She had limited lateral rotation of the cervical spine.  There was no tenderness over  thoracic or lumbar spine.  Shoulders, elbows, wrist joints, MCPs and PIPs were in good range of motion.  PIP and DIP thickening was noted.  Hand joints were in good range of motion.  Knee joints in good range of motion without any warmth swelling or effusion.  There was no tenderness over ankles or MTPs.  CDAI Exam: CDAI Score: -- Patient Global: --; Provider Global: -- Swollen: --; Tender: -- Joint Exam 11/23/2024   No joint exam has been documented for this visit   There is currently no  information documented on the homunculus. Go to the Rheumatology activity and complete the homunculus joint exam.  Investigation: No additional findings.  Imaging: No results found.  Recent Labs: Lab Results  Component Value Date   WBC 5.3 06/06/2024   HGB 12.7 06/06/2024   PLT 271 06/06/2024   NA 141 12/15/2023   K 4.4 12/15/2023   CL 101 12/15/2023   CO2 30 12/15/2023   GLUCOSE 82 12/15/2023   BUN 15 12/15/2023   CREATININE 0.93 12/15/2023   BILITOT 0.4 12/15/2023   ALKPHOS 86 07/01/2017   AST 29 12/15/2023   ALT 29 12/15/2023   PROT 7.3 12/15/2023   ALBUMIN 4.3 07/01/2017   CALCIUM 9.5 12/15/2023   GFRAA 99 02/26/2021    Speciality Comments: No specialty comments available.  Procedures:  No procedures performed Allergies: Sulfa antibiotics, Escitalopram, Sulfamethoxazole, Metformin, and Sulfasalazine   Assessment / Plan:     Visit Diagnoses: Other systemic lupus erythematosus with other organ involvement (HCC) - Fatigue, arthralgia, malar rash, photosensitivity, positive subcutaneous lupus on biopsy, Positive ANA, Ro+ and La +: -Patient denies any history oral ulcers, nasal ulcers, malar rash, photosensitivity, Raynaud's or lymphadenopathy.  She continues to have arthralgias, fatigue, sicca symptoms and hair loss.  Labs obtained in June 2025 were positive for ANA.  All other labs were negative.  I will check labs today.  Plan: Protein / creatinine ratio, urine, ANA, Anti-DNA  antibody, double-stranded, C3 and C4, Sedimentation rate  High risk medication use - MTX 0.3 ml sq once weekly and folic acid  1 mg po daily. Reduced dose of MTX due to elevated LFTs. -She will get labs today.  She was advised to get labs every 3 months while she is on methotrexate .  Information reimmunization was placed in the AVS.  She was advised to hold methotrexate  if she develops infection.  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  Elevated serum creatinine-serum creatinine was elevated in the past which has been normal since then.  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  Trochanteric bursitis of both hips-she continues to have some IT band stiffness.  IT band stretches were discussed.  Primary osteoarthritis of right knee-been having increased discomfort in her knee joints.  Patient states she gets injections through orthopedics office.  She plans to have repeat injections through orthopedics under ultrasound guidance.  Primary osteoarthritis of left knee  Osteopenia of multiple sites - 04/10/21: DEXA done at [facility] showed a T-score of -1.3 in the left femoral neck.  Calcium rich diet and vitamin D  was discussed.  Vitamin D  deficiency-she takes vitamin D  supplement.  Candidiasis-she had candidiasis under her right breast.  She has a prescription for clotrimazole which she started using today.  I advised her to follow-up with her PCP.  Other medical problems are listed as follows:  Closed fracture of base of fifth metatarsal bone of left foot  Other fatigue  Primary insomnia  History of hypertension  History of diabetes mellitus  History of breast cancer  History of hyperlipidemia  OSA (obstructive sleep apnea)  History of gastroesophageal reflux (GERD)  History of depression  Orders: Orders Placed This  Encounter  Procedures   Protein / creatinine ratio, urine   CBC with Differential/Platelet   Comprehensive metabolic panel with GFR   ANA   Anti-DNA antibody, double-stranded   C3 and C4   Sedimentation rate  No orders of the defined types were placed in this encounter.    Follow-Up Instructions: Return in about 5 months (around 04/23/2025) for Systemic lupus.   Maya Nash, MD  Note - This record has been created using Animal nutritionist.  Chart creation errors have been sought, but may not always  have been located. Such creation errors do not reflect on  the standard of medical care.

## 2024-11-23 ENCOUNTER — Encounter: Payer: Self-pay | Admitting: Rheumatology

## 2024-11-23 ENCOUNTER — Ambulatory Visit: Attending: Rheumatology | Admitting: Rheumatology

## 2024-11-23 DIAGNOSIS — H35389 Toxic maculopathy, unspecified eye: Secondary | ICD-10-CM

## 2024-11-23 DIAGNOSIS — Z8679 Personal history of other diseases of the circulatory system: Secondary | ICD-10-CM

## 2024-11-23 DIAGNOSIS — M1712 Unilateral primary osteoarthritis, left knee: Secondary | ICD-10-CM

## 2024-11-23 DIAGNOSIS — M7061 Trochanteric bursitis, right hip: Secondary | ICD-10-CM

## 2024-11-23 DIAGNOSIS — F5101 Primary insomnia: Secondary | ICD-10-CM | POA: Diagnosis not present

## 2024-11-23 DIAGNOSIS — R5383 Other fatigue: Secondary | ICD-10-CM

## 2024-11-23 DIAGNOSIS — S92352A Displaced fracture of fifth metatarsal bone, left foot, initial encounter for closed fracture: Secondary | ICD-10-CM

## 2024-11-23 DIAGNOSIS — M1711 Unilateral primary osteoarthritis, right knee: Secondary | ICD-10-CM | POA: Diagnosis not present

## 2024-11-23 DIAGNOSIS — L932 Other local lupus erythematosus: Secondary | ICD-10-CM

## 2024-11-23 DIAGNOSIS — E559 Vitamin D deficiency, unspecified: Secondary | ICD-10-CM | POA: Diagnosis not present

## 2024-11-23 DIAGNOSIS — Z8659 Personal history of other mental and behavioral disorders: Secondary | ICD-10-CM

## 2024-11-23 DIAGNOSIS — Z79899 Other long term (current) drug therapy: Secondary | ICD-10-CM

## 2024-11-23 DIAGNOSIS — R7989 Other specified abnormal findings of blood chemistry: Secondary | ICD-10-CM

## 2024-11-23 DIAGNOSIS — Z8719 Personal history of other diseases of the digestive system: Secondary | ICD-10-CM

## 2024-11-23 DIAGNOSIS — G4733 Obstructive sleep apnea (adult) (pediatric): Secondary | ICD-10-CM

## 2024-11-23 DIAGNOSIS — B379 Candidiasis, unspecified: Secondary | ICD-10-CM | POA: Diagnosis not present

## 2024-11-23 DIAGNOSIS — Z853 Personal history of malignant neoplasm of breast: Secondary | ICD-10-CM

## 2024-11-23 DIAGNOSIS — M3219 Other organ or system involvement in systemic lupus erythematosus: Secondary | ICD-10-CM

## 2024-11-23 DIAGNOSIS — M7062 Trochanteric bursitis, left hip: Secondary | ICD-10-CM

## 2024-11-23 DIAGNOSIS — M8589 Other specified disorders of bone density and structure, multiple sites: Secondary | ICD-10-CM

## 2024-11-23 DIAGNOSIS — Z8639 Personal history of other endocrine, nutritional and metabolic disease: Secondary | ICD-10-CM

## 2024-11-23 DIAGNOSIS — T372X5A Adverse effect of antimalarials and drugs acting on other blood protozoa, initial encounter: Secondary | ICD-10-CM

## 2024-11-23 NOTE — Patient Instructions (Signed)
 Standing Labs We placed an order today for your standing lab work.   Please have your standing labs drawn in March and every 3 months  Please have your labs drawn 2 weeks prior to your appointment so that the provider can discuss your lab results at your appointment, if possible.  Please note that you may see your imaging and lab results in MyChart before we have reviewed them. We will contact you once all results are reviewed. Please allow our office up to 72 hours to thoroughly review all of the results before contacting the office for clarification of your results.  WALK-IN LAB HOURS  Monday through Thursday from 8:00 am - 4:30 pm and Friday from 8:00 am-12:00 pm.  Patients with office visits requiring labs will be seen before walk-in labs.  You may encounter longer than normal wait times. Please allow additional time. Wait times may be shorter on  Monday and Thursday afternoons.  We do not book appointments for walk-in labs. We appreciate your patience and understanding with our staff.   Labs are drawn by [facility]. Please bring your co-pay at the time of your lab draw.  You may receive a bill from [facility] for your lab work.  Please note if you are on Hydroxychloroquine and and an order has been placed for a Hydroxychloroquine level,  you will need to have it drawn 4 hours or more after your last dose.  If you wish to have your labs drawn at another location, please call the office 24 hours in advance so we can fax the orders.  The office is located at [address], [address], [place], [place] [address]   If you have any questions regarding directions or hours of operation,  please call [phone].   As a reminder, please drink plenty of water prior to coming for your lab work. Thanks!   Vaccines You are taking a medication(s) that can suppress your immune system.  The following immunizations are recommended: Flu annually Covid-19  RSV Td/Tdap (tetanus, diphtheria, pertussis)  every 10 years Pneumonia (Prevnar 15 then Pneumovax 23 at least 1 year apart.  Alternatively, can take Prevnar 20 without needing additional dose) Shingrix: 2 doses from 4 weeks to 6 months apart  Please check with your PCP to make sure you are up to date.   If you have signs or symptoms of an infection or start antibiotics: First, call your PCP for workup of your infection. Hold your medication through the infection, until you complete your antibiotics, and until symptoms resolve if you take the following: Injectable medication (Actemra, Benlysta, Cimzia, Cosentyx, Enbrel, Humira, Kevzara, Orencia, Remicade, Simponi, Stelara, Taltz, Tremfya) Methotrexate Leflunomide (Arava) Mycophenolate (Cellcept) Earma, Olumiant, or Rinvoq

## 2024-11-25 ENCOUNTER — Ambulatory Visit: Payer: Self-pay | Admitting: Rheumatology

## 2024-11-25 NOTE — Progress Notes (Signed)
 CBC and CMP are normal.  Urine protein creatinine ratio normal.  Double-stranded DNA negative, complements normal, sed rate normal.  ANA pending.  Labs do not indicate an autoimmune disease flare.

## 2024-11-26 LAB — COMPREHENSIVE METABOLIC PANEL WITH GFR
AG Ratio: 1.7 (calc) (ref 1.0–2.5)
ALT: 19 U/L (ref 6–29)
AST: 22 U/L (ref 10–35)
Albumin: 4.2 g/dL (ref 3.6–5.1)
Alkaline phosphatase (APISO): 76 U/L (ref 37–153)
BUN: 13 mg/dL (ref 7–25)
CO2: 33 mmol/L — ABNORMAL HIGH (ref 20–32)
Calcium: 9.1 mg/dL (ref 8.6–10.4)
Chloride: 100 mmol/L (ref 98–110)
Creat: 0.78 mg/dL (ref 0.60–1.00)
Globulin: 2.5 g/dL (ref 1.9–3.7)
Glucose, Bld: 79 mg/dL (ref 65–99)
Potassium: 3.9 mmol/L (ref 3.5–5.3)
Sodium: 139 mmol/L (ref 135–146)
Total Bilirubin: 0.5 mg/dL (ref 0.2–1.2)
Total Protein: 6.7 g/dL (ref 6.1–8.1)
eGFR: 82 mL/min/1.73m2 (ref >=60)

## 2024-11-26 LAB — C3 AND C4
C3 Complement: 146 mg/dL (ref 83–193)
C4 Complement: 29 mg/dL (ref 15–57)

## 2024-11-26 LAB — CBC WITH DIFFERENTIAL/PLATELET
Absolute Lymphocytes: 2047 {cells}/uL (ref 850–3900)
Absolute Monocytes: 447 {cells}/uL (ref 200–950)
Basophils Absolute: 41 {cells}/uL (ref 0–200)
Basophils Relative: 0.7 %
Eosinophils Absolute: 87 {cells}/uL (ref 15–500)
Eosinophils Relative: 1.5 %
HCT: 38.2 % (ref 35.9–46.0)
Hemoglobin: 12.8 g/dL (ref 11.7–15.5)
MCH: 30.3 pg (ref 27.0–33.0)
MCHC: 33.5 g/dL (ref 31.6–35.4)
MCV: 90.3 fL (ref 81.4–101.7)
MPV: 10.5 fL (ref 7.5–12.5)
Monocytes Relative: 7.7 %
Neutro Abs: 3178 {cells}/uL (ref 1500–7800)
Neutrophils Relative %: 54.8 %
Platelets: 337 Thousand/uL (ref 140–400)
RBC: 4.23 Million/uL (ref 3.80–5.10)
RDW: 13.4 % (ref 11.0–15.0)
Total Lymphocyte: 35.3 %
WBC: 5.8 Thousand/uL (ref 3.8–10.8)

## 2024-11-26 LAB — PROTEIN / CREATININE RATIO, URINE
Creatinine, Urine: 116 mg/dL (ref 20–275)
Protein/Creat Ratio: 138 mg/g{creat} (ref 24–184)
Protein/Creatinine Ratio: 0.138 mg/mg{creat} (ref 0.024–0.184)
Total Protein, Urine: 16 mg/dL (ref 5–24)

## 2024-11-26 LAB — ANA: Anti Nuclear Antibody (ANA): POSITIVE — AB

## 2024-11-26 LAB — ANTI-NUCLEAR AB-TITER (ANA TITER): ANA Titer 1: 1:320 {titer} — ABNORMAL HIGH

## 2024-11-26 LAB — SEDIMENTATION RATE: Sed Rate: 25 mm/h (ref 0–30)

## 2024-11-26 LAB — ANTI-DNA ANTIBODY, DOUBLE-STRANDED: ds DNA Ab: 1 [IU]/mL

## 2024-11-27 NOTE — Progress Notes (Signed)
 CBC normal, CMP normal, ANA positive and stable titer, double-stranded DNA negative, complements normal, sed rate normal urine protein creatinine ratio normal.  Labs do not indicate an autoimmune disease flare.

## 2024-12-19 ENCOUNTER — Other Ambulatory Visit: Payer: Self-pay | Admitting: Rheumatology

## 2024-12-19 NOTE — Telephone Encounter (Signed)
 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

## 2025-01-17 ENCOUNTER — Telehealth: Payer: Self-pay | Admitting: Rheumatology

## 2025-01-17 DIAGNOSIS — Z79899 Other long term (current) drug therapy: Secondary | ICD-10-CM

## 2025-01-17 DIAGNOSIS — M3219 Other organ or system involvement in systemic lupus erythematosus: Secondary | ICD-10-CM

## 2025-01-17 DIAGNOSIS — L659 Nonscarring hair loss, unspecified: Secondary | ICD-10-CM

## 2025-01-17 NOTE — Telephone Encounter (Signed)
 Patient called stating she has noticed that she has been losing hair lately.  Patient states she was looking up symptoms of hair loss and saw that it could be related to her thyroid.  Patient requested a return call to let her know if this is something Dr. Dolphus could test for.

## 2025-01-18 NOTE — Telephone Encounter (Signed)
 Patient advised We can check TSH with her next labs in March. Patient verbalized understanding. Will pend future lab orders now based on previous lab orders. Please review and edit as needed.

## 2025-01-18 NOTE — Telephone Encounter (Signed)
 We can check TSH with her next lab in March.

## 2025-04-26 ENCOUNTER — Ambulatory Visit: Admitting: Physician Assistant
